# Patient Record
Sex: Female | Born: 1950 | Race: Black or African American | Hispanic: No | Smoking: Never smoker
Health system: Southern US, Community
[De-identification: ages and names within clinical notes are randomized; demographics above are authoritative.]

## PROBLEM LIST (undated history)

## (undated) DIAGNOSIS — F329 Major depressive disorder, single episode, unspecified: Secondary | ICD-10-CM

## (undated) DIAGNOSIS — Z803 Family history of malignant neoplasm of breast: Secondary | ICD-10-CM

## (undated) DIAGNOSIS — C50919 Malignant neoplasm of unspecified site of unspecified female breast: Secondary | ICD-10-CM

## (undated) DIAGNOSIS — Z800 Family history of malignant neoplasm of digestive organs: Secondary | ICD-10-CM

## (undated) DIAGNOSIS — K589 Irritable bowel syndrome without diarrhea: Secondary | ICD-10-CM

## (undated) DIAGNOSIS — T4145XA Adverse effect of unspecified anesthetic, initial encounter: Secondary | ICD-10-CM

## (undated) DIAGNOSIS — I10 Essential (primary) hypertension: Secondary | ICD-10-CM

## (undated) DIAGNOSIS — T8859XA Other complications of anesthesia, initial encounter: Secondary | ICD-10-CM

## (undated) DIAGNOSIS — E785 Hyperlipidemia, unspecified: Secondary | ICD-10-CM

## (undated) DIAGNOSIS — R002 Palpitations: Secondary | ICD-10-CM

## (undated) DIAGNOSIS — K219 Gastro-esophageal reflux disease without esophagitis: Secondary | ICD-10-CM

## (undated) DIAGNOSIS — R42 Dizziness and giddiness: Secondary | ICD-10-CM

## (undated) HISTORY — DX: Irritable bowel syndrome, unspecified: K58.9

## (undated) HISTORY — DX: Major depressive disorder, single episode, unspecified: F32.9

## (undated) HISTORY — DX: Palpitations: R00.2

## (undated) HISTORY — DX: Hyperlipidemia, unspecified: E78.5

## (undated) HISTORY — DX: Family history of malignant neoplasm of digestive organs: Z80.0

## (undated) HISTORY — PX: COLONOSCOPY: SHX174

## (undated) HISTORY — DX: Malignant neoplasm of unspecified site of unspecified female breast: C50.919

## (undated) HISTORY — PX: OTHER SURGICAL HISTORY: SHX169

## (undated) HISTORY — DX: Gastro-esophageal reflux disease without esophagitis: K21.9

## (undated) HISTORY — DX: Essential (primary) hypertension: I10

## (undated) HISTORY — PX: TONSILLECTOMY: SUR1361

## (undated) HISTORY — DX: Family history of malignant neoplasm of breast: Z80.3

## (undated) HISTORY — DX: Dizziness and giddiness: R42

---

## 1983-03-17 HISTORY — PX: ABDOMINAL HYSTERECTOMY: SHX81

## 1997-08-21 ENCOUNTER — Encounter: Admission: RE | Admit: 1997-08-21 | Discharge: 1997-11-19 | Payer: Self-pay | Admitting: *Deleted

## 1998-06-07 ENCOUNTER — Other Ambulatory Visit: Admission: RE | Admit: 1998-06-07 | Discharge: 1998-06-07 | Payer: Self-pay | Admitting: Obstetrics and Gynecology

## 1999-06-10 ENCOUNTER — Other Ambulatory Visit: Admission: RE | Admit: 1999-06-10 | Discharge: 1999-06-10 | Payer: Self-pay | Admitting: Obstetrics and Gynecology

## 2000-06-24 ENCOUNTER — Other Ambulatory Visit: Admission: RE | Admit: 2000-06-24 | Discharge: 2000-06-24 | Payer: Self-pay | Admitting: Obstetrics and Gynecology

## 2001-06-28 ENCOUNTER — Other Ambulatory Visit: Admission: RE | Admit: 2001-06-28 | Discharge: 2001-06-28 | Payer: Self-pay | Admitting: Obstetrics and Gynecology

## 2001-10-09 ENCOUNTER — Emergency Department (HOSPITAL_COMMUNITY): Admission: EM | Admit: 2001-10-09 | Discharge: 2001-10-10 | Payer: Self-pay

## 2005-03-16 DIAGNOSIS — Z923 Personal history of irradiation: Secondary | ICD-10-CM | POA: Insufficient documentation

## 2005-03-16 HISTORY — DX: Personal history of irradiation: Z92.3

## 2005-03-16 HISTORY — PX: BREAST LUMPECTOMY: SHX2

## 2005-09-29 ENCOUNTER — Ambulatory Visit: Payer: Self-pay | Admitting: Internal Medicine

## 2005-10-06 ENCOUNTER — Ambulatory Visit: Payer: Self-pay | Admitting: Gastroenterology

## 2005-10-19 ENCOUNTER — Ambulatory Visit: Payer: Self-pay | Admitting: Internal Medicine

## 2006-03-16 DIAGNOSIS — C50919 Malignant neoplasm of unspecified site of unspecified female breast: Secondary | ICD-10-CM

## 2006-03-16 HISTORY — DX: Malignant neoplasm of unspecified site of unspecified female breast: C50.919

## 2006-04-02 ENCOUNTER — Encounter (INDEPENDENT_AMBULATORY_CARE_PROVIDER_SITE_OTHER): Payer: Self-pay | Admitting: *Deleted

## 2006-04-02 ENCOUNTER — Encounter (INDEPENDENT_AMBULATORY_CARE_PROVIDER_SITE_OTHER): Payer: Self-pay | Admitting: Radiology

## 2006-04-02 ENCOUNTER — Encounter: Admission: RE | Admit: 2006-04-02 | Discharge: 2006-04-02 | Payer: Self-pay | Admitting: Family Medicine

## 2006-04-12 ENCOUNTER — Encounter: Admission: RE | Admit: 2006-04-12 | Discharge: 2006-04-12 | Payer: Self-pay | Admitting: Family Medicine

## 2006-04-26 ENCOUNTER — Encounter: Admission: RE | Admit: 2006-04-26 | Discharge: 2006-04-26 | Payer: Self-pay | Admitting: Surgery

## 2006-04-27 ENCOUNTER — Encounter (INDEPENDENT_AMBULATORY_CARE_PROVIDER_SITE_OTHER): Payer: Self-pay | Admitting: Specialist

## 2006-04-27 ENCOUNTER — Ambulatory Visit (HOSPITAL_BASED_OUTPATIENT_CLINIC_OR_DEPARTMENT_OTHER): Admission: RE | Admit: 2006-04-27 | Discharge: 2006-04-27 | Payer: Self-pay | Admitting: Surgery

## 2006-04-27 ENCOUNTER — Encounter: Admission: RE | Admit: 2006-04-27 | Discharge: 2006-04-27 | Payer: Self-pay | Admitting: Surgery

## 2006-05-24 ENCOUNTER — Ambulatory Visit: Admission: RE | Admit: 2006-05-24 | Discharge: 2006-06-14 | Payer: Self-pay | Admitting: *Deleted

## 2006-06-15 ENCOUNTER — Ambulatory Visit: Payer: Self-pay | Admitting: Oncology

## 2006-06-15 ENCOUNTER — Ambulatory Visit: Admission: RE | Admit: 2006-06-15 | Discharge: 2006-08-01 | Payer: Self-pay | Admitting: *Deleted

## 2006-06-21 LAB — CBC WITH DIFFERENTIAL/PLATELET
Eosinophils Absolute: 0.1 10*3/uL (ref 0.0–0.5)
MONO#: 0.4 10*3/uL (ref 0.1–0.9)
MONO%: 8.9 % (ref 0.0–13.0)
NEUT#: 2.6 10*3/uL (ref 1.5–6.5)
RBC: 4.44 10*6/uL (ref 3.70–5.32)
RDW: 11.9 % (ref 11.3–14.5)
WBC: 4.9 10*3/uL (ref 3.9–10.0)
lymph#: 1.7 10*3/uL (ref 0.9–3.3)

## 2006-06-21 LAB — COMPREHENSIVE METABOLIC PANEL
Albumin: 4.6 g/dL (ref 3.5–5.2)
Alkaline Phosphatase: 72 U/L (ref 39–117)
CO2: 28 mEq/L (ref 19–32)
Glucose, Bld: 95 mg/dL (ref 70–99)
Potassium: 3.8 mEq/L (ref 3.5–5.3)
Sodium: 142 mEq/L (ref 135–145)
Total Protein: 7 g/dL (ref 6.0–8.3)

## 2006-09-20 ENCOUNTER — Ambulatory Visit: Payer: Self-pay | Admitting: Oncology

## 2006-11-05 ENCOUNTER — Ambulatory Visit: Payer: Self-pay | Admitting: Oncology

## 2006-11-09 ENCOUNTER — Encounter: Admission: RE | Admit: 2006-11-09 | Discharge: 2006-11-09 | Payer: Self-pay | Admitting: Oncology

## 2007-04-04 ENCOUNTER — Encounter: Admission: RE | Admit: 2007-04-04 | Discharge: 2007-04-04 | Payer: Self-pay | Admitting: Family Medicine

## 2007-08-03 ENCOUNTER — Ambulatory Visit: Payer: Self-pay | Admitting: Oncology

## 2007-08-03 ENCOUNTER — Encounter: Payer: Self-pay | Admitting: Internal Medicine

## 2008-04-04 ENCOUNTER — Encounter: Admission: RE | Admit: 2008-04-04 | Discharge: 2008-04-04 | Payer: Self-pay | Admitting: Oncology

## 2008-07-24 ENCOUNTER — Ambulatory Visit: Payer: Self-pay | Admitting: Oncology

## 2008-07-26 LAB — CBC WITH DIFFERENTIAL/PLATELET
Basophils Absolute: 0 10*3/uL (ref 0.0–0.1)
Eosinophils Absolute: 0.1 10*3/uL (ref 0.0–0.5)
HCT: 39 % (ref 34.8–46.6)
HGB: 13.5 g/dL (ref 11.6–15.9)
MCV: 89.6 fL (ref 79.5–101.0)
MONO%: 8.9 % (ref 0.0–14.0)
NEUT#: 2.8 10*3/uL (ref 1.5–6.5)
NEUT%: 49.5 % (ref 38.4–76.8)
RDW: 11.9 % (ref 11.2–14.5)
lymph#: 2.2 10*3/uL (ref 0.9–3.3)

## 2008-07-26 LAB — COMPREHENSIVE METABOLIC PANEL
Albumin: 4.1 g/dL (ref 3.5–5.2)
BUN: 14 mg/dL (ref 6–23)
Calcium: 9.7 mg/dL (ref 8.4–10.5)
Chloride: 104 mEq/L (ref 96–112)
Glucose, Bld: 99 mg/dL (ref 70–99)
Potassium: 3.9 mEq/L (ref 3.5–5.3)

## 2008-07-27 LAB — VITAMIN D 25 HYDROXY (VIT D DEFICIENCY, FRACTURES): Vit D, 25-Hydroxy: 38 ng/mL (ref 30–89)

## 2008-08-02 ENCOUNTER — Encounter: Payer: Self-pay | Admitting: Internal Medicine

## 2009-04-05 ENCOUNTER — Encounter: Admission: RE | Admit: 2009-04-05 | Discharge: 2009-04-05 | Payer: Self-pay | Admitting: Oncology

## 2009-06-22 LAB — HM COLONOSCOPY: HM Colonoscopy: NORMAL

## 2009-07-31 ENCOUNTER — Ambulatory Visit: Payer: Self-pay | Admitting: Oncology

## 2009-08-01 LAB — COMPREHENSIVE METABOLIC PANEL
BUN: 11 mg/dL (ref 6–23)
CO2: 27 mEq/L (ref 19–32)
Calcium: 9.4 mg/dL (ref 8.4–10.5)
Creatinine, Ser: 0.86 mg/dL (ref 0.40–1.20)
Glucose, Bld: 103 mg/dL — ABNORMAL HIGH (ref 70–99)
Total Bilirubin: 0.7 mg/dL (ref 0.3–1.2)

## 2009-08-01 LAB — CBC WITH DIFFERENTIAL/PLATELET
BASO%: 0.6 % (ref 0.0–2.0)
Basophils Absolute: 0 10*3/uL (ref 0.0–0.1)
Eosinophils Absolute: 0.1 10*3/uL (ref 0.0–0.5)
HCT: 40.2 % (ref 34.8–46.6)
HGB: 13.8 g/dL (ref 11.6–15.9)
LYMPH%: 39.5 % (ref 14.0–49.7)
MCHC: 34.3 g/dL (ref 31.5–36.0)
MONO#: 0.4 10*3/uL (ref 0.1–0.9)
NEUT%: 50.2 % (ref 38.4–76.8)
Platelets: 269 10*3/uL (ref 145–400)
WBC: 5.8 10*3/uL (ref 3.9–10.3)

## 2009-08-02 LAB — VITAMIN D 25 HYDROXY (VIT D DEFICIENCY, FRACTURES): Vit D, 25-Hydroxy: 27 ng/mL — ABNORMAL LOW (ref 30–89)

## 2009-08-08 ENCOUNTER — Encounter: Payer: Self-pay | Admitting: Internal Medicine

## 2010-01-20 ENCOUNTER — Ambulatory Visit: Payer: Self-pay | Admitting: Diagnostic Radiology

## 2010-01-20 ENCOUNTER — Emergency Department (HOSPITAL_BASED_OUTPATIENT_CLINIC_OR_DEPARTMENT_OTHER): Admission: EM | Admit: 2010-01-20 | Discharge: 2010-01-20 | Payer: Self-pay | Admitting: Emergency Medicine

## 2010-04-08 ENCOUNTER — Encounter
Admission: RE | Admit: 2010-04-08 | Discharge: 2010-04-08 | Payer: Self-pay | Source: Home / Self Care | Attending: Oncology | Admitting: Oncology

## 2010-04-15 NOTE — Letter (Signed)
[facility]   Imported By: Lester [place] 09/05/2009 09:01:56  _____________________________________________________________________  External Attachment:    Type:   Image     Comment:   External Document

## 2010-05-27 LAB — BASIC METABOLIC PANEL
Calcium: 10.5 mg/dL (ref 8.4–10.5)
Creatinine, Ser: 0.9 mg/dL (ref 0.4–1.2)
GFR calc Af Amer: >60 mL/min (ref >60)
GFR calc non Af Amer: >60 mL/min (ref >60)
Sodium: 149 mEq/L — ABNORMAL HIGH (ref 135–145)

## 2010-05-27 LAB — DIFFERENTIAL
Basophils Relative: 2 % — ABNORMAL HIGH (ref 0–1)
Eosinophils Absolute: 0.1 10*3/uL (ref 0.0–0.7)
Lymphs Abs: 2.2 10*3/uL (ref 0.7–4.0)
Monocytes Relative: 8 % (ref 3–12)
Neutro Abs: 3.1 10*3/uL (ref 1.7–7.7)
Neutrophils Relative %: 52 % (ref 43–77)

## 2010-05-27 LAB — POCT CARDIAC MARKERS
CKMB, poc: <1 ng/mL — ABNORMAL LOW (ref 1.0–8.0)
Myoglobin, poc: 69 ng/mL (ref 12–200)

## 2010-05-27 LAB — CBC
Hemoglobin: 14.9 g/dL (ref 12.0–15.0)
Platelets: 259 10*3/uL (ref 150–400)
RBC: 4.75 MIL/uL (ref 3.87–5.11)

## 2010-05-27 LAB — GLUCOSE, CAPILLARY: Glucose-Capillary: 124 mg/dL — ABNORMAL HIGH (ref 70–99)

## 2010-07-22 ENCOUNTER — Encounter (INDEPENDENT_AMBULATORY_CARE_PROVIDER_SITE_OTHER): Payer: Self-pay | Admitting: Surgery

## 2010-07-28 ENCOUNTER — Other Ambulatory Visit: Payer: Self-pay | Admitting: Oncology

## 2010-07-28 ENCOUNTER — Encounter (HOSPITAL_BASED_OUTPATIENT_CLINIC_OR_DEPARTMENT_OTHER): Payer: Managed Care, Other (non HMO) | Admitting: Oncology

## 2010-07-28 DIAGNOSIS — C50519 Malignant neoplasm of lower-outer quadrant of unspecified female breast: Secondary | ICD-10-CM

## 2010-07-28 DIAGNOSIS — Z170 Estrogen receptor positive status [ER+]: Secondary | ICD-10-CM

## 2010-07-28 LAB — CBC WITH DIFFERENTIAL/PLATELET
BASO%: 1.9 % (ref 0.0–2.0)
Eosinophils Absolute: 0.2 10*3/uL (ref 0.0–0.5)
MCHC: 33.3 g/dL (ref 31.5–36.0)
MCV: 91.3 fL (ref 79.5–101.0)
MONO#: 0.5 10*3/uL (ref 0.1–0.9)
MONO%: 8.2 % (ref 0.0–14.0)
NEUT#: 2.8 10*3/uL (ref 1.5–6.5)
RBC: 4.72 10*6/uL (ref 3.70–5.45)
RDW: 12.5 % (ref 11.2–14.5)
WBC: 5.7 10*3/uL (ref 3.9–10.3)

## 2010-07-28 LAB — COMPREHENSIVE METABOLIC PANEL
ALT: 21 U/L (ref 0–35)
Albumin: 4.3 g/dL (ref 3.5–5.2)
Alkaline Phosphatase: 69 U/L (ref 39–117)
Glucose, Bld: 94 mg/dL (ref 70–99)
Potassium: 3.6 mEq/L (ref 3.5–5.3)
Sodium: 140 mEq/L (ref 135–145)
Total Protein: 7.2 g/dL (ref 6.0–8.3)

## 2010-07-29 LAB — VITAMIN D 25 HYDROXY (VIT D DEFICIENCY, FRACTURES): Vit D, 25-Hydroxy: 29 ng/mL — ABNORMAL LOW (ref 30–89)

## 2010-08-01 NOTE — Procedures (Signed)
[facility]                                 ULTRASOUND STUDY   NAME:Katelyn Porter, Katelyn Porter                    MRN:          [id number]  DATE:10/06/2005                            DOB:          05-11-1950    Accession No. [id number].   ORDERING PHYSICIAN:  Wilhemina Bonito. Marina Goodell, MD   READING PHYSICIAN:  Malcom T. Russella Dar, MD, [facility]   PROCEDURE:  Multiplanar abdominal ultrasound imaging was performed in the  upright, supine, right and left lateral decubitus positions.   RESULTS:  Abdominal aorta 1.4 cm x 1.5 cm.  The IVC is patent.   The pancreas appears normal throughout the head, body and tail without  evidence of ductal dilatation, pancreatic masses, or peripancreatic  inflammation.   Gallbladder is well distended, thin walled, with no pericholecystic fluid or  intraluminal echogenic foci to suggest gallstone disease.  Wall thickness  1.6 mm.   The common bile duct measures 3.7 mm in maximal diameter without evidence of  intraluminal foci.   The liver appears normal without evidence of parenchymal lesion, ductal  dilatation or vascular abnormality.   Kidneys are normal in appearance.  The right kidney measures 6.3 cm.  The  left kidney measures 8.9 cm.   Spleen is normal in size, measuring 9 cm without parenchymal lesion.   IMPRESSION:  No abnormalities noted.   RECOMMENDATIONS:  Per Dr. Yancey Flemings.                                   Venita Lick. Pleas Koch., MD, [facility]   MTS/MedQ  DD:  10/06/2005  DT:  10/06/2005  Job #:  [id number]

## 2010-08-01 NOTE — Op Note (Signed)
[name], Katelyn Porter                   ACCOUNT NO.:  [id number]   MEDICAL RECORD NO.:  [id number]          PATIENT TYPE:  AMB   LOCATION:  DSC                          FACILITY:  [facility]   PHYSICIAN:  Thomas A. Cornett, M.D.DATE OF BIRTH:  1950-12-15   DATE OF PROCEDURE:  04/27/2006  DATE OF DISCHARGE:                               OPERATIVE REPORT   PREOPERATIVE DIAGNOSIS:  Left breast ductal carcinoma in situ.   POSTOPERATIVE DIAGNOSIS:  Left breast ductal carcinoma in situ.   PROCEDURE:  Left breast needle-localized excisional lumpectomy.   SURGEON:  Harriette Bouillon, M.D.   ANESTHESIA:  MAC with 0.25%  Sensorcaine with epinephrine.   ESTIMATED BLOOD LOSS:  20 mL.   SPECIMEN:  Left breast tissue with wire and clip to pathology plus  additional tissue corresponds to superior, inferior, medial and lateral  and deep margins.   DRAINS:  None.   INDICATIONS FOR PROCEDURE:  The patient is a 60 year old female found to  have left breast DCIS by mammography and core biopsy.  She elected to  undergo breast conserving therapy and presents today for left breast  needle-localized excisional biopsy.   DESCRIPTION OF PROCEDURE:  The patient was brought to the operating room  after undergoing left breast needle localization.  Left breast was then  prepped and draped in sterile fashion.  The wire was trimmed.  Local  anesthesia was injected and a curvilinear incision was made in the left  outer upper quadrant where the wire was.  The wire was grasped and then  pulled out through the incision.  The tissue around the wire was excised  with scalpel blade.  The entire specimen was oriented and sent to  radiology and verified to be adequate by the radiologist with clip and  wire in the specimen.  I then excised additional tissue corresponding to  superior, inferior, medial, lateral and deep margins.  I did not excise  any superficial tissue since this was quite thin up on the skin.  Hemostasis was  achieved with cautery.  The wound was closed in layers  with 3-0 Vicryl for a deep layer and 4-0 Monocryl for  subcuticular stitch.  Steri-Strips, dry dressings were applied.  All  final counts of sponge, needle and instruments were found to be correct  at this portion of the case.  The patient was then awoke, taken to  recovery in satisfactory condition.      Thomas A. Cornett, M.D.  Electronically Signed     TAC/MEDQ  D:  04/27/2006  T:  04/27/2006  Job:  [id number]   cc:   Rolan Bucco L. Kearney Hard, M.D.  Dr. Riley Nearing

## 2010-08-01 NOTE — Assessment & Plan Note (Signed)
[facility]                           GASTROENTEROLOGY OFFICE NOTE   NAME:Katelyn Porter, Katelyn Porter                    MRN:          [id number]  DATE:09/29/2005                            DOB:          25-Dec-1950    Patient is self-referred.  .   REASON FOR CONSULTATION:  Abdominal pain and nausea.   HISTORY:  This is a 60 year old African-American female with recently  diagnosed hyperlipidemia who refers herself for evaluation of abdominal pain  and nausea.  The patient reports a 3-4 month history of awakening with sharp  epigastric pain.  She states that when she steps onto the floor in the  morning, she develops sharp pain that takes her breath away.  It may radiate  to the right upper quadrant.  The pain occurs nearly every morning and only  in the morning and lasts for no more than 30 seconds.  It does not occur  more than once per day.  There are no obvious relieving or exacerbating  factors.  She also reports increased bloating and gas over the same time  frame as well as intermittent nausea.  She has had a 15 pound weight gain  over the past year, occasional heartburn for which she takes Pepcid, though  no dysphagia.  No melena or hematochezia.  She has not had prior history of  GI problems or GI evaluations.  She did see Dr. Riley Nearing recently, who  prescribed Nexium, which the patient took every other day for two weeks  without change in symptoms.  She did have laboratories September 09, 2005.  CBC,  comprehensive metabolic panel, urinalysis, and thyroid testing were normal.  Her cholesterol was elevated.   PAST MEDICAL HISTORY:  Hyperlipidemia.   PAST SURGICAL HISTORY:  Hysterectomy.  Ovaries present.   ALLERGIES:  SULFA.   CURRENT MEDICATIONS:  1.  Simvastatin 20 mg daily.  2.  Estradiol 2 mg daily.  3.  Nexium 40 mg every other day.   FAMILY HISTORY:  Mother recently diagnosed with colon cancer at age 20, as  well.  First cousin diagnosed  with colon cancer at age 74.  Mother and  brother with diabetes.   SOCIAL HISTORY:  Patient is married without children.  She lives with her  husband.  She works in Civil Service fast streamer for [employer].  She  does not smoke and rarely uses alcohol.   REVIEW OF SYSTEMS:  Per diagnostic evaluation form.   PHYSICAL EXAMINATION:  VITAL SIGNS:  Blood pressure is 112/60.  Heart rate  64.  Weight is 172 pounds.  She is 5 feet 4 inches in height.  GENERAL: A well-appearing female in no acute distress.  HEENT:  Sclerae are anicteric.  Conjunctivae are pink.  Oral mucosa intact.  NECK:  No adenopathy.  LUNGS:  Clear.  HEART:  Regular.  ABDOMEN:  Obese and soft without tenderness, mass, or hernia.  Good bowel  sounds heard.  No organomegaly.  EXTREMITIES:  Without edema.   IMPRESSION:  1.  This is a 59 year old female who presents with fleeting epigastric      discomfort as  well as recent problems with bloating, gas, and nausea.      Rule out pain related to intestinal gas, rule out reflux disease, rule      out gallbladder disease, rule out musculoskeletal or rib catch syndrome.  2.  Gastroesophageal reflux disease.  3.  Weight gain.  4.  Family history of colon cancer in a first and second degree relative.   RECOMMENDATIONS:  1.  Abdominal ultrasound.  2.  Anti-gas and flatulence dietary sheet as well as gas information      brochure.  3.  Weight loss.  4.  Colonoscopy with polypectomy if necessary.  The nature of the procedure      as well as the risks, benefits and alternatives have been reviewed.  She      understood and agreed to proceed.  5.  Ongoing general medical care with Dr. Riley Nearing.                                   Wilhemina Bonito. Eda Keys., MD   JNP/MedQ  DD:  09/29/2005  DT:  09/29/2005  Job #:  [id number]   cc:   Bernadette Hoit, M.D.

## 2010-08-05 ENCOUNTER — Encounter (HOSPITAL_BASED_OUTPATIENT_CLINIC_OR_DEPARTMENT_OTHER): Payer: Managed Care, Other (non HMO) | Admitting: Oncology

## 2010-08-05 DIAGNOSIS — Z170 Estrogen receptor positive status [ER+]: Secondary | ICD-10-CM

## 2010-08-05 DIAGNOSIS — D0590 Unspecified type of carcinoma in situ of unspecified breast: Secondary | ICD-10-CM

## 2011-03-05 ENCOUNTER — Other Ambulatory Visit: Payer: Self-pay | Admitting: Obstetrics & Gynecology

## 2011-03-05 DIAGNOSIS — Z1231 Encounter for screening mammogram for malignant neoplasm of breast: Secondary | ICD-10-CM

## 2011-04-10 ENCOUNTER — Ambulatory Visit: Payer: Managed Care, Other (non HMO)

## 2011-04-10 ENCOUNTER — Ambulatory Visit
Admission: RE | Admit: 2011-04-10 | Discharge: 2011-04-10 | Disposition: A | Discharge status: Home / Self Care | Payer: Managed Care, Other (non HMO) | Source: Ambulatory Visit | Attending: Obstetrics & Gynecology | Admitting: Obstetrics & Gynecology

## 2011-04-10 ENCOUNTER — Other Ambulatory Visit: Payer: Self-pay | Admitting: Obstetrics & Gynecology

## 2011-04-10 DIAGNOSIS — Z853 Personal history of malignant neoplasm of breast: Secondary | ICD-10-CM

## 2011-04-10 DIAGNOSIS — Z1231 Encounter for screening mammogram for malignant neoplasm of breast: Secondary | ICD-10-CM

## 2011-04-28 ENCOUNTER — Other Ambulatory Visit: Payer: Managed Care, Other (non HMO) | Admitting: Lab

## 2011-05-05 ENCOUNTER — Ambulatory Visit: Payer: Managed Care, Other (non HMO) | Admitting: Oncology

## 2011-06-01 ENCOUNTER — Other Ambulatory Visit (HOSPITAL_BASED_OUTPATIENT_CLINIC_OR_DEPARTMENT_OTHER): Payer: Managed Care, Other (non HMO)

## 2011-06-01 DIAGNOSIS — D0590 Unspecified type of carcinoma in situ of unspecified breast: Secondary | ICD-10-CM

## 2011-06-01 DIAGNOSIS — Z170 Estrogen receptor positive status [ER+]: Secondary | ICD-10-CM

## 2011-06-01 LAB — CBC WITH DIFFERENTIAL/PLATELET
Basophils Absolute: 0.1 10*3/uL (ref 0.0–0.1)
Eosinophils Absolute: 0.1 10*3/uL (ref 0.0–0.5)
HGB: 13.1 g/dL (ref 11.6–15.9)
MONO#: 0.5 10*3/uL (ref 0.1–0.9)
NEUT#: 2.4 10*3/uL (ref 1.5–6.5)
RBC: 4.31 10*6/uL (ref 3.70–5.45)
RDW: 12.3 % (ref 11.2–14.5)
WBC: 5.3 10*3/uL (ref 3.9–10.3)
lymph#: 2.1 10*3/uL (ref 0.9–3.3)

## 2011-06-01 LAB — COMPREHENSIVE METABOLIC PANEL
AST: 19 U/L (ref 0–37)
Albumin: 4.4 g/dL (ref 3.5–5.2)
BUN: 24 mg/dL — ABNORMAL HIGH (ref 6–23)
Calcium: 9.3 mg/dL (ref 8.4–10.5)
Chloride: 105 mEq/L (ref 96–112)
Glucose, Bld: 90 mg/dL (ref 70–99)
Potassium: 3.7 mEq/L (ref 3.5–5.3)
Sodium: 143 mEq/L (ref 135–145)
Total Protein: 6.2 g/dL (ref 6.0–8.3)

## 2011-06-04 ENCOUNTER — Ambulatory Visit (INDEPENDENT_AMBULATORY_CARE_PROVIDER_SITE_OTHER): Payer: Managed Care, Other (non HMO) | Admitting: Surgery

## 2011-06-04 ENCOUNTER — Encounter (INDEPENDENT_AMBULATORY_CARE_PROVIDER_SITE_OTHER): Payer: Self-pay | Admitting: Surgery

## 2011-06-04 VITALS — BP 116/72 | HR 64 | Ht 64.0 in | Wt 188.6 lb

## 2011-06-04 DIAGNOSIS — Z853 Personal history of malignant neoplasm of breast: Secondary | ICD-10-CM

## 2011-06-04 NOTE — Patient Instructions (Signed)
Congratulations! No further follow up needed.  Continue yearly mammograms.

## 2011-06-04 NOTE — Progress Notes (Signed)
NAME: [name]       DOB: 1950-10-16           DATE: 06/04/2011       MRN: [id number]   Katelyn Porter is a 61 y.o.Marland Kitchenfemale who presents for routine followup of her Left breast DCIS diagnosed in 2008 and treated with LUMPECTOMY ,  RADIATION AND TAMOXIFEN . She has no problems or concerns on either side.  PFSH: She has had no significant changes since the last visit here.  ROS: There have been no significant changes since the last visit here  EXAM: General: The patient is alert, oriented, generally healty appearing, NAD. Mood and affect are normal.  Breasts:  Left breast with post surgical changes.  No mass.Right breast normal  Lymphatics: She has no axillary or supraclavicular adenopathy on either side.  Extremities: Full ROM of the surgical side with no lymphedema noted.  Data Reviewed: Mammogram1/2013  BIRADS 2   Impression: Doing well, with no evidence of recurrent cancer or new cancer  Plan: Will continue to follow up prn.

## 2011-06-08 ENCOUNTER — Ambulatory Visit (HOSPITAL_BASED_OUTPATIENT_CLINIC_OR_DEPARTMENT_OTHER): Payer: Managed Care, Other (non HMO) | Admitting: Oncology

## 2011-06-08 VITALS — BP 133/77 | HR 86 | Temp 98.8°F | Ht 64.0 in | Wt 188.5 lb

## 2011-06-08 DIAGNOSIS — Z853 Personal history of malignant neoplasm of breast: Secondary | ICD-10-CM | POA: Insufficient documentation

## 2011-06-08 DIAGNOSIS — C50919 Malignant neoplasm of unspecified site of unspecified female breast: Secondary | ICD-10-CM

## 2011-06-08 NOTE — Progress Notes (Signed)
ID: Katelyn Porter   DOB: August 29, 1950  MR#: [id number]  YNW#:[id number]  HISTORY OF PRESENT ILLNESS: Katelyn Porter had a screening mammogram at [facility] March 24, 2006 which showed some microcalcifications in the left breast.  Diagnostic left mammogram on January 18 showed linear and branching characteristics suspicious for DCIS and biopsy was performed stereotactically the same day.  The pathology ([id number] and [id number]) showed a high-grade ductal carcinoma in situ which was strongly estrogen receptor positive at 96% and progesterone receptor positive at 85%.  It is difficult to tell from this biopsy how long the actual cancer was; the longest core measured 3.6 cm, but there were only patchy foci of high-grade DCIS in the sample.  With this information, the patient was referred to Dr. Luisa Hart and on January 28 underwent bilateral breast MRIs.  This showed only post-biopsy change in the lateral portion of the left breast with no other suspicious areas in either breast and, accordingly, on April 27, 2006, Dr. Luisa Hart proceeded to left breast needle localization lumpectomy.  The final pathology from that procedure ([id number]) showed no residual cancer in the sample.   INTERVAL HISTORY: Katelyn Porter lost her 61 year old mother February of this year and then her brother died earlier this month from a heart attack. She is actively and appropriately grieving these losses.  REVIEW OF SYSTEMS: From a breast cancer point of view, she is doing "pretty good". She still has hot flashes which she says "last all day". She is otherwise tolerating the tamoxifen with no unusual side effects She exercises mostly by walking, but in cold weather days she has a treadmill at home that she can use. She has had no unusual headaches, visual changes, cough, phlegm production, pleurisy, shortness of breath, chest pain or pressure, or change in bowel or bladder habits. A detailed review of systems is noncontributory  PAST MEDICAL  HISTORY: Past Medical History  Diagnosis Date  . Cancer   . Hypertension   . Hyperlipidemia   The past medical history is otherwise significant for hysterectomy performed in the late 1980s for fibroids.  No salpingo-oophorectomy was performed.  She is also status post tonsillectomy and adenoidectomy.  Aside the past medical history is entirely negative.  PAST SURGICAL HISTORY: Past Surgical History  Procedure Date  . Breast lumpectomy 2007  . Abdominal hysterectomy     FAMILY HISTORY Family History  Problem Relation Age of Onset  . Breast cancer    . Colon cancer    . Lung cancer Father   The patient's father had a history of tongue cancer which apparently spread to his lungs from the patient's account.  He died from myocardial infarction, however.  The patient's mother died at age 36.  The patient had 1 brother who died from an MI; and 1 sister, A&W.  There is some diabetes, emphysema and hypertension in the family, but no other breast cancer or ovarian cancer.  GYNECOLOGIC HISTORY: She is G0.  Of course, she has not had any periods since her hysterectomy.  She is still having significant hot flashes.  SOCIAL HISTORY: She works in Civil Service fast streamer for [employer].  Her husband of 15 years, [name], works in Editor, commissioning.  He has 2 children from a prior marriage, a daughter [name], 55, who works for [name] in [place].,, and a son, 61, [name], who lives in [place]. The patient has 3 stepgrandchildren but "I don't see them often". She attends [employer].      HEALTH MAINTENANCE:  History  Substance Use Topics  . Smoking status: Never Smoker   . Smokeless tobacco: Not on file  . Alcohol Use: No     Colonoscopy: 2008/[facility]  PAP: UTD  Bone density: 2007/[facility]/"normal"  Allergies  Allergen Reactions  . Sulfa Drugs Cross Reactors     Current Outpatient Prescriptions  Medication Sig Dispense Refill  . Cholecalciferol (VITAMIN D3) 1000 UNITS CAPS Take by  mouth.        . citalopram (CELEXA) 40 MG tablet       . Multiple Vitamin (MULTIVITAMIN) tablet Take 1 tablet by mouth daily.        . pravastatin (PRAVACHOL) 20 MG tablet Take 20 mg by mouth daily.        Marland Kitchen triamterene-hydrochlorothiazide (DYAZIDE) 37.5-25 MG per capsule Take 1 capsule by mouth every morning.        Marland Kitchen TRAMADOL HCL PO Take 20 mg by mouth daily.          OBJECTIVE: Middle-aged [place] American woman who appears well Filed Vitals:   06/08/11 1626  BP: 133/77  Pulse: 86  Temp: 98.8 F (37.1 C)     Body mass index is 32.36 kg/(m^2).    ECOG FS: 0  Sclerae unicteric Oropharynx clear No peripheral adenopathy Lungs no rales or rhonchi Heart regular rate and rhythm Abd benign MSK no focal spinal tenderness, no peripheral edema Neuro: nonfocal Breasts: The right breast is unremarkable; the left breast status post lumpectomy and radiation; there is some residual hyperpigmentation and firmness, but no evidence of local recurrence  LAB RESULTS: Lab Results  Component Value Date   WBC 5.3 06/01/2011   NEUTROABS 2.4 06/01/2011   HGB 13.1 06/01/2011   HCT 39.0 06/01/2011   MCV 90.4 06/01/2011   PLT 269 06/01/2011      Chemistry      Component Value Date/Time   NA 143 06/01/2011 1353   K 3.7 06/01/2011 1353   CL 105 06/01/2011 1353   CO2 24 06/01/2011 1353   BUN 24* 06/01/2011 1353   CREATININE 0.66 06/01/2011 1353      Component Value Date/Time   CALCIUM 9.3 06/01/2011 1353   ALKPHOS 75 06/01/2011 1353   AST 19 06/01/2011 1353   ALT 16 06/01/2011 1353   BILITOT 0.4 06/01/2011 1353       Lab Results  Component Value Date   LABCA2 30 07/28/2010   LABCA2 30 07/28/2010    No components found with this basename: [id number]    No results found for this basename: INR:1;PROTIME:1 in the last 168 hours  Urinalysis No results found for this basename: colorurine, appearanceur, labspec, phurine, glucoseu, hgbur, bilirubinur, ketonesur, proteinur, urobilinogen, nitrite,  leukocytesur    STUDIES: Clinical Data: Annual examination. History of left breast cancer,  diagnosed in 2008. The patient is asymptomatic.  DIGITAL DIAGNOSTIC BILATERAL MAMMOGRAM WITH CAD  Comparison: With priors  Findings: The breast parenchyma is heterogeneously dense  bilaterally. There are lumpectomy changes in the outer left breast  posteriorly. No mass, nonsurgical distortion, or suspicious  microcalcification is identified to suggest malignancy.  Mammographic images were processed with CAD.  IMPRESSION:  No evidence of malignancy in either breast. Lumpectomy changes on  the left. Bilateral diagnostic mammogram in 1 year is recommended.    ASSESSMENT: 61 year old [place] woman status post left lumpectomy February 2008 for a grade 3 ductal carcinoma in situ which was strongly estrogen and progesterone receptor positive.  She had radiation completed May 2008 and has been on  tamoxifen since July 2008 with good tolerance.   PLAN: We went over his situation in detail and she understands she had a noninvasive breast cancer. She has decrease the risk of its recurrence as well as the risk of a new breast cancer developing by taking tamoxifen for 5 years. At this point I am very comfortable releasing her from our followup and she is comfortable with that decision as well. As far as breast cancer screening is concerned all she will need is yearly mammography and a yearly physician breast exam. She does understand that she has dense breasts, and that these can make mammograms less sensitive. I have encouraged her to develop  breast self awareness and let her physicians know if she notices any change in either breast in the future.   Croy Drumwright C    06/08/2011

## 2011-12-16 ENCOUNTER — Encounter: Payer: Self-pay | Admitting: Internal Medicine

## 2012-01-18 ENCOUNTER — Encounter: Payer: Self-pay | Admitting: Cardiovascular Disease

## 2012-02-02 ENCOUNTER — Encounter: Payer: Self-pay | Admitting: *Deleted

## 2012-02-02 DIAGNOSIS — R002 Palpitations: Secondary | ICD-10-CM | POA: Insufficient documentation

## 2012-02-03 ENCOUNTER — Ambulatory Visit (INDEPENDENT_AMBULATORY_CARE_PROVIDER_SITE_OTHER): Payer: Managed Care, Other (non HMO) | Admitting: Cardiovascular Disease

## 2012-02-03 VITALS — BP 132/72 | HR 68 | Ht 64.0 in | Wt 196.0 lb

## 2012-02-03 DIAGNOSIS — I10 Essential (primary) hypertension: Secondary | ICD-10-CM

## 2012-02-03 DIAGNOSIS — C50919 Malignant neoplasm of unspecified site of unspecified female breast: Secondary | ICD-10-CM

## 2012-02-03 DIAGNOSIS — E785 Hyperlipidemia, unspecified: Secondary | ICD-10-CM

## 2012-02-03 DIAGNOSIS — R002 Palpitations: Secondary | ICD-10-CM

## 2012-02-03 NOTE — Assessment & Plan Note (Signed)
F/U Katelyn Porter S/P lumpectomy with good prognosis

## 2012-02-03 NOTE — Assessment & Plan Note (Signed)
Well controlled.  Continue current medications and low sodium Dash type diet.    

## 2012-02-03 NOTE — Patient Instructions (Signed)
Your physician recommends that you schedule a follow-up appointment in:  AS NEEDED  Your physician recommends that you continue on your current medications as directed. Please refer to the Current Medication list given to you today.  Your physician has requested that you have a stress echocardiogram. For further information please visit [email or web address]. Please follow instruction sheet as given. DX 785.1  

## 2012-02-03 NOTE — Progress Notes (Signed)
Patient ID: Katelyn Porter, female   DOB: Apr 12, 1950, 61 y.o.   MRN: [id number] 61 yo referred by Dr Darnelle Catalan for palpitations.  Brother died of MI last year CRF HTN and elevated lipids on Rx 3 weeks ago had a few days of raid palpitations.  Felt heart fluttering fast. No chest pain dyspnea or presyncope.  Improved now and actually better with activity.  Discussed weight loss and low carb diet.  She seems somewhat motivated to take better care of herself.  Compliant with meds.  No history of CAD, valve disease or CHF  She thinks Dr Hyacinth Meeker checked her thyroid this year  ROS: Denies fever, malais, weight loss, blurry vision, decreased visual acuity, cough, sputum, SOB, hemoptysis, pleuritic pain, palpitaitons, heartburn, abdominal pain, melena, lower extremity edema, claudication, or rash.  All other systems reviewed and negative   General: Affect appropriate Overweight black female HEENT: normal Neck supple with no adenopathy JVP normal no bruits no thyromegaly Lungs clear with no wheezing and good diaphragmatic motion Heart:  S1/S2 no murmur,rub, gallop or click PMI normal Abdomen: benighn, BS positve, no tenderness, no AAA no bruit.  No HSM or HJR Distal pulses intact with no bruits No edema Neuro non-focal Skin warm and dry No muscular weakness  Medications Current Outpatient Prescriptions  Medication Sig Dispense Refill  . Cholecalciferol (VITAMIN D3) 1000 UNITS CAPS Take 1 capsule by mouth daily.       . citalopram (CELEXA) 40 MG tablet Take 40 mg by mouth daily.       . Multiple Vitamin (MULTIVITAMIN) tablet Take 1 tablet by mouth daily.        . pravastatin (PRAVACHOL) 20 MG tablet Take 20 mg by mouth daily.        Marland Kitchen triamterene-hydrochlorothiazide (DYAZIDE) 37.5-25 MG per capsule Take 1 capsule by mouth every morning.          Allergies Sulfa drugs cross reactors  Family History: Family History  Problem Relation Age of Onset  . Breast cancer    . Colon cancer    . Lung  cancer Father     Social History: History   Social History  . Marital Status: Married    Spouse Name: N/A    Number of Children: N/A  . Years of Education: N/A   Occupational History  . Not on file.   Social History Main Topics  . Smoking status: Never Smoker   . Smokeless tobacco: Not on file  . Alcohol Use: No  . Drug Use: No  . Sexually Active:    Other Topics Concern  . Not on file   Social History Narrative  . No narrative on file    Electrocardiogram:  NSR rate 68 normal  Assessment and Plan

## 2012-02-03 NOTE — Assessment & Plan Note (Signed)
Cholesterol is at goal.  Continue current dose of statin and diet Rx.  No myalgias or side effects.  F/U  LFT's in 6 months. No results found for this basename: LDLCALC             

## 2012-02-03 NOTE — Assessment & Plan Note (Signed)
F/U stress echo to R/O exercise induced arrythmias and structural heart disease.  No need for event monitor at this time since symptoms improved.  Thyroid labs per primary

## 2012-02-12 ENCOUNTER — Encounter: Payer: Self-pay | Admitting: Cardiovascular Disease

## 2012-02-12 NOTE — Addendum Note (Signed)
Addended by: Burnett Kanaris A on: 02/12/2012 01:38 PM   Modules accepted: Orders

## 2012-02-26 ENCOUNTER — Ambulatory Visit (HOSPITAL_COMMUNITY): Payer: Managed Care, Other (non HMO) | Attending: Cardiology | Admitting: Radiology

## 2012-02-26 ENCOUNTER — Encounter: Payer: Self-pay | Admitting: Cardiovascular Disease

## 2012-02-26 ENCOUNTER — Ambulatory Visit (HOSPITAL_COMMUNITY): Payer: Managed Care, Other (non HMO) | Attending: Cardiology

## 2012-02-26 DIAGNOSIS — I10 Essential (primary) hypertension: Secondary | ICD-10-CM | POA: Insufficient documentation

## 2012-02-26 DIAGNOSIS — E785 Hyperlipidemia, unspecified: Secondary | ICD-10-CM | POA: Insufficient documentation

## 2012-02-26 DIAGNOSIS — R072 Precordial pain: Secondary | ICD-10-CM

## 2012-02-26 DIAGNOSIS — R0989 Other specified symptoms and signs involving the circulatory and respiratory systems: Secondary | ICD-10-CM

## 2012-02-26 DIAGNOSIS — Z8249 Family history of ischemic heart disease and other diseases of the circulatory system: Secondary | ICD-10-CM | POA: Insufficient documentation

## 2012-02-26 DIAGNOSIS — R002 Palpitations: Secondary | ICD-10-CM | POA: Insufficient documentation

## 2012-02-26 NOTE — Progress Notes (Signed)
Stress Echocardiogram performed.  

## 2012-04-19 ENCOUNTER — Other Ambulatory Visit: Payer: Self-pay | Admitting: Obstetrics & Gynecology

## 2012-04-19 DIAGNOSIS — Z9889 Other specified postprocedural states: Secondary | ICD-10-CM

## 2012-04-19 DIAGNOSIS — Z853 Personal history of malignant neoplasm of breast: Secondary | ICD-10-CM

## 2012-04-29 ENCOUNTER — Ambulatory Visit: Payer: Managed Care, Other (non HMO) | Admitting: Family

## 2012-05-02 ENCOUNTER — Ambulatory Visit
Admission: RE | Admit: 2012-05-02 | Discharge: 2012-05-02 | Disposition: A | Discharge status: Home / Self Care | Payer: Self-pay | Source: Ambulatory Visit | Attending: Obstetrics & Gynecology | Admitting: Obstetrics & Gynecology

## 2012-05-02 DIAGNOSIS — Z9889 Other specified postprocedural states: Secondary | ICD-10-CM

## 2012-05-02 DIAGNOSIS — Z853 Personal history of malignant neoplasm of breast: Secondary | ICD-10-CM

## 2012-05-02 LAB — HM MAMMOGRAPHY: HM Mammogram: NORMAL

## 2012-05-10 ENCOUNTER — Ambulatory Visit (INDEPENDENT_AMBULATORY_CARE_PROVIDER_SITE_OTHER): Payer: BC Managed Care – PPO | Admitting: Family

## 2012-05-10 ENCOUNTER — Encounter: Payer: Self-pay | Admitting: Family

## 2012-05-10 VITALS — BP 110/70 | HR 82 | Temp 97.7°F | Resp 16 | Ht 63.5 in | Wt 197.1 lb

## 2012-05-10 DIAGNOSIS — K5900 Constipation, unspecified: Secondary | ICD-10-CM

## 2012-05-10 DIAGNOSIS — R5381 Other malaise: Secondary | ICD-10-CM

## 2012-05-10 DIAGNOSIS — R232 Flushing: Secondary | ICD-10-CM | POA: Insufficient documentation

## 2012-05-10 DIAGNOSIS — N951 Menopausal and female climacteric states: Secondary | ICD-10-CM

## 2012-05-10 DIAGNOSIS — I10 Essential (primary) hypertension: Secondary | ICD-10-CM

## 2012-05-10 DIAGNOSIS — R109 Unspecified abdominal pain: Secondary | ICD-10-CM

## 2012-05-10 DIAGNOSIS — C50912 Malignant neoplasm of unspecified site of left female breast: Secondary | ICD-10-CM

## 2012-05-10 DIAGNOSIS — E785 Hyperlipidemia, unspecified: Secondary | ICD-10-CM

## 2012-05-10 DIAGNOSIS — C50919 Malignant neoplasm of unspecified site of unspecified female breast: Secondary | ICD-10-CM

## 2012-05-10 HISTORY — DX: Constipation, unspecified: K59.00

## 2012-05-10 LAB — CBC WITH DIFFERENTIAL/PLATELET
Basophils Relative: 0 % (ref 0–1)
HCT: 41.9 % (ref 36.0–46.0)
Hemoglobin: 14.3 g/dL (ref 12.0–15.0)
Lymphocytes Relative: 42 % (ref 12–46)
Lymphs Abs: 3 10*3/uL (ref 0.7–4.0)
Monocytes Absolute: 0.6 10*3/uL (ref 0.1–1.0)
Monocytes Relative: 8 % (ref 3–12)
Neutro Abs: 3.2 10*3/uL (ref 1.7–7.7)
Neutrophils Relative %: 47 % (ref 43–77)
RBC: 4.82 MIL/uL (ref 3.87–5.11)
WBC: 7 10*3/uL (ref 4.0–10.5)

## 2012-05-10 LAB — HEPATIC FUNCTION PANEL
Albumin: 4.5 g/dL (ref 3.5–5.2)
Alkaline Phosphatase: 84 U/L (ref 39–117)
Total Bilirubin: 0.7 mg/dL (ref 0.3–1.2)
Total Protein: 7 g/dL (ref 6.0–8.3)

## 2012-05-10 LAB — BASIC METABOLIC PANEL
CO2: 28 mEq/L (ref 19–32)
Calcium: 9.9 mg/dL (ref 8.4–10.5)
Creat: 0.89 mg/dL (ref 0.50–1.10)
Glucose, Bld: 88 mg/dL (ref 70–99)
Sodium: 138 mEq/L (ref 135–145)

## 2012-05-10 LAB — POCT URINALYSIS DIPSTICK
Blood, UA: NEGATIVE
Glucose, UA: NEGATIVE
Nitrite, UA: NEGATIVE
Protein, UA: NEGATIVE
Spec Grav, UA: 1.02
Urobilinogen, UA: 0.2

## 2012-05-10 NOTE — Assessment & Plan Note (Signed)
Obtain CBC/TSH

## 2012-05-10 NOTE — Assessment & Plan Note (Signed)
Continue citalopram. Not an estrogen candidate due to breast cancer hx.

## 2012-05-10 NOTE — Assessment & Plan Note (Signed)
BP stable on dyazide. Will obtain bmet.

## 2012-05-10 NOTE — Progress Notes (Signed)
Subjective:    Patient ID: Katelyn Porter, female    DOB: [birth year], 62 y.o.   MRN: [id number]  HPI  Katelyn Porter is a 62 yr old female who presents today to establish care.    Hot flashes/fatigue- Symptoms started in 2008 when she stopped estrogen due to breast cancer dx.  She is using citalopram which helps some.   Reports som lower abdominal tenderness for 2 weeks. Similar symptoms in the past with UTI. Denies dysuria. Hx of frequent UTI's and has seen urologist ([facility])  Breast cancer- treated in 2007. Had lumpectomy- left breast. Followed by radiation.  She was treated by Dr. Darnelle Catalan who released her after 5 yrs.  Last mammogram was 2/17- normal per pt.     HTN- pt is currently maintained on dyazide.  Hyperlipidemia- on pravastatin 20.  She denies myalgia.     Review of Systems  Constitutional:       Reports that she has gained 25 pounds in the last year  HENT: Negative for hearing loss.   Eyes: Negative for visual disturbance.  Respiratory: Negative for cough.   Cardiovascular: Negative for chest pain and leg swelling.  Gastrointestinal: Positive for constipation.  Genitourinary: Negative for dysuria.  Musculoskeletal:       + neck pain  Skin: Negative for rash.  Neurological: Negative for headaches.  Hematological: Negative for adenopathy.  Psychiatric/Behavioral:       Reports mild depression since death of her mother.    Past Medical History  Diagnosis Date  . Hypertension   . Hyperlipidemia   . Palpitations   . Cancer 2007    History   Social History  . Marital Status: Married    Spouse Name: N/A    Number of Children: 0  . Years of Education: N/A   Occupational History  .     Social History Main Topics  . Smoking status: Never Smoker   . Smokeless tobacco: Not on file  . Alcohol Use: No  . Drug Use: No  . Sexually Active: Not on file   Other Topics Concern  . Not on file   Social History Narrative   No children   Married   Enjoys  quilting and horseback riding.    She works in [employer].   She completed 2 years of college.     Past Surgical History  Procedure Laterality Date  . Breast lumpectomy  2007  . Abdominal hysterectomy  1985    Family History  Problem Relation Age of Onset  . Breast cancer    . Colon cancer    . Lung cancer Father     Died at 10 from lung cancer  . Heart disease Father   . Cancer Mother     colon cancer diagnosed at 25  . Hypertension Mother   . Kidney disease Mother   . Diabetes Mother   . Cancer Mother   . Coronary artery disease Brother 32    died of "massive heart attack"    Allergies  Allergen Reactions  . Sulfa Drugs Cross Reactors     Current Outpatient Prescriptions on File Prior to Visit  Medication Sig Dispense Refill  . citalopram (CELEXA) 40 MG tablet Take 40 mg by mouth daily.       . Multiple Vitamin (MULTIVITAMIN) tablet Take 1 tablet by mouth daily.        . pravastatin (PRAVACHOL) 20 MG tablet Take 20 mg by mouth daily.        Marland Kitchen  triamterene-hydrochlorothiazide (DYAZIDE) 37.5-25 MG per capsule Take 1 capsule by mouth every morning.        . Cholecalciferol (VITAMIN D3) 1000 UNITS CAPS Take 1 capsule by mouth daily.        No current facility-administered medications on file prior to visit.    BP 110/70  Pulse 82  Temp(Src) 97.7 F (36.5 C) (Oral)  Resp 16  Ht 5' 3.5" (1.613 m)  Wt 197 lb 1.3 oz (89.395 kg)  BMI 34.36 kg/m2  SpO2 96%  LMP 03/17/1983       Objective:   Physical Exam  Constitutional: She is oriented to person, place, and time. She appears well-developed and well-nourished. No distress.  Cardiovascular: Normal rate and regular rhythm.   No murmur heard. Pulmonary/Chest: Effort normal and breath sounds normal. No respiratory distress. She has no wheezes. She has no rales. She exhibits no tenderness.  Abdominal: Soft. Bowel sounds are normal. She exhibits no distension and no mass. There is no tenderness. There is no  rebound and no guarding.  Musculoskeletal: She exhibits no edema.  Neurological: She is alert and oriented to person, place, and time.  Psychiatric: She has a normal mood and affect. Her behavior is normal. Judgment and thought content normal.          Assessment & Plan:

## 2012-05-10 NOTE — Addendum Note (Signed)
Addended by: Mervin Kung A on: 05/10/2012 04:46 PM   Modules accepted: Orders

## 2012-05-10 NOTE — Assessment & Plan Note (Signed)
Likely cause for lower abdominal discomfort. Recommended prn miralax. Urine dip is unremakable. She took her PRN macrodantin that she had on hand.  Will sen urine for culture.

## 2012-05-10 NOTE — Patient Instructions (Addendum)
Complete lab work prior to leaving.  Please schedule fasting physical. Welcome to [facility]!

## 2012-05-10 NOTE — Assessment & Plan Note (Signed)
She is now continuing with annual screening mammogram and is up to date.

## 2012-05-10 NOTE — Assessment & Plan Note (Signed)
Tolerating statin, obtain lft.  

## 2012-05-11 ENCOUNTER — Encounter: Payer: Self-pay | Admitting: Family

## 2012-05-11 LAB — URINE CULTURE: Colony Count: 4000

## 2012-05-12 ENCOUNTER — Encounter: Payer: Self-pay | Admitting: Family

## 2012-05-31 ENCOUNTER — Other Ambulatory Visit: Payer: Self-pay | Admitting: Obstetrics & Gynecology

## 2012-05-31 MED ORDER — CITALOPRAM HYDROBROMIDE 40 MG PO TABS: 40.0000 mg | ORAL_TABLET | Freq: Every day | ORAL | Status: DC

## 2012-06-01 ENCOUNTER — Other Ambulatory Visit: Payer: Self-pay | Admitting: Obstetrics & Gynecology

## 2012-06-01 MED ORDER — CITALOPRAM HYDROBROMIDE 40 MG PO TABS: 40.0000 mg | ORAL_TABLET | Freq: Every day | ORAL | Status: DC

## 2012-06-22 ENCOUNTER — Encounter: Payer: Self-pay | Admitting: Family

## 2012-06-22 ENCOUNTER — Ambulatory Visit (HOSPITAL_BASED_OUTPATIENT_CLINIC_OR_DEPARTMENT_OTHER)
Admission: RE | Admit: 2012-06-22 | Discharge: 2012-06-22 | Disposition: A | Discharge status: Home / Self Care | Payer: BC Managed Care – PPO | Source: Ambulatory Visit | Attending: Family | Admitting: Family

## 2012-06-22 ENCOUNTER — Telehealth: Payer: Self-pay | Admitting: Family

## 2012-06-22 ENCOUNTER — Ambulatory Visit (INDEPENDENT_AMBULATORY_CARE_PROVIDER_SITE_OTHER): Payer: BC Managed Care – PPO | Admitting: Family

## 2012-06-22 ENCOUNTER — Other Ambulatory Visit: Payer: Self-pay | Admitting: Family

## 2012-06-22 VITALS — BP 126/78 | HR 79 | Temp 98.1°F | Resp 16 | Ht 63.5 in | Wt 196.1 lb

## 2012-06-22 DIAGNOSIS — Z0000 Encounter for general adult medical examination without abnormal findings: Secondary | ICD-10-CM

## 2012-06-22 DIAGNOSIS — Z853 Personal history of malignant neoplasm of breast: Secondary | ICD-10-CM | POA: Insufficient documentation

## 2012-06-22 DIAGNOSIS — R1011 Right upper quadrant pain: Secondary | ICD-10-CM | POA: Insufficient documentation

## 2012-06-22 DIAGNOSIS — R109 Unspecified abdominal pain: Secondary | ICD-10-CM

## 2012-06-22 DIAGNOSIS — R110 Nausea: Secondary | ICD-10-CM

## 2012-06-22 HISTORY — DX: Encounter for general adult medical examination without abnormal findings: Z00.00

## 2012-06-22 LAB — LIPID PANEL
Cholesterol: 209 mg/dL — ABNORMAL HIGH (ref 0–200)
HDL: 36 mg/dL — ABNORMAL LOW (ref >39)
LDL Cholesterol: 136 mg/dL — ABNORMAL HIGH (ref 0–99)
Triglycerides: 185 mg/dL — ABNORMAL HIGH (ref <150)
VLDL: 37 mg/dL (ref 0–40)

## 2012-06-22 MED ORDER — OMEPRAZOLE MAGNESIUM 20 MG PO TBEC: 20.0000 mg | DELAYED_RELEASE_TABLET | Freq: Every day | ORAL | Status: DC

## 2012-06-22 NOTE — Patient Instructions (Addendum)
Schedule bone density at the front desk. Schedule abdominal ultrasound in imaging department. Complete lab work prior to leaving. Check with insurance re: coverage for zostavax. Follow up in 6 months.

## 2012-06-22 NOTE — Progress Notes (Signed)
Subjective:    Patient ID: Katelyn Porter, female    DOB: 25-Sep-1950, 62 y.o.   MRN: [id number]  HPI  Katelyn Porter is a 62 yr old female who presents today for CPX.  Immunizations: tetanus up to date, interested in zostavax Diet: trying to improve Katelyn Porter diet.  Exercise: Not exercising Colonoscopy: 3 yrs ago- done at [facility]. Dexa: Reports last bone density years ago Pap Smear: s/p partial hysterectomy- follows with Dr. Leda Quail GYN Mammogram:  Up to date  Nausea/RUQ pain- reports multiple family members have had cholecystectomy.  Notes daily nausea, intermittent RUQ pain.  Review of Systems  Constitutional: Negative for unexpected weight change.  HENT: Negative for congestion.   Eyes: Negative for visual disturbance.  Respiratory: Negative for cough and shortness of breath.   Cardiovascular: Negative for chest pain and leg swelling.       Reports "flutters" in Katelyn Porter heart.  Had same symptoms during stress test and note was made of PVC's  Gastrointestinal: Positive for nausea. Negative for vomiting and diarrhea.       Some nausea in the middle of Katelyn Porter meal at least once a day.   Some abdominal pain Right upper quadrant intermittently.  Endocrine: Positive for heat intolerance.  Genitourinary: Negative for dysuria and frequency.  Neurological: Negative for headaches.  Hematological: Negative for adenopathy.  Psychiatric/Behavioral:       Denies depression or anxiety       Past Medical History  Diagnosis Date  . Hypertension   . Hyperlipidemia   . Palpitations   . Cancer 2007    History   Social History  . Marital Status: Married    Spouse Name: N/A    Number of Children: 0  . Years of Education: N/A   Occupational History  .     Social History Main Topics  . Smoking status: Never Smoker   . Smokeless tobacco: Not on file  . Alcohol Use: No  . Drug Use: No  . Sexually Active: Not on file   Other Topics Concern  . Not on file   Social History Narrative   No  children   Married   Enjoys quilting and horseback riding.    Katelyn Porter works in [employer].   Katelyn Porter completed 2 years of college.     Past Surgical History  Procedure Laterality Date  . Breast lumpectomy  2007  . Abdominal hysterectomy  1985    Family History  Problem Relation Age of Onset  . Breast cancer    . Colon cancer    . Lung cancer Father     Died at 13 from lung cancer  . Heart disease Father   . Cancer Mother     colon cancer diagnosed at 58  . Hypertension Mother   . Kidney disease Mother   . Diabetes Mother   . Cancer Mother   . Coronary artery disease Brother 58    died of "massive heart attack"    Allergies  Allergen Reactions  . Crab (Shellfish Allergy) Swelling    Blisters on tongue.  Gaetana Michaelis Drugs Cross Reactors     Current Outpatient Prescriptions on File Prior to Visit  Medication Sig Dispense Refill  . Cholecalciferol (VITAMIN D3) 1000 UNITS CAPS Take 1 capsule by mouth daily.       . citalopram (CELEXA) 40 MG tablet Take 1 tablet (40 mg total) by mouth daily.  90 tablet  3  . Multiple Vitamin (MULTIVITAMIN) tablet Take 1  tablet by mouth daily.        . pravastatin (PRAVACHOL) 20 MG tablet Take 20 mg by mouth daily.        Marland Kitchen triamterene-hydrochlorothiazide (DYAZIDE) 37.5-25 MG per capsule Take 1 capsule by mouth every morning.         No current facility-administered medications on file prior to visit.    BP 126/78  Pulse 79  Temp(Src) 98.1 F (36.7 C) (Oral)  Resp 16  Ht 5' 3.5" (1.613 m)  Wt 196 lb 1.9 oz (88.959 kg)  BMI 34.19 kg/m2  SpO2 97%  LMP 03/17/1983    Objective:   Physical Exam Physical Exam  Constitutional: Katelyn Porter is oriented to person, place, and time. Katelyn Porter appears well-developed and well-nourished. No distress.  HENT:  Head: Normocephalic and atraumatic.  Right Ear: Tympanic membrane and ear canal normal.  Left Ear: Tympanic membrane and ear canal normal.  Mouth/Throat: Oropharynx is clear and moist.  Eyes: Pupils  are equal, round, and reactive to light. No scleral icterus.  Neck: Normal range of motion. No thyromegaly present.  Cardiovascular: Normal rate and regular rhythm.   No murmur heard. Pulmonary/Chest: Effort normal and breath sounds normal. No respiratory distress. He has no wheezes. Katelyn Porter has no rales. Katelyn Porter exhibits no tenderness.  Abdominal: Soft. Bowel sounds are normal. He exhibits no distension and no mass. There is no tenderness. There is no rebound and no guarding.  Musculoskeletal: Katelyn Porter exhibits no edema.  Lymphadenopathy:    Katelyn Porter has no cervical adenopathy.  Neurological: Katelyn Porter is alert and oriented to person, place, and time. Katelyn Porter has normal reflexes. Katelyn Porter exhibits normal muscle tone. Coordination normal.  Skin: Skin is warm and dry.  Psychiatric: Katelyn Porter has a normal mood and affect. Katelyn Porter behavior is normal. Judgment and thought content normal.  Breasts: Examined lying Right: Without masses, retractions, discharge or axillary adenopathy.  Left: Without masses, retractions, discharge or axillary adenopathy. Scar noted left lateral breast from former lumpectomy Inguinal/mons: Normal without inguinal adenopathy  Pelvic: Deferred to GYN         Assessment & Plan:          Assessment & Plan:

## 2012-06-22 NOTE — Telephone Encounter (Signed)
Gallbladder normal per ultrasound.  I recommend that she add prilosec otc 20mg  once daily and call if symptoms worsen or if not improved in 1 week.

## 2012-06-22 NOTE — Assessment & Plan Note (Signed)
Pt counseled on healthy diet, exercise, weight loss. She is unsure if she had chicken pox as child. She is interested in zostavax. Will check varicella titer and if + plan nurse visit for zostavax.  Refer for bone density.

## 2012-06-22 NOTE — Assessment & Plan Note (Addendum)
Some associated RUQ pain.  Non-tender today, will obtain abdominal US to evaluate gallbladder. Add metamucil for constipation.

## 2012-06-22 NOTE — Telephone Encounter (Signed)
Patient informed, understood & agreed; new OTC medication added to med list/SLS

## 2012-06-23 ENCOUNTER — Telehealth: Payer: Self-pay | Admitting: Family

## 2012-06-23 LAB — VARICELLA ZOSTER ANTIBODY, IGG: Varicella IgG: 1085 Index — ABNORMAL HIGH (ref <135.00)

## 2012-06-23 NOTE — Telephone Encounter (Signed)
Received medical records from [facility]  P: [phone] F: [phone]

## 2012-06-25 ENCOUNTER — Telehealth: Payer: Self-pay | Admitting: Family

## 2012-06-25 DIAGNOSIS — E785 Hyperlipidemia, unspecified: Secondary | ICD-10-CM

## 2012-06-25 MED ORDER — PRAVASTATIN SODIUM 40 MG PO TABS: 40.0000 mg | ORAL_TABLET | Freq: Every day | ORAL | Status: DC

## 2012-06-25 NOTE — Telephone Encounter (Signed)
Pls call pt and let her know that her blood work shows that she did have chicken pox.  She can schedule nurse visit for zostavax at her convenience.  Also, cholesterol is a little bit above goal.  Increase pravastatin from 20mg  to 40mg .  Follow up flp/lft in 6 weeks.

## 2012-06-27 NOTE — Telephone Encounter (Signed)
Notified pt and she voices understanding. Future lab order entered. 

## 2012-06-28 ENCOUNTER — Inpatient Hospital Stay: Admission: RE | Admit: 2012-06-28 | Payer: BC Managed Care – PPO | Source: Ambulatory Visit

## 2012-10-27 ENCOUNTER — Telehealth: Payer: Self-pay | Admitting: *Deleted

## 2012-10-27 MED ORDER — PRAVASTATIN SODIUM 40 MG PO TABS: 40.0000 mg | ORAL_TABLET | Freq: Every day | ORAL | Status: DC

## 2012-10-27 NOTE — Telephone Encounter (Signed)
Pt left message on voicemail requesting refill on pravastatin. Refill sent and pt notified.

## 2012-12-20 ENCOUNTER — Ambulatory Visit: Payer: BC Managed Care – PPO | Admitting: Family

## 2013-01-20 ENCOUNTER — Other Ambulatory Visit: Payer: Self-pay | Admitting: Obstetrics & Gynecology

## 2013-01-20 NOTE — Telephone Encounter (Signed)
[facility] scheduled for 04/13/13   According to paper chart patient does have a history of recurrent uti's   Last given at [facility] 01/14/12 #30/5 refills  (Chart In Your Door)  Please advise

## 2013-01-24 ENCOUNTER — Ambulatory Visit (INDEPENDENT_AMBULATORY_CARE_PROVIDER_SITE_OTHER): Payer: BC Managed Care – PPO | Admitting: Family

## 2013-01-24 ENCOUNTER — Encounter: Payer: Self-pay | Admitting: Family

## 2013-01-24 VITALS — BP 136/80 | HR 68 | Temp 98.0°F | Resp 16 | Ht 63.5 in | Wt 201.0 lb

## 2013-01-24 DIAGNOSIS — N951 Menopausal and female climacteric states: Secondary | ICD-10-CM

## 2013-01-24 DIAGNOSIS — F3341 Major depressive disorder, recurrent, in partial remission: Secondary | ICD-10-CM | POA: Insufficient documentation

## 2013-01-24 DIAGNOSIS — F329 Major depressive disorder, single episode, unspecified: Secondary | ICD-10-CM

## 2013-01-24 DIAGNOSIS — F32A Depression, unspecified: Secondary | ICD-10-CM

## 2013-01-24 DIAGNOSIS — I10 Essential (primary) hypertension: Secondary | ICD-10-CM

## 2013-01-24 DIAGNOSIS — R232 Flushing: Secondary | ICD-10-CM

## 2013-01-24 DIAGNOSIS — K219 Gastro-esophageal reflux disease without esophagitis: Secondary | ICD-10-CM

## 2013-01-24 DIAGNOSIS — E785 Hyperlipidemia, unspecified: Secondary | ICD-10-CM

## 2013-01-24 HISTORY — DX: Major depressive disorder, single episode, unspecified: F32.9

## 2013-01-24 HISTORY — DX: Gastro-esophageal reflux disease without esophagitis: K21.9

## 2013-01-24 HISTORY — DX: Depression, unspecified: F32.A

## 2013-01-24 HISTORY — DX: Major depressive disorder, recurrent, in partial remission: F33.41

## 2013-01-24 LAB — BASIC METABOLIC PANEL WITH GFR
BUN: 18 mg/dL (ref 6–23)
CO2: 26 mEq/L (ref 19–32)
Calcium: 9.3 mg/dL (ref 8.4–10.5)
Creat: 0.91 mg/dL (ref 0.50–1.10)
GFR, Est Non African American: 68 mL/min

## 2013-01-24 LAB — HEPATIC FUNCTION PANEL
AST: 17 U/L (ref 0–37)
Albumin: 4.3 g/dL (ref 3.5–5.2)
Total Bilirubin: 0.8 mg/dL (ref 0.3–1.2)
Total Protein: 6.8 g/dL (ref 6.0–8.3)

## 2013-01-24 LAB — LIPID PANEL
Cholesterol: 253 mg/dL — ABNORMAL HIGH (ref 0–200)
Total CHOL/HDL Ratio: 6.3 Ratio

## 2013-01-24 MED ORDER — MELOXICAM 7.5 MG PO TABS: 7.5000 mg | ORAL_TABLET | Freq: Every day | ORAL | Status: DC
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
## 2013-01-24 NOTE — Patient Instructions (Signed)
Please complete your lab work prior to leaving. You will be contacted about your referral to the counselor.  Start meloxicam for your neck. Call if symptoms are not improved in 2 weeks.  Follow up in 3 month- sooner if problems/concerns.

## 2013-01-24 NOTE — Assessment & Plan Note (Signed)
Seems to be worsened by marital/relationship problems.  Not certain that med changes will help at this point. I recommended referral to a therapist for counseling. She is agreeable. Continue citalopram.

## 2013-01-24 NOTE — Assessment & Plan Note (Signed)
BP Readings from Last 3 Encounters:  01/24/13 136/80  06/22/12 126/78  05/10/12 110/70   BP is stable.  Continue current meds. Obtain bmet.

## 2013-01-24 NOTE — Assessment & Plan Note (Signed)
Pt finds that citalopram helps.  Monitor.

## 2013-01-24 NOTE — Assessment & Plan Note (Signed)
Tolerating statin. Obtain FLP/LFT, continue statin.

## 2013-01-24 NOTE — Assessment & Plan Note (Signed)
Stable with dietary changes and prn prilosec.

## 2013-01-26 ENCOUNTER — Telehealth: Payer: Self-pay | Admitting: Family

## 2013-01-26 DIAGNOSIS — E785 Hyperlipidemia, unspecified: Secondary | ICD-10-CM

## 2013-01-26 MED ORDER — ATORVASTATIN CALCIUM 40 MG PO TABS: 40.0000 mg | ORAL_TABLET | Freq: Every day | ORAL | Status: DC

## 2013-01-26 NOTE — Telephone Encounter (Signed)
Cholesterol is higher than last time.  I don't think pravastatin is strong enough for her. Will send lipitor to start in place of pravastatin.  Plan repeat FLP/LFT in 3 months. Dx hyperlipidemia.

## 2013-01-27 NOTE — Telephone Encounter (Signed)
Left detailed message on home/cell# re: below instructions and to call if any questions.  Future lab order placed.

## 2013-02-01 ENCOUNTER — Telehealth: Payer: Self-pay | Admitting: *Deleted

## 2013-02-01 NOTE — Telephone Encounter (Signed)
Received message from pt that she checked with her insurance re: referral to counseling and her insurance has very limited coverage. States it will be too expensive for pt to follow through with and wants to know if there is anything else she can do?

## 2013-02-02 NOTE — Telephone Encounter (Signed)
If she wants to adjust her antidepressant medication we can try that. I would add effexor and have her follow up in 1 month.  Let me know.

## 2013-02-03 MED ORDER — SERTRALINE HCL 50 MG PO TABS: 50.0000 mg | ORAL_TABLET | Freq: Every day | ORAL | Status: DC

## 2013-02-03 NOTE — Telephone Encounter (Signed)
Notified pt. She states she has been on Effexor in the past and it caused her to have vomiting on a daily basis. Has also tried zoloft and lexapro. Remembers that zoloft worked well for her in the past. Also states that she has had leg cramping since taking lipitor that she did not have before she started the medication.  She states she continues to try and take the medication but the cramping has been bad at times.  Please advise.

## 2013-02-03 NOTE — Telephone Encounter (Signed)
Notified pt and she voices understanding. 1 month follow up scheduled for 03/06/13 at 3:15 pm.

## 2013-02-03 NOTE — Telephone Encounter (Signed)
Since she seems to have done better in the past on zoloft, lets stop citalopram and start zoloft.   Stop lipitor.  Follow up in 1 month. We will re-evaluate re: cholesterol meds at that time.

## 2013-03-06 ENCOUNTER — Ambulatory Visit (INDEPENDENT_AMBULATORY_CARE_PROVIDER_SITE_OTHER): Payer: BC Managed Care – PPO | Admitting: Family

## 2013-03-06 ENCOUNTER — Encounter: Payer: Self-pay | Admitting: Family

## 2013-03-06 ENCOUNTER — Other Ambulatory Visit: Payer: Self-pay | Admitting: Family

## 2013-03-06 VITALS — BP 130/70 | HR 66 | Temp 98.6°F | Resp 16 | Ht 63.5 in | Wt 203.1 lb

## 2013-03-06 DIAGNOSIS — R232 Flushing: Secondary | ICD-10-CM

## 2013-03-06 DIAGNOSIS — N951 Menopausal and female climacteric states: Secondary | ICD-10-CM

## 2013-03-06 DIAGNOSIS — E785 Hyperlipidemia, unspecified: Secondary | ICD-10-CM

## 2013-03-06 DIAGNOSIS — E010 Iodine-deficiency related diffuse (endemic) goiter: Secondary | ICD-10-CM

## 2013-03-06 DIAGNOSIS — F329 Major depressive disorder, single episode, unspecified: Secondary | ICD-10-CM

## 2013-03-06 DIAGNOSIS — E049 Nontoxic goiter, unspecified: Secondary | ICD-10-CM

## 2013-03-06 LAB — HEPATIC FUNCTION PANEL
ALT: 22 U/L (ref 0–35)
Albumin: 4.1 g/dL (ref 3.5–5.2)
Indirect Bilirubin: 0.3 mg/dL (ref 0.0–0.9)
Total Protein: 6.5 g/dL (ref 6.0–8.3)

## 2013-03-06 NOTE — Progress Notes (Signed)
Pre visit review using our clinic review tool, if applicable. No additional management support is needed unless otherwise documented below in the visit note. 

## 2013-03-06 NOTE — Assessment & Plan Note (Signed)
Repeat TSH, ? Thyromegally, obtain thyroid US.

## 2013-03-06 NOTE — Assessment & Plan Note (Signed)
Now on pravastatin.  Continue same, obtain follow up lft, obtain FLP next visit when fasting.

## 2013-03-06 NOTE — Patient Instructions (Signed)
Please complete lab work prior to leaving. Schedule thyroid ultrasound on the first floor. Follow up in 3 months- come fasting to this appointment.

## 2013-03-06 NOTE — Assessment & Plan Note (Signed)
Improved on zoloft.

## 2013-03-06 NOTE — Progress Notes (Signed)
Subjective:    Patient ID: Katelyn Porter, female    DOB: [birth year], 62 y.o.   MRN: [id number]  HPI  Katelyn Porter is a 62 yr old female who presents today for follow up.  1) Depression- reports feeling much better.  Stopped citalopram, started zoloft.  Feels like she is better handling her stress.   2) Hyperlipidemia- Stopped lipitor.  She is now tolerating pravastatin without difficulty.    3) Hot flashes- has tried gabapentin without improvement.     Review of Systems See HPI  Past Medical History  Diagnosis Date  . Hypertension   . Hyperlipidemia   . Palpitations   . Cancer 2007  . Depression 01/24/2013    History   Social History  . Marital Status: Married    Spouse Name: N/A    Number of Children: 0  . Years of Education: N/A   Occupational History  .     Social History Main Topics  . Smoking status: Never Smoker   . Smokeless tobacco: Not on file  . Alcohol Use: No  . Drug Use: No  . Sexual Activity: Not on file   Other Topics Concern  . Not on file   Social History Narrative   No children   Married   Enjoys quilting and horseback riding.    She works in [employer].   She completed 2 years of college.     Past Surgical History  Procedure Laterality Date  . Breast lumpectomy  2007  . Abdominal hysterectomy  1985    Family History  Problem Relation Age of Onset  . Breast cancer    . Colon cancer    . Lung cancer Father     Died at 44 from lung cancer  . Heart disease Father   . Cancer Mother     colon cancer diagnosed at 60  . Hypertension Mother   . Kidney disease Mother   . Diabetes Mother   . Cancer Mother   . Coronary artery disease Brother 31    died of "massive heart attack"    Allergies  Allergen Reactions  . Crab [Shellfish Allergy] Swelling    Blisters on tongue.  . Lipitor [Atorvastatin]     Muscle cramping  . Sulfa Drugs Cross Reactors     Current Outpatient Prescriptions on File Prior to Visit  Medication Sig  Dispense Refill  . Calcium Carbonate-Vitamin D (CALTRATE 600+D) 600-400 MG-UNIT per tablet Take 1 tablet by mouth daily.      . Cholecalciferol (VITAMIN D3) 1000 UNITS CAPS Take 1 capsule by mouth daily.       . meloxicam (MOBIC) 7.5 MG tablet Take 1 tablet (7.5 mg total) by mouth daily.  14 tablet  0  . Multiple Vitamin (MULTIVITAMIN) tablet Take 1 tablet by mouth daily.        . nitrofurantoin, macrocrystal-monohydrate, (MACROBID) 100 MG capsule TAKE ONE CAPSULE BY MOUTH AS NEEDED      . omeprazole (PRILOSEC OTC) 20 MG tablet Take 20 mg by mouth daily as needed.      Marland Kitchen OVER THE COUNTER MEDICATION OTC Menopause supplement.  Take 1 tablet twice a day.      . sertraline (ZOLOFT) 50 MG tablet Take 1 tablet (50 mg total) by mouth daily.  30 tablet  0  . triamterene-hydrochlorothiazide (DYAZIDE) 37.5-25 MG per capsule Take 1 capsule by mouth every morning.         No current facility-administered medications on file  prior to visit.    BP 130/70  Pulse 66  Temp(Src) 98.6 F (37 C) (Oral)  Resp 16  Ht 5' 3.5" (1.613 m)  Wt 203 lb 1.3 oz (92.116 kg)  BMI 35.41 kg/m2  SpO2 98%  LMP 03/17/1983       Objective:   Physical Exam  Constitutional: She is oriented to person, place, and time. She appears well-developed and well-nourished. No distress.  HENT:  Head: Normocephalic and atraumatic.  Cardiovascular: Normal rate and regular rhythm.   No murmur heard. Pulmonary/Chest: Effort normal and breath sounds normal. No respiratory distress. She has no wheezes. She has no rales. She exhibits no tenderness.  Neurological: She is alert and oriented to person, place, and time.  Psychiatric: She has a normal mood and affect. Her behavior is normal. Judgment and thought content normal.          Assessment & Plan:

## 2013-03-07 ENCOUNTER — Ambulatory Visit (HOSPITAL_BASED_OUTPATIENT_CLINIC_OR_DEPARTMENT_OTHER)
Admission: RE | Admit: 2013-03-07 | Discharge: 2013-03-07 | Disposition: A | Discharge status: Home / Self Care | Payer: BC Managed Care – PPO | Source: Ambulatory Visit | Attending: Family | Admitting: Family

## 2013-03-07 DIAGNOSIS — E049 Nontoxic goiter, unspecified: Secondary | ICD-10-CM | POA: Insufficient documentation

## 2013-03-07 DIAGNOSIS — E010 Iodine-deficiency related diffuse (endemic) goiter: Secondary | ICD-10-CM

## 2013-03-08 ENCOUNTER — Encounter: Payer: Self-pay | Admitting: Family

## 2013-03-08 ENCOUNTER — Telehealth: Payer: Self-pay | Admitting: Family

## 2013-03-08 NOTE — Telephone Encounter (Signed)
Please call pt and let her know that thyroid ultrasound, liver function and thyroid function are all normal. No need to mail lab letter- thanks.

## 2013-03-08 NOTE — Telephone Encounter (Signed)
Notified pt and she voices understanding. 

## 2013-03-31 ENCOUNTER — Other Ambulatory Visit: Payer: Self-pay | Admitting: Family

## 2013-03-31 DIAGNOSIS — Z853 Personal history of malignant neoplasm of breast: Secondary | ICD-10-CM

## 2013-04-13 ENCOUNTER — Encounter: Payer: Self-pay | Admitting: Obstetrics & Gynecology

## 2013-04-13 ENCOUNTER — Ambulatory Visit (INDEPENDENT_AMBULATORY_CARE_PROVIDER_SITE_OTHER): Payer: BC Managed Care – PPO | Admitting: Obstetrics & Gynecology

## 2013-04-13 VITALS — BP 136/74 | HR 68 | Ht 62.5 in | Wt 202.0 lb

## 2013-04-13 DIAGNOSIS — Z01419 Encounter for gynecological examination (general) (routine) without abnormal findings: Secondary | ICD-10-CM

## 2013-04-13 DIAGNOSIS — Z0000 Encounter for general adult medical examination without abnormal findings: Secondary | ICD-10-CM

## 2013-04-13 DIAGNOSIS — E2839 Other primary ovarian failure: Secondary | ICD-10-CM

## 2013-04-13 LAB — POCT URINALYSIS DIPSTICK
BILIRUBIN UA: NEGATIVE
GLUCOSE UA: NEGATIVE
Ketones, UA: NEGATIVE
Nitrite, UA: NEGATIVE
PH UA: 5
Protein, UA: NEGATIVE
RBC UA: NEGATIVE
Urobilinogen, UA: NEGATIVE

## 2013-04-13 LAB — COMPREHENSIVE METABOLIC PANEL
ALK PHOS: 74 U/L (ref 39–117)
ALT: 16 U/L (ref 0–35)
AST: 15 U/L (ref 0–37)
Albumin: 4.4 g/dL (ref 3.5–5.2)
BILIRUBIN TOTAL: 0.6 mg/dL (ref 0.2–1.2)
BUN: 16 mg/dL (ref 6–23)
CO2: 30 meq/L (ref 19–32)
CREATININE: 0.94 mg/dL (ref 0.50–1.10)
Calcium: 9.6 mg/dL (ref 8.4–10.5)
Chloride: 101 mEq/L (ref 96–112)
Glucose, Bld: 81 mg/dL (ref 70–99)
Potassium: 3.9 mEq/L (ref 3.5–5.3)
SODIUM: 140 meq/L (ref 135–145)
TOTAL PROTEIN: 6.7 g/dL (ref 6.0–8.3)

## 2013-04-13 MED ORDER — CITALOPRAM HYDROBROMIDE 40 MG PO TABS: 40.0000 mg | ORAL_TABLET | Freq: Every day | ORAL | Status: DC

## 2013-04-13 MED ORDER — SERTRALINE HCL 50 MG PO TABS: 50.0000 mg | ORAL_TABLET | Freq: Every day | ORAL | Status: DC

## 2013-04-13 MED ORDER — NITROFURANTOIN MONOHYD MACRO 100 MG PO CAPS: ORAL_CAPSULE | ORAL | Status: DC

## 2013-04-13 NOTE — Progress Notes (Signed)
63 y.o. G1P0 MarriedAfrican AmericanF here for annual exam.  Says she just doesn't feel well.  Continues to have lots of hot flashes.  Husband with prostate cancer and very grouchy.  She was on Celexa and Dr. Inda Castle added Zoloft.  Pt felt better on both.  Now doesn't have rx for zoloft.    Pt using the macrobid for recurrent UTIs.  She has seen urology and had a negative evaluation with Dr. Terance Hart.  Cystoscopy was negative.  Hasn't had a recent UTI.  Not SA due to husband's issues.  Patient's last menstrual period was 03/17/1983.          Sexually active: no  The current method of family planning is status post hysterectomy.    Exercising: no  not in the last 3 months, was doing treadmill twice daily Smoker:  no  Health Maintenance: Pap:  2007 History of abnormal Pap:  no MMG:  05/02/12 diag in one year-scheduled in 3/15 Colonoscopy:  2012 repeat in 5 years BMD:   2007 TDaP:  2013 Screening Labs: PCP, Hb today: PCP, Urine today: WBC-1+   reports that she has never smoked. She has never used smokeless tobacco. She reports that she does not drink alcohol or use illicit drugs.  Past Medical History  Diagnosis Date  . Hypertension   . Hyperlipidemia   . Palpitations   . Cancer 2007  . Depression 01/24/2013  . Breast cancer 2008    with radiation finished 08, tamoxifen  . Vertigo     Past Surgical History  Procedure Laterality Date  . Breast lumpectomy  2007  . Abdominal hysterectomy  1985    Current Outpatient Prescriptions  Medication Sig Dispense Refill  . Calcium Carbonate-Vitamin D (CALTRATE 600+D) 600-400 MG-UNIT per tablet Take 1 tablet by mouth daily.      . Cholecalciferol (VITAMIN D3) 1000 UNITS CAPS Take 1 capsule by mouth daily.       . citalopram (CELEXA) 40 MG tablet 40 mg daily.      . meloxicam (MOBIC) 7.5 MG tablet Take 1 tablet (7.5 mg total) by mouth daily.  14 tablet  0  . Multiple Vitamin (MULTIVITAMIN) tablet Take 1 tablet by mouth daily.        .  nitrofurantoin, macrocrystal-monohydrate, (MACROBID) 100 MG capsule TAKE ONE CAPSULE BY MOUTH AS NEEDED      . pravastatin (PRAVACHOL) 40 MG tablet 40 mg daily.      . sertraline (ZOLOFT) 50 MG tablet Take 1 tablet (50 mg total) by mouth daily.  30 tablet  0  . triamterene-hydrochlorothiazide (DYAZIDE) 37.5-25 MG per capsule Take 1 capsule by mouth every morning.        Marland Kitchen omeprazole (PRILOSEC OTC) 20 MG tablet Take 20 mg by mouth daily as needed.       No current facility-administered medications for this visit.    Family History  Problem Relation Age of Onset  . Breast cancer    . Colon cancer    . Lung cancer Father     Died at 50 from lung cancer  . Heart disease Father   . Hypertension Mother   . Kidney disease Mother   . Diabetes Mother   . Cancer Mother     colon cancer diagnosed at 49  . Coronary artery disease Brother 62    died of "massive heart attack"  . Diabetes Brother   . Hypertension Sister     ROS:  Pertinent items are noted in HPI.  Otherwise, a comprehensive ROS was negative.  Exam:   BP 136/74  Pulse 68  Ht 5' 2.5" (1.588 m)  Wt 202 lb (91.627 kg)  BMI 36.33 kg/m2  LMP 03/17/1983  Weight change: +8 Height: 5' 2.5" (158.8 cm)  Ht Readings from Last 3 Encounters:  04/13/13 5' 2.5" (1.588 m)  03/06/13 5' 3.5" (1.613 m)  01/24/13 5' 3.5" (1.613 m)    General appearance: alert, cooperative and appears stated age Head: Normocephalic, without obvious abnormality, atraumatic Neck: no adenopathy, supple, symmetrical, trachea midline and thyroid normal to inspection and palpation Lungs: clear to auscultation bilaterally Breasts: normal appearance, no masses or tenderness, well healed scar on left breast Heart: regular rate and rhythm Abdomen: soft, non-tender; bowel sounds normal; no masses,  no organomegaly Extremities: extremities normal, atraumatic, no cyanosis or edema Skin: Skin color, texture, turgor normal. No rashes or lesions Lymph nodes: Cervical,  supraclavicular, and axillary nodes normal. No abnormal inguinal nodes palpated Neurologic: Grossly normal   Pelvic: External genitalia:  no lesions              Urethra:  normal appearing urethra with no masses, tenderness or lesions              Bartholins and Skenes: normal                 Vagina: normal appearing vagina with normal color and discharge, no lesions              Cervix: absent              Pap taken: no Bimanual Exam:  Uterus:  uterus absent              Adnexa: no mass, fullness, tenderness               Rectovaginal: Confirms               Anus:  normal sphincter tone, no lesions  A:  Well Woman with normal exam Breast cancer 2008, released from Dr. [name] Family hx of colon cancer (mother age 12) Hypertension Elevated lipids Recurrent UTIs--negative urology evaluation with Dr. Terance Hart 2011. H/O TAH 1985, ovaries remain  P:   Mammogram yearly.  Does diagnostic. pap smear not indicated CMP and Vit D and TSH Needs BMD with next MMG.  Order placed.  Continue Zoloft 50mg  with Celexa 40mg .  Risks of Serotonin syndrome discussed.  Pt knows to call with any new issues/ problems and to stop Zoloft immediately with abnormal side effects.  Voices understanding. return annually or prn  An After Visit Summary was printed and given to the patient.

## 2013-04-14 LAB — TSH: TSH: 1.02 u[IU]/mL (ref 0.350–4.500)

## 2013-04-14 LAB — VITAMIN D 25 HYDROXY (VIT D DEFICIENCY, FRACTURES): Vit D, 25-Hydroxy: 36 ng/mL (ref 30–89)

## 2013-04-14 NOTE — Patient Instructions (Signed)

## 2013-04-18 ENCOUNTER — Telehealth: Payer: Self-pay

## 2013-04-18 NOTE — Telephone Encounter (Signed)
Message copied by Robley Fries on Tue Apr 18, 2013  1:34 PM ------      Message from: Megan Salon      Created: Tue Apr 18, 2013  6:49 AM       Inform all labs are normal. ------

## 2013-04-18 NOTE — Telephone Encounter (Signed)
Patient returning Kelly's call. °

## 2013-04-18 NOTE — Telephone Encounter (Signed)
[email or web address]

## 2013-04-19 NOTE — Telephone Encounter (Signed)
Patient is returning a call to [facility]

## 2013-04-21 NOTE — Telephone Encounter (Signed)
Patient is calling kelly back °

## 2013-04-21 NOTE — Telephone Encounter (Signed)
Patient notified of all results. 

## 2013-04-24 ENCOUNTER — Ambulatory Visit: Payer: BC Managed Care – PPO | Admitting: Family

## 2013-05-08 ENCOUNTER — Encounter: Payer: Self-pay | Admitting: Family

## 2013-05-08 ENCOUNTER — Ambulatory Visit (INDEPENDENT_AMBULATORY_CARE_PROVIDER_SITE_OTHER): Payer: BC Managed Care – PPO | Admitting: Family

## 2013-05-08 VITALS — BP 126/82 | HR 70 | Temp 98.3°F | Resp 16 | Ht 63.5 in | Wt 204.1 lb

## 2013-05-08 DIAGNOSIS — I10 Essential (primary) hypertension: Secondary | ICD-10-CM

## 2013-05-08 DIAGNOSIS — K219 Gastro-esophageal reflux disease without esophagitis: Secondary | ICD-10-CM

## 2013-05-08 DIAGNOSIS — F329 Major depressive disorder, single episode, unspecified: Secondary | ICD-10-CM

## 2013-05-08 DIAGNOSIS — F32A Depression, unspecified: Secondary | ICD-10-CM

## 2013-05-08 DIAGNOSIS — E785 Hyperlipidemia, unspecified: Secondary | ICD-10-CM

## 2013-05-08 DIAGNOSIS — F3289 Other specified depressive episodes: Secondary | ICD-10-CM

## 2013-05-08 LAB — LIPID PANEL
CHOLESTEROL: 191 mg/dL (ref 0–200)
HDL: 40 mg/dL (ref >39)
LDL Cholesterol: 112 mg/dL — ABNORMAL HIGH (ref 0–99)
Total CHOL/HDL Ratio: 4.8 Ratio
Triglycerides: 193 mg/dL — ABNORMAL HIGH (ref <150)
VLDL: 39 mg/dL (ref 0–40)

## 2013-05-08 MED ORDER — PRAVASTATIN SODIUM 40 MG PO TABS: 40.0000 mg | ORAL_TABLET | Freq: Every day | ORAL | Status: DC

## 2013-05-08 MED ORDER — TRIAMTERENE-HCTZ 37.5-25 MG PO CAPS: 1.0000 | ORAL_CAPSULE | ORAL | Status: DC

## 2013-05-08 MED ORDER — SERTRALINE HCL 50 MG PO TABS: 50.0000 mg | ORAL_TABLET | Freq: Every day | ORAL | Status: DC

## 2013-05-08 NOTE — Assessment & Plan Note (Signed)
Stable.  Continue Prilosec.

## 2013-05-08 NOTE — Assessment & Plan Note (Signed)
Stable. Reports improvement. Continue Zoloft.

## 2013-05-08 NOTE — Patient Instructions (Signed)
Please complete lab work prior to leaving. Follow up in 3 months.  

## 2013-05-08 NOTE — Assessment & Plan Note (Addendum)
Stable. Continue same medications. CMET was normal in January 2015. Counseled patient on diet and exercise, with increasing exercise to 30 min 5 days per week.

## 2013-05-08 NOTE — Assessment & Plan Note (Signed)
Stable on statin, denies myalgias. LFT's normal in late January 2015. Will obtain fasting lipid panel today.

## 2013-05-08 NOTE — Progress Notes (Signed)
Subjective:    Patient ID: Katelyn Porter, female    DOB: Jul 15, 1950, 63 y.o.   MRN: [id number]  Hypertension Associated symptoms include palpitations and shortness of breath. Pertinent negatives include no chest pain or headaches.  Hyperlipidemia Associated symptoms include shortness of breath. Pertinent negatives include no chest pain or myalgias.   Katelyn Porter is a 63 year old female who presents today for follow up.  Hypertension) Denies chest pain and shortness of breath, Reports occasional, intermittent palpitations once daily when at rest. Patient reports she last experienced these palpitations yesterday and states that she feels short of breath during the episode which lasts a few seconds. Patient was followed by Cardiology one year ago and had stress echo which was normal.  BP Readings from Last 3 Encounters:  05/08/13 126/82  04/13/13 136/74  03/06/13 130/70    Hyperlipidemia) Patient reports poor diet consisting of carbohydrates and occasional fast food. Patient reports exercising on treadmill for 15 minutes three times weekly. Patient was started on Zocor last visit for high cholesterol.  Thyromegaly) Denies difficulty swallowing, cold intolerance, and fatigue. Thyroid ultrasound showed normal sized thyroid.   Depression) Patient continues zoloft.  Patient reports mood has improved and is feeling better.   Review of Systems  Constitutional: Negative for activity change, appetite change and fatigue.  Respiratory: Positive for shortness of breath.        Intermittent SOB when experiencing palpitations.  Cardiovascular: Positive for palpitations. Negative for chest pain and leg swelling.  Endocrine: Negative for cold intolerance.  Musculoskeletal: Negative for myalgias.  Neurological: Negative for dizziness, numbness and headaches.  Psychiatric/Behavioral: Positive for sleep disturbance. Negative for suicidal ideas.       Patient reports difficulty staying asleep, denies  waking gasping for air.  Denies depression, reports Zoloft is working.   Past Medical History  Diagnosis Date  . Hypertension   . Hyperlipidemia   . Palpitations   . Cancer 2007  . Depression 01/24/2013  . Breast cancer 2008    with radiation finished 08, tamoxifen  . Vertigo     History   Social History  . Marital Status: Married    Spouse Name: N/A    Number of Children: 0  . Years of Education: N/A   Occupational History  .     Social History Main Topics  . Smoking status: Never Smoker   . Smokeless tobacco: Never Used  . Alcohol Use: No  . Drug Use: No  . Sexual Activity: No     Comment: TAH   Other Topics Concern  . Not on file   Social History Narrative   No children   Married   Enjoys quilting and horseback riding.    She works in Pension scheme manager.   She completed 2 years of college.     Past Surgical History  Procedure Laterality Date  . Breast lumpectomy  2007  . Abdominal hysterectomy  1985    Family History  Problem Relation Age of Onset  . Breast cancer    . Colon cancer    . Lung cancer Father     Died at 34 from lung cancer  . Heart disease Father   . Hypertension Mother   . Kidney disease Mother   . Diabetes Mother   . Cancer Mother     colon cancer diagnosed at 74  . Coronary artery disease Brother 36    died of "massive heart attack"  . Diabetes Brother   . Hypertension  Sister     Allergies  Allergen Reactions  . Crab [Shellfish Allergy] Swelling    Blisters on tongue.  . Lipitor [Atorvastatin]     Muscle cramping  . Sulfa Drugs Cross Reactors     Current Outpatient Prescriptions on File Prior to Visit  Medication Sig Dispense Refill  . Calcium Carbonate-Vitamin D (CALTRATE 600+D) 600-400 MG-UNIT per tablet Take 1 tablet by mouth daily.      . Cholecalciferol (VITAMIN D3) 1000 UNITS CAPS Take 1 capsule by mouth daily.       . Multiple Vitamin (MULTIVITAMIN) tablet Take 1 tablet by mouth daily.        .  nitrofurantoin, macrocrystal-monohydrate, (MACROBID) 100 MG capsule Take 1 capsule PO as directed  30 capsule  3  . omeprazole (PRILOSEC OTC) 20 MG tablet Take 20 mg by mouth daily as needed.       No current facility-administered medications on file prior to visit.    BP 126/82  Pulse 70  Temp(Src) 98.3 F (36.8 C) (Oral)  Resp 16  Ht 5' 3.5" (1.613 m)  Wt 204 lb 1.3 oz (92.57 kg)  BMI 35.58 kg/m2  SpO2 97%  LMP 03/17/1983       Objective:   Physical Exam  Constitutional: She is oriented to person, place, and time. She appears well-developed and well-nourished.  HENT:  Head: Normocephalic.  Neck: Neck supple. No thyromegaly present.  Cardiovascular: Normal rate, regular rhythm, S1 normal, S2 normal and normal heart sounds.   No murmur heard. Pulses:      Radial pulses are 2+ on the right side, and 2+ on the left side.  Pulmonary/Chest: Effort normal and breath sounds normal. No respiratory distress.  Musculoskeletal: She exhibits no edema.  Neurological: She is alert and oriented to person, place, and time.  Skin: Skin is warm and dry.  Psychiatric: She has a normal mood and affect.          Assessment & Plan:  I have personally seen and examined patient and agree with Alma Friendly NP student's assessment and plan.

## 2013-05-09 ENCOUNTER — Encounter: Payer: Self-pay | Admitting: Family

## 2013-05-15 ENCOUNTER — Ambulatory Visit
Admission: RE | Admit: 2013-05-15 | Discharge: 2013-05-15 | Disposition: A | Discharge status: Home / Self Care | Payer: BC Managed Care – PPO | Source: Ambulatory Visit | Attending: Family | Admitting: Family

## 2013-05-15 DIAGNOSIS — Z853 Personal history of malignant neoplasm of breast: Secondary | ICD-10-CM

## 2013-07-17 ENCOUNTER — Ambulatory Visit (HOSPITAL_BASED_OUTPATIENT_CLINIC_OR_DEPARTMENT_OTHER)
Admission: RE | Admit: 2013-07-17 | Discharge: 2013-07-17 | Disposition: A | Discharge status: Home / Self Care | Payer: BC Managed Care – PPO | Source: Ambulatory Visit | Attending: Family | Admitting: Family

## 2013-07-17 ENCOUNTER — Ambulatory Visit (INDEPENDENT_AMBULATORY_CARE_PROVIDER_SITE_OTHER): Payer: BC Managed Care – PPO | Admitting: Family

## 2013-07-17 ENCOUNTER — Encounter: Payer: Self-pay | Admitting: Family

## 2013-07-17 VITALS — BP 110/80 | HR 63 | Temp 97.8°F | Resp 16 | Ht 63.5 in | Wt 204.1 lb

## 2013-07-17 DIAGNOSIS — R1011 Right upper quadrant pain: Secondary | ICD-10-CM | POA: Insufficient documentation

## 2013-07-17 DIAGNOSIS — R109 Unspecified abdominal pain: Secondary | ICD-10-CM | POA: Insufficient documentation

## 2013-07-17 MED ORDER — MELOXICAM 7.5 MG PO TABS: 7.5000 mg | ORAL_TABLET | Freq: Every day | ORAL | Status: DC

## 2013-07-17 NOTE — Progress Notes (Signed)
Subjective:    Patient ID: Katelyn Porter, female    DOB: March 08, 1951, 63 y.o.   MRN: [id number]  HPI  Katelyn Porter is a 63 yr old female who presents today with chief complaint of right sided rib pain.  Reports intermittent nausea.  Pain radiates into the right back and right shoulder pain.  Pain is associated with nausea.  Pain is worsened by some foods.  Denies associated vomiting.  Reports that she had "greasy loose stools for the last week."   Multiple family members have had cholecystectomy.   She also reports some right sided neck/back pain worse with moving her head. Review of Systems See HPI  Past Medical History  Diagnosis Date  . Hypertension   . Hyperlipidemia   . Palpitations   . Cancer 2007  . Depression 01/24/2013  . Breast cancer 2008    with radiation finished 08, tamoxifen  . Vertigo     History   Social History  . Marital Status: Married    Spouse Name: N/A    Number of Children: 0  . Years of Education: N/A   Occupational History  .     Social History Main Topics  . Smoking status: Never Smoker   . Smokeless tobacco: Never Used  . Alcohol Use: No  . Drug Use: No  . Sexual Activity: No     Comment: TAH   Other Topics Concern  . Not on file   Social History Narrative   No children   Married   Enjoys quilting and horseback riding.    She works in Pension scheme manager.   She completed 2 years of college.     Past Surgical History  Procedure Laterality Date  . Breast lumpectomy  2007  . Abdominal hysterectomy  1985    Family History  Problem Relation Age of Onset  . Breast cancer    . Colon cancer    . Lung cancer Father     Died at 31 from lung cancer  . Heart disease Father   . Hypertension Mother   . Kidney disease Mother   . Diabetes Mother   . Cancer Mother     colon cancer diagnosed at 70  . Coronary artery disease Brother 40    died of "massive heart attack"  . Diabetes Brother   . Hypertension Sister     Allergies  Allergen  Reactions  . Crab [Shellfish Allergy] Swelling    Blisters on tongue.  . Lipitor [Atorvastatin]     Muscle cramping  . Sulfa Drugs Cross Reactors     Current Outpatient Prescriptions on File Prior to Visit  Medication Sig Dispense Refill  . Calcium Carbonate-Vitamin D (CALTRATE 600+D) 600-400 MG-UNIT per tablet Take 1 tablet by mouth daily.      . Cholecalciferol (VITAMIN D3) 1000 UNITS CAPS Take 1 capsule by mouth daily.       . Multiple Vitamin (MULTIVITAMIN) tablet Take 1 tablet by mouth daily.        . nitrofurantoin, macrocrystal-monohydrate, (MACROBID) 100 MG capsule Take 1 capsule PO as directed  30 capsule  3  . omeprazole (PRILOSEC OTC) 20 MG tablet Take 20 mg by mouth daily as needed.      . pravastatin (PRAVACHOL) 40 MG tablet Take 1 tablet (40 mg total) by mouth daily.  30 tablet  5  . sertraline (ZOLOFT) 50 MG tablet Take 1 tablet (50 mg total) by mouth daily.  30 tablet  5  .  triamterene-hydrochlorothiazide (DYAZIDE) 37.5-25 MG per capsule Take 1 each (1 capsule total) by mouth every morning.  30 capsule  5   No current facility-administered medications on file prior to visit.    BP 110/80  Pulse 63  Temp(Src) 97.8 F (36.6 C) (Oral)  Resp 16  Ht 5' 3.5" (1.613 m)  Wt 204 lb 1.3 oz (92.57 kg)  BMI 35.58 kg/m2  SpO2 97%  LMP 03/17/1983       Objective:   Physical Exam  Constitutional: She is oriented to person, place, and time. She appears well-developed and well-nourished. No distress.  HENT:  Head: Normocephalic and atraumatic.  Cardiovascular: Normal rate and regular rhythm.   No murmur heard. Pulmonary/Chest: Effort normal and breath sounds normal. No respiratory distress. She has no wheezes. She has no rales. She exhibits no tenderness.  Abdominal: Soft. Bowel sounds are normal. She exhibits no distension and no mass. There is no tenderness. There is no rebound and no guarding.  Musculoskeletal: She exhibits no edema.       Cervical back: She exhibits  no tenderness.       Thoracic back: She exhibits no tenderness.       Lumbar back: She exhibits no tenderness.  Neurological: She is alert and oriented to person, place, and time.  Psychiatric: She has a normal mood and affect. Her behavior is normal. Judgment and thought content normal.          Assessment & Plan:

## 2013-07-17 NOTE — Patient Instructions (Signed)
Please complete lab work prior to leaving. Schedule your ultrasound on the first floor.  Call if symptoms worsen or if symptoms do not improve.

## 2013-07-17 NOTE — Progress Notes (Signed)
Pre visit review using our clinic review tool, if applicable. No additional management support is needed unless otherwise documented below in the visit note. 

## 2013-07-17 NOTE — Assessment & Plan Note (Signed)
Will obtain LFT and abdominal US.  Need to rule out cholecystitis.  May also be having some associated musculoskeletal pain and I have sent rx for meloxicam to her pharmacy.

## 2013-07-18 ENCOUNTER — Telehealth: Payer: Self-pay | Admitting: Family

## 2013-07-18 LAB — HEPATIC FUNCTION PANEL
ALK PHOS: 72 U/L (ref 39–117)
ALT: 15 U/L (ref 0–35)
AST: 18 U/L (ref 0–37)
Albumin: 4.1 g/dL (ref 3.5–5.2)
BILIRUBIN TOTAL: 0.8 mg/dL (ref 0.2–1.2)
Bilirubin, Direct: 0.1 mg/dL (ref 0.0–0.3)
Indirect Bilirubin: 0.7 mg/dL (ref 0.2–1.2)
Total Protein: 6.4 g/dL (ref 6.0–8.3)

## 2013-07-18 NOTE — Telephone Encounter (Signed)
Notified pt and she voices understanding. 

## 2013-07-18 NOTE — Telephone Encounter (Signed)
Please notify pt that abdominal US shows normal gallbladder.  Liver function testing is normal.  Pain could be musculoskeletal.  I would like her to start meloxicam ordered yesterday. Also- increase prilosec to 2 tabs (40mg  once daily). Call if symptoms worsen or if not improved in 1-2 weeks.

## 2013-07-31 ENCOUNTER — Encounter: Payer: Self-pay | Admitting: Family

## 2013-07-31 ENCOUNTER — Ambulatory Visit (INDEPENDENT_AMBULATORY_CARE_PROVIDER_SITE_OTHER): Payer: BC Managed Care – PPO | Admitting: Family

## 2013-07-31 ENCOUNTER — Ambulatory Visit (HOSPITAL_BASED_OUTPATIENT_CLINIC_OR_DEPARTMENT_OTHER)
Admission: RE | Admit: 2013-07-31 | Discharge: 2013-07-31 | Disposition: A | Discharge status: Home / Self Care | Payer: BC Managed Care – PPO | Source: Ambulatory Visit | Attending: Family | Admitting: Family

## 2013-07-31 ENCOUNTER — Telehealth: Payer: Self-pay | Admitting: Family

## 2013-07-31 VITALS — BP 118/74 | HR 73 | Resp 16 | Ht 63.0 in | Wt 204.0 lb

## 2013-07-31 DIAGNOSIS — F329 Major depressive disorder, single episode, unspecified: Secondary | ICD-10-CM

## 2013-07-31 DIAGNOSIS — F3289 Other specified depressive episodes: Secondary | ICD-10-CM

## 2013-07-31 DIAGNOSIS — K219 Gastro-esophageal reflux disease without esophagitis: Secondary | ICD-10-CM

## 2013-07-31 DIAGNOSIS — F32A Depression, unspecified: Secondary | ICD-10-CM

## 2013-07-31 DIAGNOSIS — M542 Cervicalgia: Secondary | ICD-10-CM

## 2013-07-31 DIAGNOSIS — I10 Essential (primary) hypertension: Secondary | ICD-10-CM

## 2013-07-31 DIAGNOSIS — M47812 Spondylosis without myelopathy or radiculopathy, cervical region: Secondary | ICD-10-CM | POA: Insufficient documentation

## 2013-07-31 DIAGNOSIS — E785 Hyperlipidemia, unspecified: Secondary | ICD-10-CM

## 2013-07-31 MED ORDER — METHYLPREDNISOLONE 4 MG PO KIT: PACK | ORAL | Status: DC

## 2013-07-31 NOTE — Telephone Encounter (Signed)
Please notify pt that X ray shows degenerative changes. I would like her to start medrol dose pack.  Call if symptoms worsen or if symptoms do not improve.

## 2013-07-31 NOTE — Progress Notes (Signed)
Subjective:    Patient ID: Katelyn Porter, female    DOB: Sep 08, 1950, 63 y.o.   MRN: [id number]  HPI  Katelyn Porter is a 63 yr old female who presents today for follow up of multiple medical problems.  1) Depression- Pt is maintained on zoloft. Weight has been stable. Reports that this is helping.    2) HTN- she is maintained on triamterene hctz.   BP Readings from Last 3 Encounters:  07/31/13 118/74  07/17/13 110/80  05/08/13 126/82   3) Hyperlipidemia- on pravastatin.  Last LDL 112, trigs were mildly elevated.   4) GERD- Reports prilosec helped her GI discomfort.    4) Shoulder pain- She reports that she has pain with turning her neck to the right. Some sholder pain.  Did some dumbell exercises which helped.  Last night she put a towel over the sholder and it seemed to help.  She denies arm numbness/weakness.   Review of Systems    see HPI  Past Medical History  Diagnosis Date  . Hypertension   . Hyperlipidemia   . Palpitations   . Cancer 2007  . Depression 01/24/2013  . Breast cancer 2008    with radiation finished 08, tamoxifen  . Vertigo     History   Social History  . Marital Status: Married    Spouse Name: N/A    Number of Children: 0  . Years of Education: N/A   Occupational History  .     Social History Main Topics  . Smoking status: Never Smoker   . Smokeless tobacco: Never Used  . Alcohol Use: No  . Drug Use: No  . Sexual Activity: No     Comment: TAH   Other Topics Concern  . Not on file   Social History Narrative   No children   Married   Enjoys quilting and horseback riding.    She works in Pension scheme manager.   She completed 2 years of college.     Past Surgical History  Procedure Laterality Date  . Breast lumpectomy  2007  . Abdominal hysterectomy  1985    Family History  Problem Relation Age of Onset  . Breast cancer    . Colon cancer    . Lung cancer Father     Died at 79 from lung cancer  . Heart disease Father   .  Hypertension Mother   . Kidney disease Mother   . Diabetes Mother   . Cancer Mother     colon cancer diagnosed at 16  . Coronary artery disease Brother 64    died of "massive heart attack"  . Diabetes Brother   . Hypertension Sister     Allergies  Allergen Reactions  . Crab [Shellfish Allergy] Swelling    Blisters on tongue.  . Lipitor [Atorvastatin]     Muscle cramping  . Sulfa Drugs Cross Reactors     Current Outpatient Prescriptions on File Prior to Visit  Medication Sig Dispense Refill  . Calcium Carbonate-Vitamin D (CALTRATE 600+D) 600-400 MG-UNIT per tablet Take 1 tablet by mouth daily.      . Cholecalciferol (VITAMIN D3) 1000 UNITS CAPS Take 1 capsule by mouth daily.       . Multiple Vitamin (MULTIVITAMIN) tablet Take 1 tablet by mouth daily.        Marland Kitchen omeprazole (PRILOSEC OTC) 20 MG tablet Take 40 mg by mouth daily.       . pravastatin (PRAVACHOL) 40 MG tablet Take 1  tablet (40 mg total) by mouth daily.  30 tablet  5  . triamterene-hydrochlorothiazide (DYAZIDE) 37.5-25 MG per capsule Take 1 each (1 capsule total) by mouth every morning.  30 capsule  5  . nitrofurantoin, macrocrystal-monohydrate, (MACROBID) 100 MG capsule Take 1 capsule PO as directed  30 capsule  3   No current facility-administered medications on file prior to visit.    BP 118/74  Pulse 73  Resp 16  Ht 5\' 3"  (1.6 m)  Wt 204 lb (92.534 kg)  BMI 36.15 kg/m2  SpO2 99%  LMP 03/17/1983    Objective:   Physical Exam  Constitutional: She is oriented to person, place, and time. She appears well-developed and well-nourished. No distress.  HENT:  Head: Normocephalic and atraumatic.  Cardiovascular: Normal rate and regular rhythm.   No murmur heard. Pulmonary/Chest: Effort normal and breath sounds normal. No respiratory distress. She has no wheezes. She has no rales. She exhibits no tenderness.  Musculoskeletal: She exhibits no edema.       Right shoulder: She exhibits no tenderness.       Left  shoulder: She exhibits no tenderness.       Cervical back: She exhibits no tenderness.  Neurological: She is alert and oriented to person, place, and time.  Psychiatric: She has a normal mood and affect. Her behavior is normal. Judgment and thought content normal.          Assessment & Plan:

## 2013-07-31 NOTE — Progress Notes (Signed)
Pre visit review using our clinic review tool, if applicable. No additional management support is needed unless otherwise documented below in the visit note. 

## 2013-07-31 NOTE — Patient Instructions (Signed)
Stop zoloft.    Complete x ray on the first floor.  Follow up in in 3 months.

## 2013-08-01 NOTE — Telephone Encounter (Signed)
Notified pt and she voices understanding. 

## 2013-08-03 DIAGNOSIS — M542 Cervicalgia: Secondary | ICD-10-CM | POA: Insufficient documentation

## 2013-08-03 NOTE — Assessment & Plan Note (Signed)
Stable on current meds, continue same.  

## 2013-08-03 NOTE — Assessment & Plan Note (Signed)
Improved on prilosec.

## 2013-08-03 NOTE — Assessment & Plan Note (Signed)
Stable on zoloft, continue same.  

## 2013-08-03 NOTE — Assessment & Plan Note (Signed)
Almost at goal on pravastatin, plan FLP next visit.

## 2013-08-03 NOTE — Assessment & Plan Note (Signed)
Pain seems to originate in the neck rather than the shoulder. Will obtain plain film of the Cspine.

## 2013-08-17 ENCOUNTER — Ambulatory Visit (INDEPENDENT_AMBULATORY_CARE_PROVIDER_SITE_OTHER): Payer: BC Managed Care – PPO | Admitting: Internal Medicine

## 2013-08-17 ENCOUNTER — Encounter: Payer: Self-pay | Admitting: Internal Medicine

## 2013-08-17 VITALS — BP 109/69 | HR 95 | Temp 98.4°F | Resp 16 | Ht 64.0 in | Wt 201.0 lb

## 2013-08-17 DIAGNOSIS — K219 Gastro-esophageal reflux disease without esophagitis: Secondary | ICD-10-CM

## 2013-08-17 DIAGNOSIS — E785 Hyperlipidemia, unspecified: Secondary | ICD-10-CM

## 2013-08-17 DIAGNOSIS — N951 Menopausal and female climacteric states: Secondary | ICD-10-CM

## 2013-08-17 DIAGNOSIS — C50919 Malignant neoplasm of unspecified site of unspecified female breast: Secondary | ICD-10-CM

## 2013-08-17 DIAGNOSIS — I10 Essential (primary) hypertension: Secondary | ICD-10-CM

## 2013-08-17 LAB — CBC WITH DIFFERENTIAL/PLATELET
BASOS PCT: 0 % (ref 0–1)
Basophils Absolute: 0 10*3/uL (ref 0.0–0.1)
EOS PCT: 3 % (ref 0–5)
Eosinophils Absolute: 0.2 10*3/uL (ref 0.0–0.7)
HCT: 43 % (ref 36.0–46.0)
Hemoglobin: 14.7 g/dL (ref 12.0–15.0)
Lymphocytes Relative: 49 % — ABNORMAL HIGH (ref 12–46)
Lymphs Abs: 3.3 10*3/uL (ref 0.7–4.0)
MCH: 30.4 pg (ref 26.0–34.0)
MCHC: 34.2 g/dL (ref 30.0–36.0)
MCV: 89 fL (ref 78.0–100.0)
Monocytes Absolute: 0.7 10*3/uL (ref 0.1–1.0)
Monocytes Relative: 10 % (ref 3–12)
Neutro Abs: 2.5 10*3/uL (ref 1.7–7.7)
Neutrophils Relative %: 38 % — ABNORMAL LOW (ref 43–77)
Platelets: 296 10*3/uL (ref 150–400)
RBC: 4.83 MIL/uL (ref 3.87–5.11)
RDW: 13.2 % (ref 11.5–15.5)
WBC: 6.7 10*3/uL (ref 4.0–10.5)

## 2013-08-17 MED ORDER — ESCITALOPRAM OXALATE 10 MG PO TABS: 10.0000 mg | ORAL_TABLET | Freq: Every day | ORAL | Status: DC

## 2013-08-17 NOTE — Patient Instructions (Signed)
See me in 8 weeks  

## 2013-08-17 NOTE — Progress Notes (Signed)
Subjective:    Patient ID: Katelyn Porter, female    DOB: March 01, 1951, 63 y.o.   MRN: [id number]  HPI  Katelyn Porter is here for first visit  Referred for consult regarding bothersome hot flushes.  She works in Biomedical scientist.    PMH  High Grade DCIS  Of left breast  Now completed 5 years Tamoxifen treatment , HTN (on Dyazide), GERD (Prilosec),  Hyperlipidemia,  Cardiac palpitations (neg work up Dr. Johnsie Cancel), and post menopause  She is S/P hysterectomy for "fibroids" 1985.  She does have both ovaries  She is having difficulty with hot flushes.  She was given Celexa by her GYN and this helped somewhat but she still flushes day and night.    She also has a nosebleed for abaout one week but it has stopped now  Allergies  Allergen Reactions  . Crab [Shellfish Allergy] Swelling    Blisters on tongue.  . Lipitor [Atorvastatin]     Muscle cramping  . Sulfa Drugs Cross Reactors    Past Medical History  Diagnosis Date  . Hypertension   . Hyperlipidemia   . Palpitations   . Cancer 2007  . Depression 01/24/2013  . Breast cancer 2008    with radiation finished 08, tamoxifen  . Vertigo    Past Surgical History  Procedure Laterality Date  . Breast lumpectomy  2007  . Abdominal hysterectomy  1985   History   Social History  . Marital Status: Married    Spouse Name: N/A    Number of Children: 0  . Years of Education: N/A   Occupational History  .     Social History Main Topics  . Smoking status: Never Smoker   . Smokeless tobacco: Never Used  . Alcohol Use: No  . Drug Use: No  . Sexual Activity: No     Comment: TAH   Other Topics Concern  . Not on file   Social History Narrative   No children   Married   Enjoys quilting and horseback riding.    She works in Pension scheme manager.   She completed 2 years of college.    Family History  Problem Relation Age of Onset  . Breast cancer    . Colon cancer    . Lung cancer Father     Died at 36 from lung cancer  . Heart disease  Father   . Hypertension Mother   . Kidney disease Mother   . Diabetes Mother   . Cancer Mother     colon cancer diagnosed at 34  . Coronary artery disease Brother 80    died of "massive heart attack"  . Diabetes Brother   . Hypertension Sister    Patient Active Problem List   Diagnosis Date Noted  . Menopausal hot flushes 08/17/2013  . Neck pain 08/03/2013  . Depression 01/24/2013  . GERD (gastroesophageal reflux disease) 01/24/2013  . Routine general medical examination at a health care facility 06/22/2012  . Other malaise and fatigue 05/10/2012  . Hot flashes 05/10/2012  . Unspecified constipation 05/10/2012  . Hypertension   . Hyperlipidemia   . Breast cancer 06/08/2011   Current Outpatient Prescriptions on File Prior to Visit  Medication Sig Dispense Refill  . Calcium Carbonate-Vitamin D (CALTRATE 600+D) 600-400 MG-UNIT per tablet Take 1 tablet by mouth daily.      . Cholecalciferol (VITAMIN D3) 1000 UNITS CAPS Take 1 capsule by mouth daily.       . citalopram (CELEXA) 40 MG  tablet Take 40 mg by mouth daily. For hotflashes      . Multiple Vitamin (MULTIVITAMIN) tablet Take 1 tablet by mouth daily.        . nitrofurantoin, macrocrystal-monohydrate, (MACROBID) 100 MG capsule Take 1 capsule PO as directed  30 capsule  3  . omeprazole (PRILOSEC OTC) 20 MG tablet Take 40 mg by mouth daily.       . pravastatin (PRAVACHOL) 40 MG tablet Take 1 tablet (40 mg total) by mouth daily.  30 tablet  5  . triamterene-hydrochlorothiazide (DYAZIDE) 37.5-25 MG per capsule Take 1 each (1 capsule total) by mouth every morning.  30 capsule  5   No current facility-administered medications on file prior to visit.      Review of Systems    see HPI  Objective:   Physical Exam Physical Exam  Nursing note and vitals reviewed.  Constitutional: She is oriented to person, place, and time. She appears well-developed and well-nourished.  HENT:  Head: Normocephalic and atraumatic.    Cardiovascular: Normal rate and regular rhythm. Exam reveals no gallop and no friction rub.  No murmur heard.  Pulmonary/Chest: Breath sounds normal. She has no wheezes. She has no rales.  Neurological: She is alert and oriented to person, place, and time.  Skin: Skin is warm and dry.  Psychiatric: She has a normal mood and affect. Her behavior is normal.              Assessment & Plan:    Menopausal hot flushes in setting of DCIS left breast  :  OK to try lexapro.  Discussed many options including Clonidine,  Neurontin and Brisdelle.   She would like to try Lexapro    Taper off Citalopram  Qod for one week and can try Lexapro 10 mg dialy.  Get cooling pillow for night time  Nosebleed  Will check CBC.  This seems to be resolved

## 2013-08-22 ENCOUNTER — Encounter: Payer: Self-pay | Admitting: *Deleted

## 2013-10-11 ENCOUNTER — Ambulatory Visit: Payer: BC Managed Care – PPO | Admitting: Internal Medicine

## 2013-10-16 ENCOUNTER — Encounter: Payer: Self-pay | Admitting: Internal Medicine

## 2013-10-16 ENCOUNTER — Ambulatory Visit (INDEPENDENT_AMBULATORY_CARE_PROVIDER_SITE_OTHER): Payer: BC Managed Care – PPO | Admitting: Internal Medicine

## 2013-10-16 VITALS — BP 117/61 | HR 93 | Temp 98.0°F | Resp 17 | Ht 64.0 in | Wt 202.0 lb

## 2013-10-16 DIAGNOSIS — R232 Flushing: Secondary | ICD-10-CM

## 2013-10-16 DIAGNOSIS — N951 Menopausal and female climacteric states: Secondary | ICD-10-CM | POA: Diagnosis not present

## 2013-10-16 MED ORDER — CITALOPRAM HYDROBROMIDE 40 MG PO TABS: 40.0000 mg | ORAL_TABLET | Freq: Every day | ORAL | Status: DC

## 2013-10-16 NOTE — Progress Notes (Signed)
Subjective:    Patient ID: Katelyn Porter, female    DOB: [birth year], 63 y.o.   MRN: [id number]  HPI Terrence Dupont is here for follow up of treatment for perimenopausal vasomotor flushing in the setting of DCIS.  She tells me that lexapro made her drowsy so she went back to citalopram 40 mg.    She says her flushes are much better at night and she is using the cooling pillow.    Still some symptoms during the day but it is tolerable  Allergies  Allergen Reactions  . Crab [Shellfish Allergy] Swelling    Blisters on tongue.  . Lipitor [Atorvastatin]     Muscle cramping  . Sulfa Drugs Cross Reactors    Past Medical History  Diagnosis Date  . Hypertension   . Hyperlipidemia   . Palpitations   . Cancer 2007  . Depression 01/24/2013  . Breast cancer 2008    with radiation finished 08, tamoxifen  . Vertigo    Past Surgical History  Procedure Laterality Date  . Breast lumpectomy  2007  . Abdominal hysterectomy  1985   History   Social History  . Marital Status: Married    Spouse Name: N/A    Number of Children: 0  . Years of Education: N/A   Occupational History  .     Social History Main Topics  . Smoking status: Never Smoker   . Smokeless tobacco: Never Used  . Alcohol Use: No  . Drug Use: No  . Sexual Activity: No     Comment: TAH   Other Topics Concern  . Not on file   Social History Narrative   No children   Married   Enjoys quilting and horseback riding.    She works in Pension scheme manager.   She completed 2 years of college.    Family History  Problem Relation Age of Onset  . Breast cancer    . Colon cancer    . Lung cancer Father     Died at 40 from lung cancer  . Heart disease Father   . Hypertension Mother   . Kidney disease Mother   . Diabetes Mother   . Cancer Mother     colon cancer diagnosed at 68  . Coronary artery disease Brother 49    died of "massive heart attack"  . Diabetes Brother   . Hypertension Sister    Patient Active Problem List   Diagnosis Date Noted  . Menopausal hot flushes 08/17/2013  . Neck pain 08/03/2013  . Depression 01/24/2013  . GERD (gastroesophageal reflux disease) 01/24/2013  . Routine general medical examination at a health care facility 06/22/2012  . Other malaise and fatigue 05/10/2012  . Hot flashes 05/10/2012  . Unspecified constipation 05/10/2012  . Hypertension   . Hyperlipidemia   . Breast cancer 06/08/2011   Current Outpatient Prescriptions on File Prior to Visit  Medication Sig Dispense Refill  . Calcium Carbonate-Vitamin D (CALTRATE 600+D) 600-400 MG-UNIT per tablet Take 1 tablet by mouth daily.      . Cholecalciferol (VITAMIN D3) 1000 UNITS CAPS Take 1 capsule by mouth daily.       . citalopram (CELEXA) 40 MG tablet Take 40 mg by mouth daily. For hotflashes      . escitalopram (LEXAPRO) 10 MG tablet Take 1 tablet (10 mg total) by mouth daily.  30 tablet  2  . Multiple Vitamin (MULTIVITAMIN) tablet Take 1 tablet by mouth daily.        Marland Kitchen  nitrofurantoin, macrocrystal-monohydrate, (MACROBID) 100 MG capsule Take 1 capsule PO as directed  30 capsule  3  . omeprazole (PRILOSEC OTC) 20 MG tablet Take 40 mg by mouth daily.       . pravastatin (PRAVACHOL) 40 MG tablet Take 1 tablet (40 mg total) by mouth daily.  30 tablet  5  . triamterene-hydrochlorothiazide (DYAZIDE) 37.5-25 MG per capsule Take 1 each (1 capsule total) by mouth every morning.  30 capsule  5   No current facility-administered medications on file prior to visit.       Review of Systems    see HPI Objective:   Physical Exam Physical Exam  Nursing note and vitals reviewed.  Constitutional: She is oriented to person, place, and time. She appears well-developed and well-nourished.  HENT:  Head: Normocephalic and atraumatic.  Cardiovascular: Normal rate and regular rhythm. Exam reveals no gallop and no friction rub.  No murmur heard.  Pulmonary/Chest: Breath sounds normal. She has no wheezes. She has no rales.    Neurological: She is alert and oriented to person, place, and time.  Skin: Skin is warm and dry.  Psychiatric: She has a normal mood and affect. Her behavior is normal.              Assessment & Plan:  Vasomotor flushing  Continue citalopram 40 mg  Rx given    Continue cooling pillow    See me as needed  She has primary care with [facility]

## 2013-10-23 ENCOUNTER — Ambulatory Visit: Payer: BC Managed Care – PPO | Admitting: Family

## 2013-12-29 ENCOUNTER — Encounter: Payer: Self-pay | Admitting: Internal Medicine

## 2013-12-29 ENCOUNTER — Encounter: Payer: Self-pay | Admitting: Family

## 2013-12-29 ENCOUNTER — Ambulatory Visit (INDEPENDENT_AMBULATORY_CARE_PROVIDER_SITE_OTHER): Payer: BC Managed Care – PPO | Admitting: Family

## 2013-12-29 VITALS — BP 160/78 | HR 58 | Temp 98.2°F | Resp 16 | Ht 63.0 in | Wt 206.0 lb

## 2013-12-29 DIAGNOSIS — R109 Unspecified abdominal pain: Secondary | ICD-10-CM | POA: Insufficient documentation

## 2013-12-29 DIAGNOSIS — R1013 Epigastric pain: Secondary | ICD-10-CM

## 2013-12-29 LAB — H. PYLORI ANTIBODY, IGG: H Pylori IgG: NEGATIVE

## 2013-12-29 MED ORDER — ONDANSETRON 4 MG PO TBDP: 4.0000 mg | ORAL_TABLET | Freq: Three times a day (TID) | ORAL | Status: DC | PRN

## 2013-12-29 NOTE — Progress Notes (Signed)
Pre visit review using our clinic review tool, if applicable. No additional management support is needed unless otherwise documented below in the visit note. 

## 2013-12-29 NOTE — Assessment & Plan Note (Addendum)
Will refer to GI, obtain HIDA scan to better evaluate her GB function, obtain H pylori IgG. Add zofran prn.  Continue PPI.    Addendum: HIDA scan normal.

## 2013-12-29 NOTE — Patient Instructions (Signed)
Please complete lab work prior to leaving. You may use zofran as needed for nausea. You will be contacted about scheduling your HIDA scan.  You will be contacted about your referral to Gastroenterology. Please follow up with [place] in 3 months, sooner if problems/concerns.

## 2013-12-29 NOTE — Addendum Note (Signed)
Addended by: Debbrah Alar on: 12/29/2013 04:06 PM   Modules accepted: Orders

## 2013-12-29 NOTE — Progress Notes (Addendum)
Subjective:    Patient ID: Katelyn Porter, female    DOB: August 21, 1950, 63 y.o.   MRN: [id number]  HPI  Katelyn Porter is a 63 yr old female who presents today with chief complaint of epigastric abdominal pain. She has been having abdominal pain intermittently since May. Pt reports that she has been taking 2 prilosec daily which initially helped. Notes that symptoms worsened on Wednesday night. Has been eating prunes which has helped her back pain. Pain started in the epigastric region and radiated around to her back. After she passed some gas it started to feel better.  Pain is worse after meals.  Better able to tolerated carbs, veggies.  + nausea, no vomiting. Denies black/bloody stools.  Did have [place] of abdomen back in May which was unremarkable.     Review of Systems See HPI  Past Medical History  Diagnosis Date  . Hypertension   . Hyperlipidemia   . Palpitations   . Cancer 2007  . Depression 01/24/2013  . Breast cancer 2008    with radiation finished 08, tamoxifen  . Vertigo     History   Social History  . Marital Status: Married    Spouse Name: N/A    Number of Children: 0  . Years of Education: N/A   Occupational History  .     Social History Main Topics  . Smoking status: Never Smoker   . Smokeless tobacco: Never Used  . Alcohol Use: No  . Drug Use: No  . Sexual Activity: No     Comment: TAH   Other Topics Concern  . Not on file   Social History Narrative   No children   Married   Enjoys quilting and horseback riding.    She works in Pension scheme manager.   She completed 2 years of college.     Past Surgical History  Procedure Laterality Date  . Breast lumpectomy  2007  . Abdominal hysterectomy  1985    Family History  Problem Relation Age of Onset  . Breast cancer    . Colon cancer    . Lung cancer Father     Died at 42 from lung cancer  . Heart disease Father   . Hypertension Mother   . Kidney disease Mother   . Diabetes Mother   . Cancer Mother    colon cancer diagnosed at 69  . Coronary artery disease Brother 41    died of "massive heart attack"  . Diabetes Brother   . Hypertension Sister     Allergies  Allergen Reactions  . Crab [Shellfish Allergy] Swelling    Blisters on tongue.  Marland Kitchen Lexapro [Escitalopram Oxalate] Other (See Comments)    drowsiness  . Lipitor [Atorvastatin]     Muscle cramping  . Sulfa Drugs Cross Reactors     Current Outpatient Prescriptions on File Prior to Visit  Medication Sig Dispense Refill  . Calcium Carbonate-Vitamin D (CALTRATE 600+D) 600-400 MG-UNIT per tablet Take 1 tablet by mouth daily.      . Cholecalciferol (VITAMIN D3) 1000 UNITS CAPS Take 1 capsule by mouth daily.       . citalopram (CELEXA) 40 MG tablet Take 1 tablet (40 mg total) by mouth daily. For hotflashes  90 tablet  1  . Multiple Vitamin (MULTIVITAMIN) tablet Take 1 tablet by mouth daily.        . nitrofurantoin, macrocrystal-monohydrate, (MACROBID) 100 MG capsule Take 1 capsule PO as directed  30 capsule  3  .  omeprazole (PRILOSEC OTC) 20 MG tablet Take 40 mg by mouth daily.       . pravastatin (PRAVACHOL) 40 MG tablet Take 1 tablet (40 mg total) by mouth daily.  30 tablet  5  . triamterene-hydrochlorothiazide (DYAZIDE) 37.5-25 MG per capsule Take 1 each (1 capsule total) by mouth every morning.  30 capsule  5   No current facility-administered medications on file prior to visit.    BP 160/78  Pulse 58  Temp(Src) 98.2 F (36.8 C) (Oral)  Resp 16  Ht 5\' 3"  (1.6 m)  Wt 206 lb (93.441 kg)  BMI 36.50 kg/m2  SpO2 100%  LMP 03/17/1983       Objective:   Physical Exam  Constitutional: She is oriented to person, place, and time. She appears well-developed and well-nourished. No distress.  Cardiovascular: Normal rate and regular rhythm.   No murmur heard. Pulmonary/Chest: Effort normal and breath sounds normal. No respiratory distress. She has no wheezes. She has no rales. She exhibits no tenderness.  Abdominal: Soft. She  exhibits no distension and no mass. There is no tenderness. There is no rebound and no guarding.  Neurological: She is alert and oriented to person, place, and time.  Psychiatric: She has a normal mood and affect. Her behavior is normal. Thought content normal.          Assessment & Plan:

## 2014-01-01 ENCOUNTER — Telehealth: Payer: Self-pay | Admitting: Family

## 2014-01-01 NOTE — Telephone Encounter (Signed)
Caller name: Terrence Dupont  Relation to pt: self  Call back number: [phone] [facility] [phone]    Reason for call:   Pt states she did not pick up ondansetron (ZOFRAN ODT) 4 MG disintegrating tablet due to the fact she is no longer feeling [place] and this medication will not help her.  Pt states she started taking nitrofurantoin, macrocrystal-monohydrate, (MACROBID) 100 MG capsule which is helping slighlety and would like a medication sent in similar to nitrofurantoin.  Pt states the first available appointment for GI is not until 12/15 and pt can not wait that long. Please advise

## 2014-01-01 NOTE — Telephone Encounter (Signed)
Notified pt and she voices understanding. Advised her referral coordinator will call her with new appt if they are able to work her in earlier.

## 2014-01-01 NOTE — Telephone Encounter (Signed)
Katelyn Porter- Please let pt know that I would not recommend any antibiotics at this time for her abdominal pain.  I will see if they are able to move up her GI appointment.   Anderson Malta, could you please see if one of the NP's/PA's can see the patient in the next few weeks.

## 2014-01-02 ENCOUNTER — Telehealth: Payer: Self-pay | Admitting: Family

## 2014-01-02 NOTE — Telephone Encounter (Signed)
Caller name: Terrence Dupont Relation to pt: self Call back number: [phone] Pharmacy:   Reason for call:   Patient is requesting last lab results.

## 2014-01-02 NOTE — Telephone Encounter (Signed)
Pt declined appt with PA/NP, please advise.

## 2014-01-02 NOTE — Telephone Encounter (Signed)
Notified pt and she voices understanding. Will let us know if she changes her mind about seeing a physician extender with GI.

## 2014-01-02 NOTE — Telephone Encounter (Signed)
I would recommend that she keep apt with MD in December if she is unwilling to work with the NP or PA.

## 2014-01-02 NOTE — Telephone Encounter (Signed)
Results given per 12/29/13 lab letter.

## 2014-01-04 ENCOUNTER — Encounter: Payer: Self-pay | Admitting: Family

## 2014-01-04 ENCOUNTER — Encounter (HOSPITAL_COMMUNITY)
Admission: RE | Admit: 2014-01-04 | Discharge: 2014-01-04 | Disposition: A | Discharge status: Home / Self Care | Payer: BC Managed Care – PPO | Source: Ambulatory Visit | Attending: Family | Admitting: Family

## 2014-01-04 DIAGNOSIS — R1013 Epigastric pain: Secondary | ICD-10-CM

## 2014-01-04 MED ORDER — SINCALIDE 5 MCG IJ SOLR
0.0200 ug/kg | Freq: Once | INTRAMUSCULAR | Status: AC
  Administered 2014-01-04: 1.87 ug via INTRAVENOUS

## 2014-01-04 MED ORDER — SINCALIDE 5 MCG IJ SOLR
INTRAMUSCULAR | Status: AC
  Filled 2014-01-04: qty 5

## 2014-01-04 MED ORDER — STERILE WATER FOR INJECTION IJ SOLN
INTRAMUSCULAR | Status: AC
  Filled 2014-01-04: qty 10

## 2014-01-04 MED ORDER — TECHNETIUM TC 99M MEBROFENIN IV KIT
5.0000 | PACK | Freq: Once | INTRAVENOUS | Status: AC | PRN
  Administered 2014-01-04: 5 via INTRAVENOUS

## 2014-01-15 ENCOUNTER — Encounter: Payer: Self-pay | Admitting: Family

## 2014-03-06 ENCOUNTER — Ambulatory Visit: Payer: BC Managed Care – PPO | Admitting: Internal Medicine

## 2014-03-29 ENCOUNTER — Encounter: Payer: Self-pay | Admitting: Family

## 2014-03-29 DIAGNOSIS — K219 Gastro-esophageal reflux disease without esophagitis: Secondary | ICD-10-CM

## 2014-04-02 ENCOUNTER — Other Ambulatory Visit: Payer: Self-pay

## 2014-04-02 DIAGNOSIS — Z1231 Encounter for screening mammogram for malignant neoplasm of breast: Secondary | ICD-10-CM

## 2014-04-06 ENCOUNTER — Other Ambulatory Visit: Payer: Self-pay | Admitting: Internal Medicine

## 2014-04-09 ENCOUNTER — Ambulatory Visit: Payer: Self-pay | Admitting: Family

## 2014-04-09 ENCOUNTER — Telehealth: Payer: Self-pay | Admitting: *Deleted

## 2014-04-09 NOTE — Telephone Encounter (Signed)
Refill request

## 2014-04-09 NOTE — Telephone Encounter (Signed)
Pt cancelled appointment 04/09/14 at 4:15pm for 3 month follow up.  Pt will not be charged no show fee for 04/09/14 due to weather.

## 2014-04-10 MED ORDER — CITALOPRAM HYDROBROMIDE 20 MG PO TABS: 20.0000 mg | ORAL_TABLET | Freq: Every day | ORAL | Status: DC

## 2014-04-10 NOTE — Telephone Encounter (Signed)
I spoke with Terrence Dupont in regards to her meds and made her a F/U appt. -eh

## 2014-04-10 NOTE — Telephone Encounter (Signed)
Katelyn Porter  Call this pt and let her know I lowered her dose of Celexa because one of her medications interferes with it.   She should take 20 mg and not 40 mg   Give her a 30 min office visit with me in the next 4 weeks let her know I want to do an EKG on ehr

## 2014-04-20 ENCOUNTER — Encounter: Payer: Self-pay | Admitting: Obstetrics & Gynecology

## 2014-04-23 ENCOUNTER — Ambulatory Visit: Payer: 59 | Admitting: Obstetrics & Gynecology

## 2014-04-23 ENCOUNTER — Telehealth: Payer: Self-pay | Admitting: Obstetrics & Gynecology

## 2014-04-23 NOTE — Telephone Encounter (Signed)
Patient came in for an AEX today but cancelled due to her insurance and financial reasons. FYI only. She is in recall for AEX.

## 2014-04-25 ENCOUNTER — Encounter: Payer: Self-pay | Admitting: Internal Medicine

## 2014-04-25 ENCOUNTER — Telehealth: Payer: Self-pay | Admitting: *Deleted

## 2014-04-25 ENCOUNTER — Ambulatory Visit (INDEPENDENT_AMBULATORY_CARE_PROVIDER_SITE_OTHER): Payer: 59 | Admitting: Internal Medicine

## 2014-04-25 VITALS — BP 136/60 | HR 76 | Ht 62.0 in | Wt 207.0 lb

## 2014-04-25 DIAGNOSIS — R1013 Epigastric pain: Secondary | ICD-10-CM

## 2014-04-25 DIAGNOSIS — K219 Gastro-esophageal reflux disease without esophagitis: Secondary | ICD-10-CM

## 2014-04-25 DIAGNOSIS — K5901 Slow transit constipation: Secondary | ICD-10-CM

## 2014-04-25 MED ORDER — ESOMEPRAZOLE MAGNESIUM 40 MG PO CPDR: 40.0000 mg | DELAYED_RELEASE_CAPSULE | Freq: Every day | ORAL | Status: DC

## 2014-04-25 NOTE — Telephone Encounter (Signed)
error 

## 2014-04-25 NOTE — Patient Instructions (Signed)
  We have sent the following medications to your pharmacy for you to pick up at your convenience: Nexium   You have been scheduled for an endoscopy. Please follow written instructions given to you at your visit today. If you use inhalers (even only as needed), please bring them with you on the day of your procedure. Your physician has requested that you go to www.startemmi.com and enter the access code given to you at your visit today. This web site gives a general overview about your procedure. However, you should still follow specific instructions given to you by our office regarding your preparation for the procedure.  

## 2014-04-25 NOTE — Progress Notes (Signed)
HISTORY OF PRESENT ILLNESS:  Katelyn Porter is a 64 y.o. female with the below listed medical history who is sent today upon referral by her primary care physician regarding epigastric pain, heartburn, and constipation. I saw the patient in August 2007 when she underwent screening colonoscopy. She has a family history of colon cancer in her parent at age 26 and first cousin. The examination was normal. She underwent a repeat colonoscopy in [place] 07/18/2010. This was also normal. Follow-up in 5 years recommended. The patient presents today with a one-year history of epigastric discomfort and pyrosis. Also intermittent right upper quadrant discomfort. She mentions nausea and bloating. She has also had intermittent constipation over the same timeframe. There has been no GI bleeding, weight loss, or lower abdominal pain. She did undergo abdominal ultrasound in April 2014 for right upper quadrant pain. The examination was normal. Repeat abdominal ultrasound for the same complaint in May 2015 was also normal. CBC in June 2015 and H. pylori antibody in October 2015 were normal. She was also scheduled for HIDA scan October 2015. This was normal. She is now referred. The patient tells me that she try Prilosec OTC for her reflux symptoms. This did not seem to help. She subsequently has been using ranitidine on demand with transient improvement. She did report taking a Nexium from her husband was helpful. The patient rarely uses aspirin. No other NSAIDs. She denies dysphagia. With regards to her epigastric discomfort she does notice transient relief with meals. No other exacerbating or relieving factors identified.  REVIEW OF SYSTEMS:  All non-GI ROS negative except for night sweats, nosebleeds, urinary leakage  Past Medical History  Diagnosis Date  . Hypertension   . Hyperlipidemia   . Palpitations   . Depression 01/24/2013  . Breast cancer 2008    left, with radiation finished 08, tamoxifen   . Vertigo   . IBS (irritable bowel syndrome)     Past Surgical History  Procedure Laterality Date  . Breast lumpectomy Left 2007  . Abdominal hysterectomy  1985    TAH (ovaries remain)  . Tonsillectomy      age 3    Social History Katelyn Porter  reports that she has never smoked. She has never used smokeless tobacco. She reports that she does not drink alcohol or use illicit drugs.  family history includes Breast cancer in her maternal aunt; Colon cancer in her cousin; Colon cancer (age of onset: 40) in her mother; Coronary artery disease (age of onset: 42) in her brother; Diabetes in her brother, mother, and sister; Heart disease in her father; Hypertension in her mother and sister; Kidney disease in her mother; Lung cancer in her father.  Allergies  Allergen Reactions  . Crab [Shellfish Allergy] Swelling    Blisters on tongue.  Marland Kitchen Lexapro [Escitalopram Oxalate] Other (See Comments)    drowsiness  . Lipitor [Atorvastatin]     Muscle cramping  . Sulfa Drugs Cross Reactors        PHYSICAL EXAMINATION: Vital signs: BP 136/60 mmHg  Pulse 76  Ht 5\' 2"  (1.575 m)  Wt 207 lb (93.895 kg)  BMI 37.85 kg/m2  LMP 03/17/1983  Constitutional: Obese, otherwise generally well-appearing, no acute distress Psychiatric: alert and oriented x3, cooperative Eyes: extraocular movements intact, anicteric, conjunctiva pink Mouth: oral pharynx moist, no lesions Neck: supple no lymphadenopathy Cardiovascular: heart regular rate and rhythm, no murmur Lungs: clear to auscultation bilaterally Abdomen: soft, obese, nontender, nondistended, no obvious ascites, no peritoneal  signs, normal bowel sounds, no organomegaly Extremities: no lower extremity edema bilaterally Skin: no lesions on visible extremities Neuro: No focal deficits.   ASSESSMENT:  #1. GERD. Ongoing symptoms with ranitidine on demand #2. Epigastric and right upper quadrant discomfort. Possibly related to GERD #3. Family history of  colon cancer and parent (advanced age) and cousin. Negative colonoscopy 2007 and 2012. Based on current guidelines, reasonable for next screening exam 2022 #4. Functional constipation. Recommended increased fiber and water   PLAN:  #1. Reflux precautions and GERD reviewed with patient #2. Literature on GERD and reflux precautions also provided #3. Upper endoscopy to evaluate chronic GERD and chronic abdominal pain.The nature of the procedure, as well as the risks, benefits, and alternatives were carefully and thoroughly reviewed with the patient. Ample time for discussion and questions allowed. The patient understood, was satisfied, and agreed to proceed. #4. Prescribe Nexium 40 mg daily #5. Repeat screening colonoscopy 2022 #6. Ongoing general medical care with Debbrah Alar

## 2014-04-27 ENCOUNTER — Telehealth: Payer: Self-pay | Admitting: Internal Medicine

## 2014-04-27 NOTE — Telephone Encounter (Signed)
Caller name: Terrence Dupont Call back number: [phone] Pharmacy: [place], [place]  Reason for call: Pt is calling again regarding this.Marland Kitchenand she needs esomeprazole ([place]) 40 MG  [facility] purposes.

## 2014-04-30 MED ORDER — ESOMEPRAZOLE MAGNESIUM 40 MG PO CPDR: 40.0000 mg | DELAYED_RELEASE_CAPSULE | Freq: Every day | ORAL | Status: DC

## 2014-04-30 NOTE — Telephone Encounter (Signed)
Script sent to [facility].  

## 2014-05-02 ENCOUNTER — Telehealth: Payer: Self-pay | Admitting: Internal Medicine

## 2014-05-02 ENCOUNTER — Ambulatory Visit (AMBULATORY_SURGERY_CENTER): Payer: 59 | Admitting: Internal Medicine

## 2014-05-02 ENCOUNTER — Encounter: Payer: Self-pay | Admitting: Internal Medicine

## 2014-05-02 VITALS — BP 141/76 | HR 79 | Temp 98.4°F | Resp 12 | Ht 62.0 in | Wt 207.0 lb

## 2014-05-02 DIAGNOSIS — K219 Gastro-esophageal reflux disease without esophagitis: Secondary | ICD-10-CM

## 2014-05-02 DIAGNOSIS — K2100 Gastro-esophageal reflux disease with esophagitis, without bleeding: Secondary | ICD-10-CM

## 2014-05-02 DIAGNOSIS — R1013 Epigastric pain: Secondary | ICD-10-CM

## 2014-05-02 MED ORDER — SODIUM CHLORIDE 0.9 % IV SOLN: 500.0000 mL | INTRAVENOUS | Status: DC

## 2014-05-02 NOTE — Progress Notes (Signed)
A/ox3 pleased with MAC, report to Tracy RN 

## 2014-05-02 NOTE — Patient Instructions (Signed)
Impressions/recommendations:  Esophagitis (handout given)  Nexium 40 mg daily. Please call and schedule and office visit with Dr. Henrene Pastor in two months.  YOU HAD AN ENDOSCOPIC PROCEDURE TODAY AT [facility]: Refer to the procedure report that was given to you for any specific questions about what was found during the examination.  If the procedure report does not answer your questions, please call your gastroenterologist to clarify.  If you requested that your care partner not be given the details of your procedure findings, then the procedure report has been included in a sealed envelope for you to review at your convenience later.  YOU SHOULD EXPECT: Some feelings of bloating in the abdomen. Passage of more gas than usual.  Walking can help get rid of the air that was put into your GI tract during the procedure and reduce the bloating. If you had a lower endoscopy (such as a colonoscopy or flexible sigmoidoscopy) you may notice spotting of blood in your stool or on the toilet paper. If you underwent a bowel prep for your procedure, then you may not have a normal bowel movement for a few days.  DIET: Your first meal following the procedure should be a light meal and then it is ok to progress to your normal diet.  A half-sandwich or bowl of soup is an example of a good first meal.  Heavy or fried foods are harder to digest and may make you feel nauseous or bloated.  Likewise meals heavy in dairy and vegetables can cause extra gas to form and this can also increase the bloating.  Drink plenty of fluids but you should avoid alcoholic beverages for 24 hours.  ACTIVITY: Your care partner should take you home directly after the procedure.  You should plan to take it easy, moving slowly for the rest of the day.  You can resume normal activity the day after the procedure however you should NOT DRIVE or use heavy machinery for 24 hours (because of the sedation medicines used during the test).     SYMPTOMS TO REPORT IMMEDIATELY: A gastroenterologist can be reached at any hour.  During normal business hours, 8:30 AM to 5:00 PM Monday through Friday, call [phone].  After hours and on weekends, please call the GI answering service at [phone] who will take a message and have the physician on call contact you.   Following upper endoscopy (EGD)  Vomiting of blood or coffee ground material  New chest pain or pain under the shoulder blades  Painful or persistently difficult swallowing  New shortness of breath  Fever of 100F or higher  Black, tarry-looking stools  FOLLOW UP: If any biopsies were taken you will be contacted by phone or by letter within the next 1-3 weeks.  Call your gastroenterologist if you have not heard about the biopsies in 3 weeks.  Our staff will call the home number listed on your records the next business day following your procedure to check on you and address any questions or concerns that you may have at that time regarding the information given to you following your procedure. This is a courtesy call and so if there is no answer at the home number and we have not heard from you through the emergency physician on call, we will assume that you have returned to your regular daily activities without incident.  SIGNATURES/CONFIDENTIALITY: You and/or your care partner have signed paperwork which will be entered into your electronic medical record.  These  signatures attest to the fact that that the information above on your After Visit Summary has been reviewed and is understood.  Full responsibility of the confidentiality of this discharge information lies with you and/or your care-partner. 

## 2014-05-02 NOTE — Progress Notes (Signed)
Patient coughing and clearing throat frequently, states she feels she a bit of a tickle.

## 2014-05-02 NOTE — Op Note (Signed)
[place]  [address]. [place], [address]   ENDOSCOPY PROCEDURE REPORT  PATIENT: Katelyn Porter, Katelyn Porter  MR#: [id number] BIRTHDATE: 1950-07-26 , 63  yrs. old GENDER: female ENDOSCOPIST: Eustace Quail, MD REFERRED BY:  Office Self, M.D. PROCEDURE DATE:  05/02/2014 PROCEDURE:  EGD, diagnostic ASA CLASS:     Class II INDICATIONS:  epigastric pain and history of esophageal reflux. MEDICATIONS: Monitored anesthesia care and Propofol 120 mg IV TOPICAL ANESTHETIC: none  DESCRIPTION OF PROCEDURE: After the risks benefits and alternatives of the procedure were thoroughly explained, informed consent was obtained.  The LB [id number] endoscope was introduced through the mouth and advanced to the second portion of the duodenum , Without limitations.  The instrument was slowly withdrawn as the mucosa was fully examined.    EXAM:Esophagus revealed distal esophagitis as manifested by friability and edema of the mucosal Z line.  The stomach was normal.  The duodenum was normal.  Retroflexed views revealed a hiatal hernia.     The scope was then withdrawn from the patient and the procedure completed.  COMPLICATIONS: There were no immediate complications.  ENDOSCOPIC IMPRESSION: 1. GERD with endoscopic evidence of esophagitis 2. Otherwise normal EGD  RECOMMENDATIONS: 1.  Anti-reflux regimen to be followed (please see information provided by nurse) 2.  Pickup Nexium (already called in) and begin 40 mg daily 3. Office follow-up with Dr. Henrene Pastor in 2 months  REPEAT EXAM:  eSigned:  Eustace Quail, MD 05/02/2014 9:20 AM    [id number] Coralyn Mark, MD and The Patient

## 2014-05-03 ENCOUNTER — Telehealth: Payer: Self-pay

## 2014-05-03 ENCOUNTER — Telehealth: Payer: Self-pay | Admitting: *Deleted

## 2014-05-03 MED ORDER — PANTOPRAZOLE SODIUM 40 MG PO TBEC: 40.0000 mg | DELAYED_RELEASE_TABLET | Freq: Every day | ORAL | Status: DC

## 2014-05-03 NOTE — Telephone Encounter (Signed)
Spoke with patient and she decided to try Pantoprazole, which is one of the PPI's covered by her insurance.  Sent it to [facility] and she will try it and let me know how it does.  Patient agreed

## 2014-05-03 NOTE — Telephone Encounter (Signed)
Left message for patient that her Nexium is not covered by her insurance and we needed to discuss some other PPI options that are covered

## 2014-05-03 NOTE — Telephone Encounter (Signed)
  Follow up Call-  Call back number 05/02/2014  Post procedure Call Back phone  # [phone]  Permission to leave phone message Yes     Patient questions:  Do you have a fever, pain , or abdominal swelling? No. Pain Score  0 *  Have you tolerated food without any problems? Yes.    Have you been able to return to your normal activities? Yes.    Do you have any questions about your discharge instructions: Diet   No. Medications  No. Follow up visit  No.  Do you have questions or concerns about your Care? No.  Actions: * If pain score is 4 or above: No action needed, pain <4.

## 2014-05-08 NOTE — Telephone Encounter (Signed)
Sent Pantoprazole for Patient to try after Esomeprazole denial.

## 2014-05-11 DIAGNOSIS — I10 Essential (primary) hypertension: Secondary | ICD-10-CM

## 2014-05-11 HISTORY — DX: Essential (primary) hypertension: I10

## 2014-05-14 ENCOUNTER — Ambulatory Visit: Payer: Self-pay | Admitting: Internal Medicine

## 2014-05-17 ENCOUNTER — Ambulatory Visit
Admission: RE | Admit: 2014-05-17 | Discharge: 2014-05-17 | Disposition: A | Discharge status: Home / Self Care | Payer: 59 | Source: Ambulatory Visit

## 2014-05-17 DIAGNOSIS — Z1231 Encounter for screening mammogram for malignant neoplasm of breast: Secondary | ICD-10-CM

## 2014-05-31 NOTE — Telephone Encounter (Signed)
Yes.  Thank you.

## 2014-05-31 NOTE — Telephone Encounter (Signed)
Okay to close encounter?  °

## 2014-07-03 ENCOUNTER — Ambulatory Visit (INDEPENDENT_AMBULATORY_CARE_PROVIDER_SITE_OTHER): Payer: 59 | Admitting: Internal Medicine

## 2014-07-03 ENCOUNTER — Encounter: Payer: Self-pay | Admitting: Internal Medicine

## 2014-07-03 VITALS — BP 148/70 | HR 76 | Ht 62.0 in | Wt 206.6 lb

## 2014-07-03 DIAGNOSIS — K2100 Gastro-esophageal reflux disease with esophagitis, without bleeding: Secondary | ICD-10-CM

## 2014-07-03 DIAGNOSIS — R1013 Epigastric pain: Secondary | ICD-10-CM

## 2014-07-03 MED ORDER — PANTOPRAZOLE SODIUM 40 MG PO TBEC: 40.0000 mg | DELAYED_RELEASE_TABLET | Freq: Every day | ORAL | Status: DC

## 2014-07-03 NOTE — Patient Instructions (Signed)
We have sent the following medications to your pharmacy for you to pick up at your convenience:  Pantoprazole  Please follow up with Dr. Henrene Pastor in one year

## 2014-07-03 NOTE — Progress Notes (Signed)
HISTORY OF PRESENT ILLNESS:  Katelyn Porter is a 64 y.o. female with past medical history as listed below. She was evaluated in the office 04/25/2014 regarding upper abdominal discomfort and GERD despite H2 receptor antagonist therapy. See that dictation for details. She subsequently underwent upper endoscopy 05/02/2014. She was found to have mild reflux esophagitis. Otherwise normal EGD. She was placed on PPI therapy, currently pantoprazole 40 mg daily. She presents today for follow-up. She is pleased to report complete resolution of symptoms. Improved appetite. No medication related side effects or other complaints today. She is up-to-date with colon cancer screening.  REVIEW OF SYSTEMS:  All non-GI ROS negative except for urinary leakage  Past Medical History  Diagnosis Date  . Hypertension   . Hyperlipidemia   . Palpitations   . Depression 01/24/2013  . Breast cancer 2008    left, with radiation finished 08, tamoxifen  . Vertigo   . IBS (irritable bowel syndrome)   . GERD (gastroesophageal reflux disease)     Past Surgical History  Procedure Laterality Date  . Breast lumpectomy Left 2007  . Abdominal hysterectomy  1985    TAH (ovaries remain)  . Tonsillectomy      age 36    Social History Katelyn Porter  reports that she has never smoked. She has never used smokeless tobacco. She reports that she does not drink alcohol or use illicit drugs.  family history includes Breast cancer in her maternal aunt; Colon cancer in her cousin; Colon cancer (age of onset: 26) in her mother; Coronary artery disease (age of onset: 15) in her brother; Diabetes in her brother, mother, and sister; Heart disease in her father; Hypertension in her mother and sister; Kidney disease in her mother; Lung cancer in her father.  Allergies  Allergen Reactions  . Crab [Shellfish Allergy] Swelling    Blisters on tongue.  . Sulfa Antibiotics Swelling  . Lexapro [Escitalopram Oxalate] Other (See Comments)   drowsiness  . Lipitor [Atorvastatin]     Muscle cramping  . Sulfa Drugs Cross Reactors        PHYSICAL EXAMINATION: Vital signs: BP 148/70 mmHg  Pulse 76  Ht 5\' 2"  (1.575 m)  Wt 206 lb 9.6 oz (93.713 kg)  BMI 37.78 kg/m2  LMP 03/17/1983 General: Well-developed, well-nourished, no acute distress HEENT: Sclerae are anicteric, conjunctiva pink. Oral mucosa intact Lungs: Clear Heart: Regular Abdomen: soft, nontender, nondistended, no obvious ascites, no peritoneal signs, normal bowel sounds. No organomegaly. Extremities: No edema Psychiatric: alert and oriented x3. Cooperative   ASSESSMENT:  #1. GERD. Symptoms resolved on PPI #2. Colon cancer screening. Up-to-date   PLAN:  #1. I reviewed with her the pathophysiology of reflux disease as well as its treatment and expected outcomes. We also discussed Reflux precautions with attention to weight loss #2. Continue pantoprazole 40 mg daily. Prescription with multiple refills submitted #3. Surveillance colonoscopy 2022 #4. GI office follow-up in 1 year. Sooner if needed  20 minutes spent face-to-face with this patient. Greater than 50% of the time used to counsel regarding GERD as described above

## 2015-03-27 ENCOUNTER — Other Ambulatory Visit: Payer: Self-pay

## 2015-03-27 DIAGNOSIS — Z1231 Encounter for screening mammogram for malignant neoplasm of breast: Secondary | ICD-10-CM

## 2015-05-20 ENCOUNTER — Ambulatory Visit
Admission: RE | Admit: 2015-05-20 | Discharge: 2015-05-20 | Disposition: A | Discharge status: Home / Self Care | Payer: BLUE CROSS/BLUE SHIELD | Source: Ambulatory Visit

## 2015-05-20 DIAGNOSIS — Z1231 Encounter for screening mammogram for malignant neoplasm of breast: Secondary | ICD-10-CM

## 2015-08-01 ENCOUNTER — Other Ambulatory Visit: Payer: Self-pay | Admitting: Internal Medicine

## 2015-08-02 ENCOUNTER — Telehealth: Payer: Self-pay | Admitting: Internal Medicine

## 2015-08-02 MED ORDER — PANTOPRAZOLE SODIUM 40 MG PO TBEC: 40.0000 mg | DELAYED_RELEASE_TABLET | Freq: Every day | ORAL | Status: DC

## 2015-08-02 NOTE — Telephone Encounter (Signed)
Refilled Pantoprazole 

## 2015-09-11 ENCOUNTER — Encounter: Payer: Self-pay | Admitting: Internal Medicine

## 2015-09-11 ENCOUNTER — Ambulatory Visit (INDEPENDENT_AMBULATORY_CARE_PROVIDER_SITE_OTHER): Payer: BLUE CROSS/BLUE SHIELD | Admitting: Internal Medicine

## 2015-09-11 VITALS — BP 142/70 | HR 76 | Ht 62.0 in | Wt 203.1 lb

## 2015-09-11 DIAGNOSIS — IMO0001 Reserved for inherently not codable concepts without codable children: Secondary | ICD-10-CM

## 2015-09-11 DIAGNOSIS — K219 Gastro-esophageal reflux disease without esophagitis: Secondary | ICD-10-CM

## 2015-09-11 DIAGNOSIS — R143 Flatulence: Secondary | ICD-10-CM | POA: Diagnosis not present

## 2015-09-11 MED ORDER — PANTOPRAZOLE SODIUM 40 MG PO TBEC: 40.0000 mg | DELAYED_RELEASE_TABLET | Freq: Every day | ORAL | Status: DC

## 2015-09-11 NOTE — Patient Instructions (Signed)
We have sent the following medications to your pharmacy for you to pick up at your convenience:  Protonix  Please follow up in one year 

## 2015-09-11 NOTE — Progress Notes (Signed)
HISTORY OF PRESENT ILLNESS:  Katelyn Porter is a 65 y.o. female with past medical history as listed below. She was last evaluated in the office 07/03/2014 for ongoing management of chronic GERD. See that dictation. Upper endoscopy prior to that visit revealed mild reflux esophagitis but was otherwise normal. Since that time she has been on PPI therapy. At the time of her last visit reflux symptoms resolved on PPI. She has continued on PPI but reports significant reflux symptoms off medication. No dysphagia. Her on the other new complaint is that of increased intestinal gas and bloating. No associated abdominal pain, weight loss, or change in bowel habits. She denies new medications or change in diet. No obvious exacerbating or relieving factors though somewhat worse postprandially in general. Abdominal ultrasound 2 years ago was negative. Her last colonoscopy performed in [facility] May 2012 was normal. The exam was complete with excellent preparation documented.  REVIEW OF SYSTEMS:  All non-GI ROS negative except for fatigue, occasional epistaxis  Past Medical History  Diagnosis Date  . Hypertension   . Hyperlipidemia   . Palpitations   . Depression 01/24/2013  . Breast cancer ([place]) 2008    left, with radiation finished 08, tamoxifen  . Vertigo   . IBS (irritable bowel syndrome)   . GERD (gastroesophageal reflux disease)     Past Surgical History  Procedure Laterality Date  . Breast lumpectomy Left 2007  . Abdominal hysterectomy  1985    TAH (ovaries remain)  . Tonsillectomy      age 52    Social History Katelyn Porter  reports that she has never smoked. She has never used smokeless tobacco. She reports that she does not drink alcohol or use illicit drugs.  family history includes Breast cancer in her maternal aunt; Colon cancer in her cousin; Colon cancer (age of onset: 54) in her mother; Coronary artery disease (age of onset: 18) in her brother; Diabetes in her brother,  mother, and sister; Heart disease in her father; Hypertension in her mother and sister; Kidney disease in her mother; Lung cancer in her father.  Allergies  Allergen Reactions  . Crab [Shellfish Allergy] Swelling    Blisters on tongue.  . Sulfa Antibiotics Swelling  . Lexapro [Escitalopram Oxalate] Other (See Comments)    drowsiness  . Lipitor [Atorvastatin]     Muscle cramping  . Sulfa Drugs Cross Reactors        PHYSICAL EXAMINATION: Vital signs: BP 142/70 mmHg  Pulse 76  Ht 5\' 2"  (1.575 m)  Wt 203 lb 2 oz (92.137 kg)  BMI 37.14 kg/m2  LMP 03/17/1983  Constitutional: generally well-appearing, no acute distress Psychiatric: alert and oriented x3, cooperative Eyes: extraocular movements intact, anicteric, conjunctiva pink Mouth: oral pharynx moist, no lesions Neck: supple no lymphadenopathy Cardiovascular: heart regular rate and rhythm, no murmur Lungs: clear to auscultation bilaterally Abdomen: soft,Obese, nontender, nondistended, no obvious ascites, no peritoneal signs, normal bowel sounds, no organomegaly Rectal: Omitted, Extremities: no clubbing cyanosis or lower extremity edema bilaterally Skin: no lesions on visible extremities Neuro: No focal deficits. Cranial nerves intact  ASSESSMENT:  1. GERD. Previous upper endoscopy with mild esophagitis. Required PPI to control symptoms. Failed H2 receptor antagonist therapy previously 2. Normal colonoscopy 2012 3. Increased intestinal gas and bloating 4. Obesity. This could contribute to abdominal bloating discomfort  PLAN:  1. Reflux precautions with attention to weight loss 2. Refill pantoprazole 40 mg daily 3. Discussion on intestinal gas and bloating 4. Provided  her with literature on intestinal gas as well as anti-gas and flatulence dietary sheet 5. Routine repeat screening colonoscopy 2022 6. Routine GI office follow-up one year  25 minutes was spent face-to-face with the patient. Greater than 50% a time use for  counseling regarding GERD and intestinal gas

## 2016-02-05 ENCOUNTER — Other Ambulatory Visit: Payer: Self-pay | Admitting: Obstetrics & Gynecology

## 2016-02-05 DIAGNOSIS — Z1231 Encounter for screening mammogram for malignant neoplasm of breast: Secondary | ICD-10-CM

## 2016-02-24 ENCOUNTER — Emergency Department (HOSPITAL_BASED_OUTPATIENT_CLINIC_OR_DEPARTMENT_OTHER)
Admission: EM | Admit: 2016-02-24 | Discharge: 2016-02-24 | Disposition: A | Discharge status: Home / Self Care | Payer: 59 | Attending: Emergency Medicine | Admitting: Emergency Medicine

## 2016-02-24 ENCOUNTER — Emergency Department (HOSPITAL_BASED_OUTPATIENT_CLINIC_OR_DEPARTMENT_OTHER): Payer: 59

## 2016-02-24 ENCOUNTER — Encounter (HOSPITAL_BASED_OUTPATIENT_CLINIC_OR_DEPARTMENT_OTHER): Payer: Self-pay | Admitting: *Deleted

## 2016-02-24 DIAGNOSIS — Z79899 Other long term (current) drug therapy: Secondary | ICD-10-CM | POA: Insufficient documentation

## 2016-02-24 DIAGNOSIS — R110 Nausea: Secondary | ICD-10-CM | POA: Insufficient documentation

## 2016-02-24 DIAGNOSIS — R109 Unspecified abdominal pain: Secondary | ICD-10-CM | POA: Diagnosis present

## 2016-02-24 DIAGNOSIS — Z853 Personal history of malignant neoplasm of breast: Secondary | ICD-10-CM | POA: Insufficient documentation

## 2016-02-24 DIAGNOSIS — R1011 Right upper quadrant pain: Secondary | ICD-10-CM | POA: Diagnosis not present

## 2016-02-24 DIAGNOSIS — I10 Essential (primary) hypertension: Secondary | ICD-10-CM | POA: Insufficient documentation

## 2016-02-24 LAB — URINALYSIS, ROUTINE W REFLEX MICROSCOPIC
Bilirubin Urine: NEGATIVE
GLUCOSE, UA: NEGATIVE mg/dL
Hgb urine dipstick: NEGATIVE
Ketones, ur: NEGATIVE mg/dL
LEUKOCYTES UA: NEGATIVE
Nitrite: NEGATIVE
PH: 6 (ref 5.0–8.0)
Protein, ur: NEGATIVE mg/dL
Specific Gravity, Urine: 1.021 (ref 1.005–1.030)

## 2016-02-24 LAB — COMPREHENSIVE METABOLIC PANEL
ALK PHOS: 74 U/L (ref 38–126)
ALT: 17 U/L (ref 14–54)
AST: 17 U/L (ref 15–41)
Albumin: 4 g/dL (ref 3.5–5.0)
Anion gap: 7 (ref 5–15)
BILIRUBIN TOTAL: 0.4 mg/dL (ref 0.3–1.2)
BUN: 16 mg/dL (ref 6–20)
CALCIUM: 9 mg/dL (ref 8.9–10.3)
CHLORIDE: 108 mmol/L (ref 101–111)
CO2: 26 mmol/L (ref 22–32)
CREATININE: 0.9 mg/dL (ref 0.44–1.00)
GFR calc Af Amer: >60 mL/min (ref >60)
Glucose, Bld: 99 mg/dL (ref 65–99)
Potassium: 3.7 mmol/L (ref 3.5–5.1)
Sodium: 141 mmol/L (ref 135–145)
Total Protein: 6.8 g/dL (ref 6.5–8.1)

## 2016-02-24 LAB — CBC WITH DIFFERENTIAL/PLATELET
BASOS PCT: 1 %
Basophils Absolute: 0 10*3/uL (ref 0.0–0.1)
EOS ABS: 0.2 10*3/uL (ref 0.0–0.7)
EOS PCT: 3 %
HCT: 39.4 % (ref 36.0–46.0)
Hemoglobin: 12.7 g/dL (ref 12.0–15.0)
LYMPHS ABS: 3 10*3/uL (ref 0.7–4.0)
Lymphocytes Relative: 43 %
MCH: 29.4 pg (ref 26.0–34.0)
MCHC: 32.2 g/dL (ref 30.0–36.0)
MCV: 91.2 fL (ref 78.0–100.0)
Monocytes Absolute: 0.6 10*3/uL (ref 0.1–1.0)
Monocytes Relative: 9 %
Neutro Abs: 3 10*3/uL (ref 1.7–7.7)
Neutrophils Relative %: 44 %
PLATELETS: 268 10*3/uL (ref 150–400)
RBC: 4.32 MIL/uL (ref 3.87–5.11)
RDW: 12.9 % (ref 11.5–15.5)
WBC: 6.9 10*3/uL (ref 4.0–10.5)

## 2016-02-24 LAB — LIPASE, BLOOD: LIPASE: 22 U/L (ref 11–51)

## 2016-02-24 LAB — TROPONIN I: Troponin I: <0.03 ng/mL (ref <0.03)

## 2016-02-24 MED ORDER — METOCLOPRAMIDE HCL 10 MG PO TABS: 10.0000 mg | ORAL_TABLET | Freq: Four times a day (QID) | ORAL | 0 refills | Status: DC | PRN

## 2016-02-24 NOTE — ED Triage Notes (Signed)
Pt c/o right flank pain x 1 week , nausea only.

## 2016-02-24 NOTE — ED Provider Notes (Signed)
[place] [facility]PT [facility] Provider Note   CSN: [id number] Arrival date & time: 02/24/16  1717   By signing my name below, I, Katelyn Porter, attest that this documentation has been prepared under the direction and in the presence of Orlie Dakin, MD . Electronically Signed: Neta Porter, ED Scribe. 02/24/2016. 6:37 PM.   History   Chief Complaint Chief Complaint  Patient presents with  . Flank Pain   The history is provided by the patient. No language interpreter was used.   HPI Comments:  Katelyn Porter is a 65 y.o. female with PMHx of breast cancer and HTN who presents to the Emergency Department complaining of Constant right flank pain x 1 month. Pt states that the pain is primarily under her right breast and that yesterday, the pain became significantly worse. No fever. Admits to nausea without vomiting. Pain is exacerbated by eating, nonexertional. No shortness of breath. Pt describes her current pain as a mild ache. Pt complains of associated nausea. Pt last had a BM this morning and states that it was normal in consistency. Pt states that the pain is exacerbated when eating. Pt's last meal was noon today. Pt does not smoke or drink. Pt states that normally tylenol provides relief, but it has not provided relief for the past 2 days. Pt denies fever, vomiting, fever. Denies urinary symptoms  PCP: Helane Rima, MD   Past Medical History:  Diagnosis Date  . Breast cancer ([place]) 2008   left, with radiation finished 08, tamoxifen  . Depression 01/24/2013  . GERD (gastroesophageal reflux disease)   . Hyperlipidemia   . Hypertension   . IBS (irritable bowel syndrome)   . Palpitations   . Vertigo     Patient Active Problem List   Diagnosis Date Noted  . Abdominal pain 12/29/2013  . Menopausal hot flushes 08/17/2013  . Neck pain 08/03/2013  . Depression 01/24/2013  . GERD (gastroesophageal reflux disease) 01/24/2013  . Routine general medical examination at a  health care facility 06/22/2012  . Other malaise and fatigue 05/10/2012  . Hot flashes 05/10/2012  . Unspecified constipation 05/10/2012  . Hypertension   . Hyperlipidemia   . Breast cancer ([place]) 06/08/2011    Past Surgical History:  Procedure Laterality Date  . ABDOMINAL HYSTERECTOMY  1985   TAH (ovaries remain)  . BREAST LUMPECTOMY Left 2007  . TONSILLECTOMY     age 19    OB History    Gravida Para Term Preterm AB Living   1 0       0   SAB TAB Ectopic Multiple Live Births                   Home Medications    Prior to Admission medications   Medication Sig Start Date End Date Taking? Authorizing Provider  Calcium Carbonate (CALCIUM 600 PO) Take 1 tablet by mouth daily.    Historical Provider, MD  Cholecalciferol (VITAMIN D3) 1000 UNITS CAPS Take 1 capsule by mouth daily.     Historical Provider, MD  Multiple Vitamins-Minerals (ONE-A-DAY WOMENS 50 PLUS PO) Take 1 tablet by mouth daily.    Historical Provider, MD  nitrofurantoin (MACRODANTIN) 100 MG capsule Take 1 capsule by mouth as needed. 08/29/14   Historical Provider, MD  pantoprazole (PROTONIX) 40 MG tablet Take 1 tablet (40 mg total) by mouth daily. 09/11/15   Irene Shipper, MD  pravastatin (PRAVACHOL) 40 MG tablet Take 1 tablet (40 mg total) by mouth daily. 05/08/13  Debbrah Alar, NP  sertraline (ZOLOFT) 100 MG tablet Take 1 tablet by mouth daily. 06/13/15   Historical Provider, MD    Family History Family History  Problem Relation Age of Onset  . Lung cancer Father     Died at 25 from lung cancer  . Heart disease Father     heart attack  . Colon cancer Mother 17  . Hypertension Mother   . Kidney disease Mother   . Diabetes Mother   . Diabetes Brother   . Breast cancer Maternal Aunt     maternal aunt and maternal 1st cousin  . Coronary artery disease Brother 53    died of "massive heart attack"  . Hypertension Sister   . Colon cancer Cousin     maternal 1st cousin  . Diabetes Sister     Social  History Social History  Substance Use Topics  . Smoking status: Never Smoker  . Smokeless tobacco: Never Used  . Alcohol use No     Allergies   Crab [shellfish allergy]; Sulfa antibiotics; Lexapro [escitalopram oxalate]; Lipitor [atorvastatin]; and Sulfa drugs cross reactors   Review of Systems Review of Systems  Constitutional: Negative.   HENT: Negative.   Respiratory: Negative.   Cardiovascular: Negative.   Gastrointestinal: Positive for abdominal pain and nausea.  Genitourinary: Positive for flank pain.       Right flank pain  Skin: Negative.   Neurological: Negative.   Psychiatric/Behavioral: Negative.   All other systems reviewed and are negative.    Physical Exam Updated Vital Signs BP 171/68   Pulse 71   Temp 98.1 F (36.7 C) (Oral)   Resp 18   Ht 5' 3.5" (1.613 m)   Wt 198 lb (89.8 kg)   LMP 03/17/1983   SpO2 95%   BMI 34.52 kg/m   Physical Exam  Constitutional: She appears well-developed and well-nourished.  HENT:  Head: Normocephalic and atraumatic.  Eyes: Conjunctivae are normal. Pupils are equal, round, and reactive to light.  Neck: Neck supple. No tracheal deviation present. No thyromegaly present.  Cardiovascular: Normal rate and regular rhythm.   No murmur heard. Pulmonary/Chest: Effort normal and breath sounds normal.  Abdominal: Soft. Bowel sounds are normal. She exhibits no distension. There is tenderness.  Obese and minimally tender at right upper quadrant no guarding or rigidity. Negative Murphy sign.  Genitourinary:  Genitourinary Comments: No flank tenderness  Musculoskeletal: Normal range of motion. She exhibits no edema or tenderness.  Neurological: She is alert. Coordination normal.  Skin: Skin is warm and dry. No rash noted.  Psychiatric: She has a normal mood and affect.  Nursing note and vitals reviewed.    ED Treatments / Results  DIAGNOSTIC STUDIES:  Oxygen Saturation is 95% on RA, normal by my interpretation.     Results for orders placed or performed during the hospital encounter of 02/24/16  Urinalysis, Routine w reflex microscopic  Result Value Ref Range   Color, Urine YELLOW YELLOW   APPearance CLEAR CLEAR   Specific Gravity, Urine 1.021 1.005 - 1.030   pH 6.0 5.0 - 8.0   Glucose, UA NEGATIVE NEGATIVE mg/dL   Hgb urine dipstick NEGATIVE NEGATIVE   Bilirubin Urine NEGATIVE NEGATIVE   Ketones, ur NEGATIVE NEGATIVE mg/dL   Protein, ur NEGATIVE NEGATIVE mg/dL   Nitrite NEGATIVE NEGATIVE   Leukocytes, UA NEGATIVE NEGATIVE  Comprehensive metabolic panel  Result Value Ref Range   Sodium 141 135 - 145 mmol/L   Potassium 3.7 3.5 - 5.1 mmol/L  Chloride 108 101 - 111 mmol/L   CO2 26 22 - 32 mmol/L   Glucose, Bld 99 65 - 99 mg/dL   BUN 16 6 - 20 mg/dL   Creatinine, Ser 0.90 0.44 - 1.00 mg/dL   Calcium 9.0 8.9 - 10.3 mg/dL   Total Protein 6.8 6.5 - 8.1 g/dL   Albumin 4.0 3.5 - 5.0 g/dL   AST 17 15 - 41 U/L   ALT 17 14 - 54 U/L   Alkaline Phosphatase 74 38 - 126 U/L   Total Bilirubin 0.4 0.3 - 1.2 mg/dL   GFR calc non Af Amer >60 >60 mL/min   GFR calc Af Amer >60 >60 mL/min   Anion gap 7 5 - 15  CBC with Differential/Platelet  Result Value Ref Range   WBC 6.9 4.0 - 10.5 K/uL   RBC 4.32 3.87 - 5.11 MIL/uL   Hemoglobin 12.7 12.0 - 15.0 g/dL   HCT 39.4 36.0 - 46.0 %   MCV 91.2 78.0 - 100.0 fL   MCH 29.4 26.0 - 34.0 pg   MCHC 32.2 30.0 - 36.0 g/dL   RDW 12.9 11.5 - 15.5 %   Platelets 268 150 - 400 K/uL   Neutrophils Relative % 44 %   Neutro Abs 3.0 1.7 - 7.7 K/uL   Lymphocytes Relative 43 %   Lymphs Abs 3.0 0.7 - 4.0 K/uL   Monocytes Relative 9 %   Monocytes Absolute 0.6 0.1 - 1.0 K/uL   Eosinophils Relative 3 %   Eosinophils Absolute 0.2 0.0 - 0.7 K/uL   Basophils Relative 1 %   Basophils Absolute 0.0 0.0 - 0.1 K/uL  Lipase, blood  Result Value Ref Range   Lipase 22 11 - 51 U/L  Troponin I  Result Value Ref Range   Troponin I <0.03 <0.03 ng/mL   US Abdomen Limited  Ruq  Result Date: 02/24/2016 CLINICAL DATA:  Right upper quadrant pain for 1 month with nausea EXAM: US ABDOMEN LIMITED - RIGHT UPPER QUADRANT COMPARISON:  07/17/2013 FINDINGS: Gallbladder: No gallstones or wall thickening visualized. No sonographic Murphy sign noted by sonographer. Common bile duct: Diameter: 2.9 mm Liver: Mildly increased echogenicity likely related to fatty infiltration. IMPRESSION: Fatty liver.  No acute abnormality is noted. Electronically Signed   By: Inez Catalina M.D.   On: 02/24/2016 19:16   COORDINATION OF CARE:  6:37 PM Discussed treatment plan with pt at bedside and pt agreed to plan.   Labs (all labs ordered are listed, but only abnormal results are displayed) Labs Reviewed  URINALYSIS, ROUTINE W REFLEX MICROSCOPIC    EKG  EKG Interpretation None       Radiology No results found.  Procedures Procedures (including critical care time)  Medications Ordered in ED Medications - No data to display   Initial Impression / Assessment and Plan / ED Course  I have reviewed the triage vital signs and the nursing notes.  Pertinent labs & imaging results that were available during my care of the patient were reviewed by me and considered in my medical decision making (see chart for details).  Clinical Course     8:15 PM asymptomatic except for mild nausea. Declines antiemetics in ED. Plan prescription Reglan. Keep scheduled appointment with Dr. Henrene Pastor, gastroenterologist or she can also follow up with her PCP Dr. Lavone Neri  Final Clinical Impressions(s) / ED Diagnoses  Diagnosis #1 right flank pain #2 right upper quadrant pain #3 nausea without vomiting Final diagnoses:  None  New Prescriptions New Prescriptions   No medications on file       Orlie Dakin, MD 02/24/16 2025

## 2016-02-24 NOTE — ED Notes (Signed)
Pt c/o right side back pain that wraps around to RUQ for the last month.  It was made worse last night after taking tylenol.  Pt c/o intermittent nausea, denies vomiting, denies fever.

## 2016-02-24 NOTE — ED Notes (Signed)
Pt back from US

## 2016-02-24 NOTE — ED Notes (Signed)
Unaware of why the EKG was not done, this EMT just came on shift at 1900.

## 2016-02-24 NOTE — Discharge Instructions (Signed)
Keep your scheduled appointment with Dr. Henrene Pastor or you can call Dr.Howell for follow-up if not feeling improved in a week. Tell the office staff that you were seen here and had an ultrasound of your abdomen as well as lab work performed here. They can check on the results .Return here if your condition worsens for any reason. Take the medication prescribed as needed for nausea.

## 2016-02-24 NOTE — ED Notes (Signed)
Pt verbalizes understanding of d/c instructions and denies any further need at this time. 

## 2016-02-24 NOTE — ED Notes (Signed)
Patient transported to Ultrasound 

## 2016-03-20 ENCOUNTER — Ambulatory Visit (INDEPENDENT_AMBULATORY_CARE_PROVIDER_SITE_OTHER): Payer: PRIVATE HEALTH INSURANCE | Admitting: Internal Medicine

## 2016-03-20 ENCOUNTER — Encounter: Payer: Self-pay | Admitting: Internal Medicine

## 2016-03-20 VITALS — BP 150/62 | HR 72 | Ht 62.0 in | Wt 214.4 lb

## 2016-03-20 DIAGNOSIS — K219 Gastro-esophageal reflux disease without esophagitis: Secondary | ICD-10-CM | POA: Diagnosis not present

## 2016-03-20 DIAGNOSIS — R195 Other fecal abnormalities: Secondary | ICD-10-CM

## 2016-03-20 MED ORDER — PANTOPRAZOLE SODIUM 40 MG PO TBEC: 40.0000 mg | DELAYED_RELEASE_TABLET | Freq: Every day | ORAL | 11 refills | Status: DC

## 2016-03-20 NOTE — Progress Notes (Signed)
HISTORY OF PRESENT ILLNESS:  Katelyn Porter is a 66 y.o. female  with past medical history as listed below who presents today with a chief complaint of postprandial bloating. Patient was last evaluated 09/11/2015 for GERD and increased intestinal gas with bloating. See that dictation. She presents today reporting an uncomfortable fullness or bloating sensation after meals. More problematic toward days and. Since her last visit she has gained 10 pounds. Upper endoscopy February 2016 revealed mild esophagitis but was otherwise normal. For her acid reflux she continues on pantoprazole. She requests a refill. She was seen in the emergency room in December for right upper quadrant pain. I have reviewed that encounter. Laboratories and ultrasound were unremarkable. She was told this was likely musculoskeletal. The pain has resolved. Her only other complaint is that of intermittent dark stools over the past few weeks. Her last hemoglobin level was normal. Last colonoscopy elsewhere in May 2012 was normal. There is a family history of colon cancer in her mother, but this was greater than age 47. Patient reports regular bowel habits.  REVIEW OF SYSTEMS:  All non-GI ROS negative except for night sweats, excessive thirst, fatigue  Past Medical History:  Diagnosis Date  . Breast cancer ([place]) 2008   left, with radiation finished 08, tamoxifen  . Depression 01/24/2013  . GERD (gastroesophageal reflux disease)   . Hyperlipidemia   . Hypertension   . IBS (irritable bowel syndrome)   . Palpitations   . Vertigo     Past Surgical History:  Procedure Laterality Date  . ABDOMINAL HYSTERECTOMY  1985   TAH (ovaries remain)  . BREAST LUMPECTOMY Left 2007  . TONSILLECTOMY     age 50    Social History JAINE KLINGSHIRN  reports that she has never smoked. She has never used smokeless tobacco. She reports that she does not drink alcohol or use drugs.  family history includes Breast cancer in her maternal aunt; Colon  cancer in her cousin; Colon cancer (age of onset: 85) in her mother; Coronary artery disease (age of onset: 49) in her brother; Diabetes in her brother, mother, and sister; Heart disease in her father; Hypertension in her mother and sister; Kidney disease in her mother; Lung cancer in her father.  Allergies  Allergen Reactions  . Crab [Shellfish Allergy] Swelling    Blisters on tongue.  . Sulfa Antibiotics Swelling  . Lexapro [Escitalopram Oxalate] Other (See Comments)    drowsiness  . Lipitor [Atorvastatin]     Muscle cramping  . Sulfa Drugs Cross Reactors        PHYSICAL EXAMINATION: Vital signs: BP (!) 150/62 (BP Location: Right Arm, Patient Position: Sitting, Cuff Size: Normal)   Pulse 72   Ht 5\' 2"  (1.575 m)   Wt 214 lb 6 oz (97.2 kg)   LMP 03/17/1983   BMI 39.21 kg/m   Constitutional: generally well-appearing, no acute distress Psychiatric:Pleasant, alert and oriented x3, cooperative Eyes: extraocular movements intact, anicteric, conjunctiva pink Mouth: oral pharynx moist, no lesions Neck: supple no lymphadenopathy Cardiovascular: heart regular rate and rhythm, no murmur Lungs: clear to auscultation bilaterally Abdomen: soft,Obese, nontender, nondistended, no obvious ascites, no peritoneal signs, normal bowel sounds, no organomegaly Rectal: Omitted Extremities: no clubbing cyanosis or lower extremity edema bilaterally Skin: no lesions on visible extremities Neuro: No focal deficits. Cranial nerves intact  ASSESSMENT:  #1. Bloating. No alarm features #2. Obesity #3. GERD. Mild esophagitis on EGD February 2017 #4. "Dark stools". #5. Normal colonoscopy 2012   PLAN:  #  1. Discussion on bloating #2. Weight loss and exercise. Recommend that she consult with a nutritionist and exercise professional #3. Reflux precautions #4. Continue PPI. Most dose to control symptoms. Refilled the prescription #5. Stool Hemoccults to evaluate dark stool #6. Routine GI follow-up one  year. Sooner if needed.

## 2016-03-20 NOTE — Patient Instructions (Signed)
We have sent the following medications to your pharmacy for you to pick up at your convenience:  Protonix   Please complete the hemoccult cards and return to our office.

## 2016-04-08 ENCOUNTER — Other Ambulatory Visit (INDEPENDENT_AMBULATORY_CARE_PROVIDER_SITE_OTHER): Payer: PRIVATE HEALTH INSURANCE

## 2016-04-08 DIAGNOSIS — R195 Other fecal abnormalities: Secondary | ICD-10-CM | POA: Diagnosis not present

## 2016-04-08 LAB — HEMOCCULT SLIDES (X 3 CARDS)
OCCULT 1: NEGATIVE
OCCULT 2: NEGATIVE
OCCULT 3: NEGATIVE
OCCULT 4: NEGATIVE

## 2016-04-20 ENCOUNTER — Emergency Department (HOSPITAL_BASED_OUTPATIENT_CLINIC_OR_DEPARTMENT_OTHER): Payer: 59

## 2016-04-20 ENCOUNTER — Emergency Department (HOSPITAL_BASED_OUTPATIENT_CLINIC_OR_DEPARTMENT_OTHER)
Admission: EM | Admit: 2016-04-20 | Discharge: 2016-04-20 | Disposition: A | Discharge status: Home / Self Care | Payer: 59 | Attending: Emergency Medicine | Admitting: Emergency Medicine

## 2016-04-20 ENCOUNTER — Encounter (HOSPITAL_BASED_OUTPATIENT_CLINIC_OR_DEPARTMENT_OTHER): Payer: Self-pay

## 2016-04-20 DIAGNOSIS — J189 Pneumonia, unspecified organism: Secondary | ICD-10-CM

## 2016-04-20 DIAGNOSIS — J181 Lobar pneumonia, unspecified organism: Secondary | ICD-10-CM | POA: Insufficient documentation

## 2016-04-20 DIAGNOSIS — J111 Influenza due to unidentified influenza virus with other respiratory manifestations: Secondary | ICD-10-CM

## 2016-04-20 DIAGNOSIS — Z853 Personal history of malignant neoplasm of breast: Secondary | ICD-10-CM | POA: Insufficient documentation

## 2016-04-20 DIAGNOSIS — Z79899 Other long term (current) drug therapy: Secondary | ICD-10-CM | POA: Insufficient documentation

## 2016-04-20 DIAGNOSIS — I10 Essential (primary) hypertension: Secondary | ICD-10-CM | POA: Insufficient documentation

## 2016-04-20 MED ORDER — AZITHROMYCIN 250 MG PO TABS: ORAL_TABLET | ORAL | 0 refills | Status: DC

## 2016-04-20 MED ORDER — OSELTAMIVIR PHOSPHATE 75 MG PO CAPS: 75.0000 mg | ORAL_CAPSULE | Freq: Two times a day (BID) | ORAL | 0 refills | Status: DC

## 2016-04-20 MED ORDER — ACETAMINOPHEN 325 MG PO TABS
650.0000 mg | ORAL_TABLET | Freq: Once | ORAL | Status: AC
  Administered 2016-04-20: 650 mg via ORAL
  Filled 2016-04-20: qty 2

## 2016-04-20 NOTE — Discharge Instructions (Signed)
Stay hydrated.   Continue tylenol, motrin for fevers.   Take zpack as prescribed.   Take tamiflu twice daily for 5 days.   Stay out of work for 2 days.   See your doctor   Return to ER if you have vomiting, trouble breathing, severe headaches, dehydration.

## 2016-04-20 NOTE — ED Provider Notes (Signed)
[facility] Provider Note   CSN: [id number] Arrival date & time: 04/20/16  1142   By signing my name below, I, Delton Prairie, attest that this documentation has been prepared under the direction and in the presence of Drenda Freeze, MD  Electronically Signed: Delton Prairie, ED Scribe. 04/20/16. 3:43 PM.   History   Chief Complaint Chief Complaint  Patient presents with  . Cough   The history is provided by the patient. No language interpreter was used.   HPI Comments:  Katelyn Porter is a 66 y.o. female, with a hx of GERD on pantoprazole who presents to the Emergency Department complaining of a dry cough onset yesterday. Pt also reports a headache and subjective fever. Pt has a fever (tmax 100.5) in the ED. She has taken tylenol at home and was given motrin in the ED with relief. Pt denies nausea, vomiting, diarrhea and recent sick contacts diagnosed with the flu. She did have her flu shot this year. She also notes daily pravastatin use. No other associated symptoms noted.   Past Medical History:  Diagnosis Date  . Breast cancer ([place]) 2008   left, with radiation finished 08, tamoxifen  . Depression 01/24/2013  . GERD (gastroesophageal reflux disease)   . Hyperlipidemia   . Hypertension   . IBS (irritable bowel syndrome)   . Palpitations   . Vertigo     Patient Active Problem List   Diagnosis Date Noted  . Abdominal pain 12/29/2013  . Menopausal hot flushes 08/17/2013  . Neck pain 08/03/2013  . Depression 01/24/2013  . GERD (gastroesophageal reflux disease) 01/24/2013  . Routine general medical examination at a health care facility 06/22/2012  . Other malaise and fatigue 05/10/2012  . Hot flashes 05/10/2012  . Unspecified constipation 05/10/2012  . Hypertension   . Hyperlipidemia   . Breast cancer ([place]) 06/08/2011    Past Surgical History:  Procedure Laterality Date  . ABDOMINAL HYSTERECTOMY  1985   TAH (ovaries remain)  . BREAST LUMPECTOMY Left 2007    . TONSILLECTOMY     age 59    OB History    Gravida Para Term Preterm AB Living   1 0       0   SAB TAB Ectopic Multiple Live Births                   Home Medications    Prior to Admission medications   Medication Sig Start Date End Date Taking? Authorizing Provider  Cholecalciferol (VITAMIN D3) 1000 UNITS CAPS Take 1 capsule by mouth daily.     Historical Provider, MD  Esomeprazole Magnesium (NEXIUM 24HR PO) Take 1 tablet by mouth as needed.    Historical Provider, MD  Multiple Vitamins-Minerals (ONE-A-DAY WOMENS 50 PLUS PO) Take 1 tablet by mouth daily.    Historical Provider, MD  nitrofurantoin (MACRODANTIN) 100 MG capsule Take 1 capsule by mouth as needed. 08/29/14   Historical Provider, MD  pantoprazole (PROTONIX) 40 MG tablet Take 1 tablet (40 mg total) by mouth daily. 03/20/16   Irene Shipper, MD  pravastatin (PRAVACHOL) 40 MG tablet Take 1 tablet (40 mg total) by mouth daily. 05/08/13   Debbrah Alar, NP  sertraline (ZOLOFT) 100 MG tablet Take 1 tablet by mouth daily. 06/13/15   Historical Provider, MD    Family History Family History  Problem Relation Age of Onset  . Lung cancer Father     Died at 78 from lung cancer  . Heart disease  Father     heart attack  . Colon cancer Mother 69  . Hypertension Mother   . Kidney disease Mother   . Diabetes Mother   . Diabetes Brother   . Breast cancer Maternal Aunt     maternal aunt and maternal 1st cousin  . Coronary artery disease Brother 92    died of "massive heart attack"  . Hypertension Sister   . Colon cancer Cousin     maternal 1st cousin  . Diabetes Sister     Social History Social History  Substance Use Topics  . Smoking status: Never Smoker  . Smokeless tobacco: Never Used  . Alcohol use No     Allergies   Crab [shellfish allergy]; Sulfa antibiotics; Lexapro [escitalopram oxalate]; Lipitor [atorvastatin]; and Sulfa drugs cross reactors   Review of Systems Review of Systems  Constitutional:  Positive for fever.  Respiratory: Positive for cough.   Gastrointestinal: Negative for diarrhea, nausea and vomiting.  Neurological: Positive for headaches.  All other systems reviewed and are negative.  Physical Exam Updated Vital Signs BP 138/73   Pulse 106   Temp 99.2 F (37.3 C) (Oral)   Resp 20   Ht 5\' 4"  (1.626 m)   Wt 199 lb (90.3 kg)   LMP 03/17/1983   SpO2 99%   BMI 34.16 kg/m   Physical Exam  Constitutional: She is oriented to person, place, and time. She appears well-developed and well-nourished. No distress.  HENT:  Head: Normocephalic and atraumatic.  Eyes: EOM are normal.  Neck: Normal range of motion.  Cardiovascular: Normal rate, regular rhythm and normal heart sounds.   Pulmonary/Chest: Effort normal.  Minimal crackles on the right lung base   Abdominal: Soft. She exhibits no distension. There is no tenderness.  Musculoskeletal: Normal range of motion.  Neurological: She is alert and oriented to person, place, and time.  Skin: Skin is warm and dry.  Psychiatric: She has a normal mood and affect. Judgment normal.  Nursing note and vitals reviewed.   ED Treatments / Results  DIAGNOSTIC STUDIES:  Oxygen Saturation is 99% on RA, normal by my interpretation.    COORDINATION OF CARE:  3:34 PM Discussed treatment plan with pt at bedside and pt agreed to plan.  Labs (all labs ordered are listed, but only abnormal results are displayed) Labs Reviewed - No data to display  EKG  EKG Interpretation None       Radiology Dg Chest 2 View  Result Date: 04/20/2016 CLINICAL DATA:  Cough and headache today. EXAM: CHEST  2 VIEW COMPARISON:  PA and lateral chest 04/26/2006. FINDINGS: The lungs are clear. Heart size is normal. No pneumothorax or pleural effusion. IMPRESSION: No acute disease. Electronically Signed   By: Inge Rise M.D.   On: 04/20/2016 12:16    Procedures Procedures (including critical care time)  Medications Ordered in  ED Medications  acetaminophen (TYLENOL) tablet 650 mg (650 mg Oral Given 04/20/16 1159)     Initial Impression / Assessment and Plan / ED Course  I have reviewed the triage vital signs and the nursing notes.  Pertinent labs & imaging results that were available during my care of the patient were reviewed by me and considered in my medical decision making (see chart for details).    Katelyn Porter is a 66 y.o. female here with cough, headaches, fever. No meningeal signs. Was tachycardic and febrile initially and fever and tachycardia improved after motrin. ? Crackles R lung base. CXR clear. Appears  well. I think likely flu syndrome vs early atypical pneumonia. Will give zpack. Also discussed benefits and side effects of tamiflu and patient within window for tamiflu so will prescribe 5 day treatment course. Recommend continue tylenol, motrin, hydration at home.     Final Clinical Impressions(s) / ED Diagnoses   Final diagnoses:  None    New Prescriptions New Prescriptions   No medications on file  I personally performed the services described in this documentation, which was scribed in my presence. The recorded information has been reviewed and is accurate.     Drenda Freeze, MD 04/20/16 [phone]

## 2016-04-20 NOTE — ED Triage Notes (Signed)
Flu s/s x 2 days-NAD-steady gait

## 2016-05-01 ENCOUNTER — Ambulatory Visit: Payer: Self-pay | Admitting: Obstetrics & Gynecology

## 2016-05-25 ENCOUNTER — Ambulatory Visit: Payer: BLUE CROSS/BLUE SHIELD

## 2016-06-08 NOTE — Progress Notes (Signed)
66 y.o. G1P0 Married [place] American F here for annual exam.  Doing well although she's had several "issues" in the last few months.  Having some intermittent low back pain as well as having the flu in February.  She was seen in the ER in December for RUQ pain and she had a RUQ ultrasound.    She reports she restarted the citalopram but now if having a lot of fatigue.  She has noted a lot more yawning as well.  She had fatigue with lexapro and she thinks this has worsened since restarting this.  Denies vaginal bleeding.   Has new PCP, cannot recall name.  Had appt in early May.   Patient's last menstrual period was 03/17/1983.          Sexually active: No.  The current method of family planning is status post hysterectomy.    Exercising: No.  The patient does not participate in regular exercise at present. Smoker:  no  Health Maintenance: Pap:  2007  History of abnormal Pap:  no MMG:  05/20/15 BIRADS 1 negative.  Scheduled for 06/15/16. Colonoscopy: 07/18/10- repeat 5 years but she saw Dr. Henrene Pastor 1/18 and he only recommended doing stool cards for blood.  This testing was negative.   BMD: 2007     TDaP:  11/15/11  Pneumonia vaccine(s):  never Zostavax:   never Hep C testing: drawn today Screening Labs: fasting labs drawn today, Hb today: same, Urine today: unable to void at this time, but states she is having some discomfort with urination    reports that she has never smoked. She has never used smokeless tobacco. She reports that she does not drink alcohol or use drugs.  Past Medical History:  Diagnosis Date  . Breast cancer ([place]) 2008   left, with radiation finished 08, tamoxifen  . Depression 01/24/2013  . GERD (gastroesophageal reflux disease)   . Hyperlipidemia   . Hypertension   . IBS (irritable bowel syndrome)   . Palpitations   . Vertigo     Past Surgical History:  Procedure Laterality Date  . ABDOMINAL HYSTERECTOMY  1985   TAH (ovaries remain)  . BREAST LUMPECTOMY Left  2007  . TONSILLECTOMY     age 65    Current Outpatient Prescriptions  Medication Sig Dispense Refill  . Cholecalciferol (VITAMIN D3) 1000 UNITS CAPS Take 1 capsule by mouth daily.     . hydroxypropyl methylcellulose / hypromellose (ISOPTO TEARS / GONIOVISC) 2.5 % ophthalmic solution 1 drop as needed for dry eyes.    . Multiple Vitamins-Minerals (ONE-A-DAY WOMENS 50 PLUS PO) Take 1 tablet by mouth daily.    . pantoprazole (PROTONIX) 40 MG tablet Take 1 tablet (40 mg total) by mouth daily. 30 tablet 11  . pravastatin (PRAVACHOL) 40 MG tablet Take 1 tablet (40 mg total) by mouth daily. 30 tablet 5  . triamterene-hydrochlorothiazide (DYAZIDE) 37.5-25 MG capsule Take by mouth.     No current facility-administered medications for this visit.     Family History  Problem Relation Age of Onset  . Lung cancer Father     Died at 64 from lung cancer  . Heart disease Father     heart attack  . Colon cancer Mother 36  . Hypertension Mother   . Kidney disease Mother   . Diabetes Mother   . Diabetes Brother   . Breast cancer Maternal Aunt     maternal aunt and maternal 1st cousin  . Coronary artery disease Brother 36  died of "massive heart attack"  . Hypertension Sister   . Colon cancer Cousin     maternal 1st cousin  . Diabetes Sister     ROS:  Pertinent items are noted in HPI.  Otherwise, a comprehensive ROS was negative.  Exam:   BP 116/66 (BP Location: Right Arm, Patient Position: Sitting, Cuff Size: Normal)   Pulse 76   Resp 14   Ht 5' 2.5" (1.588 m)   Wt 212 lb (96.2 kg)   LMP 03/17/1983   BMI 38.16 kg/m   Weight change: +10#   Height: 5' 2.5" (158.8 cm)  Ht Readings from Last 3 Encounters:  06/09/16 5' 2.5" (1.588 m)  04/20/16 5\' 4"  (1.626 m)  03/20/16 5\' 2"  (1.575 m)    General appearance: alert, cooperative and appears stated age Head: Normocephalic, without obvious abnormality, atraumatic Neck: no adenopathy, supple, symmetrical, trachea midline and thyroid normal  to inspection and palpation Lungs: clear to auscultation bilaterally Breasts: normal appearance, no masses or tenderness, (on right), left breast with well healed scar with firmness related to radiation changes.  Stable from prior year. Heart: regular rate and rhythm Abdomen: soft, non-tender; bowel sounds normal; no masses,  no organomegaly Extremities: extremities normal, atraumatic, no cyanosis or edema Skin: Skin color, texture, turgor normal. No rashes or lesions Lymph nodes: Cervical, supraclavicular, and axillary nodes normal. No abnormal inguinal nodes palpated Neurologic: Grossly normal   Pelvic: External genitalia:  no lesions              Urethra:  normal appearing urethra with no masses, tenderness or lesions              Bartholins and Skenes: normal                 Vagina: normal appearing vagina with normal color and discharge, no lesions              Cervix: absent              Pap taken: No. Bimanual Exam:  Uterus:  uterus absent              Adnexa: no mass, fullness, tenderness               Rectovaginal: Confirms               Anus:  normal sphincter tone, no lesions  Chaperone was present for exam.  A:  Well Woman with normal exam Breast cancer 2008, released from oncology (saw Dr. [name]) Family hx of colon cancer (mother aged 57) Hypertension H/O depression with fatigue related possibly to citalopram use Elevated lipids H/O UTIs with negative urology evaluation in 2011 with Dr. Terance Hart H/O [place], ovaries remain  P:   Mammogram guidelines reviewed.  D/w pt 3 D MMG.  She will call and change app tot this. pap smear not indicated Hep C, Lipids, TSH, Vit D obtained RX for Wellbutrin 150XL daily.  Directions for changing medication from Citalopram to this given.  Recheck 4-6 weeks. Rx for nitrofurantoin 100mg  bid for 3 days with UTI.  #30/0RF.  This was recommended by Dr. Terance Hart return annually or prn

## 2016-06-09 ENCOUNTER — Ambulatory Visit (INDEPENDENT_AMBULATORY_CARE_PROVIDER_SITE_OTHER): Payer: Managed Care, Other (non HMO) | Admitting: Obstetrics & Gynecology

## 2016-06-09 ENCOUNTER — Encounter: Payer: Self-pay | Admitting: Obstetrics & Gynecology

## 2016-06-09 VITALS — BP 116/66 | HR 76 | Resp 14 | Ht 62.5 in | Wt 212.0 lb

## 2016-06-09 DIAGNOSIS — Z0000 Encounter for general adult medical examination without abnormal findings: Secondary | ICD-10-CM

## 2016-06-09 DIAGNOSIS — Z205 Contact with and (suspected) exposure to viral hepatitis: Secondary | ICD-10-CM

## 2016-06-09 DIAGNOSIS — Z01419 Encounter for gynecological examination (general) (routine) without abnormal findings: Secondary | ICD-10-CM

## 2016-06-09 LAB — LIPID PANEL
CHOL/HDL RATIO: 6 ratio — AB (ref <5.0)
CHOLESTEROL: 216 mg/dL — AB (ref <200)
HDL: 36 mg/dL — ABNORMAL LOW (ref >50)
LDL Cholesterol: 139 mg/dL — ABNORMAL HIGH (ref <100)
Triglycerides: 206 mg/dL — ABNORMAL HIGH (ref <150)
VLDL: 41 mg/dL — AB (ref <30)

## 2016-06-09 LAB — HEPATITIS C ANTIBODY: HCV Ab: NEGATIVE

## 2016-06-09 LAB — TSH: TSH: 0.92 m[IU]/L

## 2016-06-09 MED ORDER — BUPROPION HCL ER (XL) 150 MG PO TB24: 150.0000 mg | ORAL_TABLET | Freq: Every day | ORAL | 12 refills | Status: DC

## 2016-06-09 MED ORDER — NITROFURANTOIN MONOHYD MACRO 100 MG PO CAPS: 100.0000 mg | ORAL_CAPSULE | Freq: Two times a day (BID) | ORAL | 0 refills | Status: DC

## 2016-06-10 LAB — VITAMIN D 25 HYDROXY (VIT D DEFICIENCY, FRACTURES): VIT D 25 HYDROXY: 27 ng/mL — AB (ref 30–100)

## 2016-06-15 ENCOUNTER — Ambulatory Visit
Admission: RE | Admit: 2016-06-15 | Discharge: 2016-06-15 | Disposition: A | Discharge status: Home / Self Care | Payer: 59 | Source: Ambulatory Visit | Attending: Obstetrics & Gynecology | Admitting: Obstetrics & Gynecology

## 2016-06-15 ENCOUNTER — Telehealth: Payer: Self-pay | Admitting: *Deleted

## 2016-06-15 DIAGNOSIS — Z1231 Encounter for screening mammogram for malignant neoplasm of breast: Secondary | ICD-10-CM

## 2016-06-15 NOTE — Telephone Encounter (Signed)
Call to patient. Results reviewed with patient and she verbalized understanding. Will follow up with PCP in regards to elevated lipid panel. Patient requests copy of labs be mailed to her. Verbal release of records completed and taken to [facility] to process.   Routing to provider for final review. Patient agreeable to disposition. Will close encounter.

## 2016-07-10 ENCOUNTER — Encounter: Payer: Self-pay | Admitting: Obstetrics & Gynecology

## 2016-07-10 ENCOUNTER — Ambulatory Visit (INDEPENDENT_AMBULATORY_CARE_PROVIDER_SITE_OTHER): Payer: Managed Care, Other (non HMO) | Admitting: Obstetrics & Gynecology

## 2016-07-10 VITALS — BP 118/60 | HR 80 | Resp 16 | Ht 62.5 in | Wt 208.0 lb

## 2016-07-10 DIAGNOSIS — F3289 Other specified depressive episodes: Secondary | ICD-10-CM | POA: Diagnosis not present

## 2016-07-10 NOTE — Progress Notes (Signed)
GYNECOLOGY  VISIT   HPI: 66 y.o. G1P0 Married [place] American female here for follow up after starting Wellbutrin.  She is taking 150mg  XL.  She states that she thinks this has helped body pain and mood is much better.  Hot flashes are better.  Mood swings are better.  Sleep is better because she is not having hot flashes at night.  Overall, she thinks this has helped.  Does not want to change dosage.    Pt reports she wants to talk about weight gain.  She is watching her diet more closely and she has started to walk because her hips are better.    GYNECOLOGIC HISTORY: Patient's last menstrual period was 03/17/1983. Contraception:  Hysterectomy  Menopausal hormone therapy: none  Patient Active Problem List   Diagnosis Date Noted  . Abdominal pain 12/29/2013  . Menopausal hot flushes 08/17/2013  . Neck pain 08/03/2013  . Depression 01/24/2013  . GERD (gastroesophageal reflux disease) 01/24/2013  . Routine general medical examination at a health care facility 06/22/2012  . Other malaise and fatigue 05/10/2012  . Hot flashes 05/10/2012  . Unspecified constipation 05/10/2012  . Hypertension   . Hyperlipidemia   . Breast cancer ([place]) 06/08/2011    Past Medical History:  Diagnosis Date  . Breast cancer ([place]) 2008   left, with radiation finished 08, tamoxifen  . Depression 01/24/2013  . GERD (gastroesophageal reflux disease)   . Hyperlipidemia   . Hypertension   . IBS (irritable bowel syndrome)   . Palpitations   . Vertigo     Past Surgical History:  Procedure Laterality Date  . ABDOMINAL HYSTERECTOMY  1985   TAH (ovaries remain)  . BREAST LUMPECTOMY Left 2007  . TONSILLECTOMY     age 76    MEDS:  Reviewed in EPIC and UTD  ALLERGIES: Crab [shellfish allergy]; Sulfa antibiotics; Lexapro [escitalopram oxalate]; Lipitor [atorvastatin]; and Sulfa drugs cross reactors  Family History  Problem Relation Age of Onset  . Lung cancer Father     Died at 60 from lung cancer  .  Heart disease Father     heart attack  . Colon cancer Mother 38  . Hypertension Mother   . Kidney disease Mother   . Diabetes Mother   . Diabetes Brother   . Breast cancer Maternal Aunt     maternal aunt and maternal 1st cousin  . Coronary artery disease Brother 76    died of "massive heart attack"  . Hypertension Sister   . Colon cancer Cousin     maternal 1st cousin  . Diabetes Sister     SH:  Married, non smoker  Review of Systems  All other systems reviewed and are negative.   PHYSICAL EXAMINATION:    BP 118/60 (BP Location: Left Arm, Patient Position: Sitting, Cuff Size: Large)   Pulse 80   Resp 16   Ht 5' 2.5" (1.588 m)   Wt 208 lb (94.3 kg)   LMP 03/17/1983   BMI 37.44 kg/m     General appearance: alert, cooperative and appears stated age CV:  Regular rate and rhythm Lungs:  clear to auscultation, no wheezes, rales or rhonchi, symmetric air entry Psych:    Chaperone was present for exam.  Assessment: Depression with fatigue that has significantly improved  Plan: Continue Wellbutrin XL 150mg  daily.  Rx already sent to the pharmacy for the year.   ~15 minutes spent with patient >50% of time was in face to face discussion of above.

## 2016-07-23 DIAGNOSIS — E785 Hyperlipidemia, unspecified: Secondary | ICD-10-CM | POA: Insufficient documentation

## 2016-08-11 DIAGNOSIS — M6701 Short Achilles tendon (acquired), right ankle: Secondary | ICD-10-CM | POA: Insufficient documentation

## 2016-08-11 DIAGNOSIS — M898X7 Other specified disorders of bone, ankle and foot: Secondary | ICD-10-CM | POA: Insufficient documentation

## 2016-08-11 DIAGNOSIS — M2011 Hallux valgus (acquired), right foot: Secondary | ICD-10-CM

## 2016-08-11 HISTORY — DX: Hallux valgus (acquired), right foot: M20.11

## 2016-08-11 HISTORY — DX: Short Achilles tendon (acquired), right ankle: M67.01

## 2017-01-19 ENCOUNTER — Encounter: Payer: Self-pay | Admitting: Obstetrics & Gynecology

## 2017-03-16 HISTORY — PX: BREAST LUMPECTOMY: SHX2

## 2017-04-22 ENCOUNTER — Other Ambulatory Visit: Payer: Self-pay | Admitting: Internal Medicine

## 2017-05-11 ENCOUNTER — Other Ambulatory Visit: Payer: Self-pay | Admitting: Obstetrics & Gynecology

## 2017-05-11 DIAGNOSIS — Z1231 Encounter for screening mammogram for malignant neoplasm of breast: Secondary | ICD-10-CM

## 2017-06-15 ENCOUNTER — Encounter: Payer: Self-pay | Admitting: Internal Medicine

## 2017-06-16 ENCOUNTER — Ambulatory Visit
Admission: RE | Admit: 2017-06-16 | Discharge: 2017-06-16 | Disposition: A | Discharge status: Home / Self Care | Payer: Medicare Other | Source: Ambulatory Visit | Attending: Obstetrics & Gynecology | Admitting: Obstetrics & Gynecology

## 2017-06-16 DIAGNOSIS — Z1231 Encounter for screening mammogram for malignant neoplasm of breast: Secondary | ICD-10-CM

## 2017-06-18 ENCOUNTER — Other Ambulatory Visit: Payer: Self-pay | Admitting: Obstetrics & Gynecology

## 2017-06-18 DIAGNOSIS — R928 Other abnormal and inconclusive findings on diagnostic imaging of breast: Secondary | ICD-10-CM

## 2017-06-21 ENCOUNTER — Other Ambulatory Visit: Payer: Self-pay | Admitting: Obstetrics & Gynecology

## 2017-06-21 ENCOUNTER — Ambulatory Visit
Admission: RE | Admit: 2017-06-21 | Discharge: 2017-06-21 | Disposition: A | Discharge status: Home / Self Care | Payer: Medicare Other | Source: Ambulatory Visit | Attending: Obstetrics & Gynecology | Admitting: Obstetrics & Gynecology

## 2017-06-21 DIAGNOSIS — R928 Other abnormal and inconclusive findings on diagnostic imaging of breast: Secondary | ICD-10-CM

## 2017-06-21 DIAGNOSIS — N6310 Unspecified lump in the right breast, unspecified quadrant: Secondary | ICD-10-CM

## 2017-06-22 ENCOUNTER — Other Ambulatory Visit: Payer: Medicare Other

## 2017-06-22 ENCOUNTER — Ambulatory Visit
Admission: RE | Admit: 2017-06-22 | Discharge: 2017-06-22 | Disposition: A | Discharge status: Home / Self Care | Payer: Medicare Other | Source: Ambulatory Visit | Attending: Obstetrics & Gynecology | Admitting: Obstetrics & Gynecology

## 2017-06-22 DIAGNOSIS — N6310 Unspecified lump in the right breast, unspecified quadrant: Secondary | ICD-10-CM

## 2017-06-25 ENCOUNTER — Ambulatory Visit: Payer: Self-pay | Admitting: Surgery

## 2017-06-25 DIAGNOSIS — C50911 Malignant neoplasm of unspecified site of right female breast: Secondary | ICD-10-CM

## 2017-06-25 NOTE — H&P (Signed)
Katelyn Porter Documented: 06/25/2017 10:31 AM Location: [facility] Patient #: [phone] DOB: [birth year] Married / Language: English / Race: Black or African American Female  History of Present Illness Katelyn Moores A. Katelyn Salih MD; 06/25/2017 11:36 AM) Patient words: Patient sent at the request of Dr. Autumn Patty for a right breast mass detected on routine screening mammography. The patient is a past surgical history of left breast lumpectomy 2008 for stage I left breast cancer treated with breast conservation and antiestrogen therapy. She has had ureteral mammography. The patient denies any history of breast pain, breast mass, nipple discharge, or other complaint with either breast. She notes that her left breast smaller than right. 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               CLINICAL DATA: The patient was called back for a right breast mass  EXAM: DIGITAL DIAGNOSTIC RIGHT MAMMOGRAM WITH TOMO  ULTRASOUND RIGHT BREAST  COMPARISON: Previous exam(s).  ACR Breast Density Category b: There are scattered areas of fibroglandular density.  FINDINGS: The irregular mass in the lateral central right breast persists on additional imaging.  On physical exam, no suspicious lumps are identified.  Targeted ultrasound is performed, showing an irregular mass at 9 o'clock, 4 cm from the nipple measuring 8 x 6 x 7 mm, correlating with the mammographic finding. No axillary adenopathy.  IMPRESSION: Suspicious new mass in the right breast.  RECOMMENDATION: Ultrasound-guided biopsy of the new right breast mass.  I have discussed the findings and recommendations with the patient. Results were also provided in writing at the conclusion of the visit. If applicable, a reminder letter will be sent to the patient regarding the next appointment.  BI-RADS CATEGORY 4:  Suspicious.   Electronically Signed By: [name] On: 06/21/2017 10:51                          ADDITIONAL INFORMATION: PROGNOSTIC INDICATORS Results: IMMUNOHISTOCHEMICAL AND MORPHOMETRIC ANALYSIS PERFORMED MANUALLY Estrogen Receptor: 100%, POSITIVE, STRONG STAINING INTENSITY Progesterone Receptor: 5%, POSITIVE, STRONG STAINING INTENSITY Proliferation Marker Ki67: 20% REFERENCE RANGE ESTROGEN RECEPTOR NEGATIVE 0% POSITIVE =>1% REFERENCE RANGE PROGESTERONE RECEPTOR NEGATIVE 0% POSITIVE =>1% All controls stained appropriately [name] Pathologist, Electronic Signature ( Signed 06/24/2017) FINAL DIAGNOSIS Diagnosis Breast, right, needle core biopsy, 9:15 o'clock, UOQ - INVASIVE DUCTAL CARCINOMA WITH EXTRACELLULAR MUCIN, SEE COMMENT. Microscopic Comment The carcinoma appears grade 2. Prognostic markers will be ordered. [name]. The case was called to [facility] on 06/23/2017. 1 of 2 FINAL for Katelyn Porter, Katelyn Porter ([id number]) [name] Pathologist, Electronic Signature (Case signed 06/23/2017) Specimen.  The patient is a 67 year old female.   Allergies ([name], [name]; 06/25/2017 10:33 AM) Sulfa Antibiotics Allergies Reconciled  Medication History ([name], [place]; 06/25/2017 10:33 AM) DULoxetine HCl (60MG Capsule DR Part, Oral) Active. Pravastatin Sodium (40MG Tablet, Oral) Active. Triamterene-HCTZ (37.5-25MG Capsule, Oral) Active. Medications Reconciled     Review of Systems ([name]; 06/25/2017 11:38 AM) All other systems negative  Vitals (Tanisha A. Brown RMA; 06/25/2017 10:32 AM) 06/25/2017 10:32 AM Weight: 209.8 lb Height: 64in Body Surface Area: 2 m Body Mass Index: 36.01 kg/m  Temp.: 98.55F  Pulse: 92 (Regular)  BP: 132/88 (Sitting, Left Arm, Standard)      Physical Exam ([name]; 06/25/2017  11:37 AM)  General Mental Status-Alert. General Appearance-Consistent with stated age. Hydration-Well hydrated. Voice-Normal.  Head and Neck Head-normocephalic, atraumatic with no lesions or palpable masses. Trachea-midline. Thyroid Gland Characteristics - normal size and consistency.  Eye Eyeball - Bilateral-Extraocular movements intact. Sclera/Conjunctiva - Bilateral-No scleral icterus.  Chest and Lung Exam Chest and lung exam reveals -quiet, even and easy respiratory effort with no use of accessory muscles and on auscultation, normal breath sounds, no adventitious sounds and normal vocal resonance. Inspection Chest Wall - Normal. Back - normal.  Breast Note: Left breast shows postsurgical changes from previous lumpectomy. There is mild volume loss from radiation and surgery. Right breast is no mass. There is no bruising from biopsy. Nipples normal.  Cardiovascular Cardiovascular examination reveals -normal heart sounds, regular rate and rhythm with no murmurs and normal pedal pulses bilaterally.  Neurologic Neurologic evaluation reveals -alert and oriented x 3 with no impairment of recent or remote memory. Mental Status-Normal.  Musculoskeletal Normal Exam - Left-Upper Extremity Strength Normal and Lower Extremity Strength Normal. Normal Exam - Right-Upper Extremity Strength Normal and Lower Extremity Strength Normal.  Lymphatic Head & Neck  General Head & Neck Lymphatics: Bilateral - Description - Normal. Axillary  General Axillary Region: Bilateral - Description - Normal. Tenderness - Non Tender.    Assessment & Plan ([name]; 06/25/2017 11:37 AM)  BREAST CANCER, RIGHT (C50.911) Impression: upper outer quadrant  Patient desires breast conservation with lumpectomy and sentinel lymph node mapping.  Further medical oncology  Referring radiation oncology  Refer to genetics  She would like lumpectomy at this point  time irrespective of genetic outcome. Risk of lumpectomy include bleeding, infection, seroma, more surgery, use of seed/wire, wound care, cosmetic deformity and the need for other treatments, death , blood clots, death. Pt agrees to proceed. Risk of sentinel lymph node mapping include bleeding, infection, lymphedema, shoulder pain. stiffness, dye allergy. cosmetic deformity , blood clots, death, need for more surgery. Pt agres to proceed.  Current Plans You are being scheduled for surgery- Our schedulers will call you.  You should hear from our office's scheduling department within 5 working days about the location, date, and time of surgery. We try to make accommodations for patient's preferences in scheduling surgery, but sometimes the OR schedule or the surgeon's schedule prevents [place] from making those accommodations.  If you have not heard from our office [phone]) in 5 working days, call the office and ask for your surgeon's nurse.  If you have other questions about your diagnosis, plan, or surgery, call the office and ask for your surgeon's nurse.  Pt Education - [facility] Breast Cancer Information Given - [facility] "Breast Journey" Package Pt Education - Pamphlet Given - Breast Biopsy: discussed with patient and provided information. We discussed the staging and pathophysiology of breast cancer. We discussed all of the different options for treatment for breast cancer including surgery, chemotherapy, radiation therapy, Herceptin, and antiestrogen therapy. We discussed a sentinel lymph node biopsy as she does not appear to having lymph node involvement right now. We discussed the performance of that with injection of radioactive tracer and blue dye. We discussed that she would have an incision underneath her axillary hairline. We discussed that there is a bout a 10-20% chance of having a positive node with a sentinel lymph node biopsy and we will await the permanent pathology to make any other  first further decisions in terms of her treatment. One of these options might be to return  to the operating room to perform an axillary lymph node dissection. We discussed about a 1-2% risk lifetime of chronic shoulder pain as well as lymphedema associated with a sentinel lymph node biopsy. We discussed the options for treatment of the breast cancer which included lumpectomy versus a mastectomy. We discussed the performance of the lumpectomy with a wire placement. We discussed a 10-20% chance of a positive margin requiring reexcision in the operating room. We also discussed that she may need radiation therapy or antiestrogen therapy or both if she undergoes lumpectomy. We discussed the mastectomy and the postoperative care for that as well. We discussed that there is no difference in her survival whether she undergoes lumpectomy with radiation therapy or antiestrogen therapy versus a mastectomy. There is a slight difference in the local recurrence rate being 3-5% with lumpectomy and about 1% with a mastectomy. We discussed the risks of operation including bleeding, infection, possible reoperation. She understands her further therapy will be based on what her stages at the time of her operation.  Pt Education - [facility] breast cancer surgery: discussed with patient and provided information. Pt Education - [facility] Breast Biopsy HCI: discussed with patient and provided information.

## 2017-06-25 NOTE — H&P (View-Only) (Signed)
[name] Documented: 06/25/2017 10:31 AM Location: [facility] Patient #: [phone] DOB: Oct 22, 1950 Married / Language: English / Race: Black or African American Female  History of Present Illness Marcello Moores A. Relda Agosto MD; 06/25/2017 11:36 AM) Patient words: Patient sent at the request of Dr. Autumn Patty for a right breast mass detected on routine screening mammography. The patient is a past surgical history of left breast lumpectomy 2008 for stage I left breast cancer treated with breast conservation and antiestrogen therapy. She has had ureteral mammography. The patient denies any history of breast pain, breast mass, nipple discharge, or other complaint with either breast. She notes that her left breast smaller than right. 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               CLINICAL DATA: The patient was called back for a right breast mass  EXAM: DIGITAL DIAGNOSTIC RIGHT MAMMOGRAM WITH TOMO  ULTRASOUND RIGHT BREAST  COMPARISON: Previous exam(s).  ACR Breast Density Category b: There are scattered areas of fibroglandular density.  FINDINGS: The irregular mass in the lateral central right breast persists on additional imaging.  On physical exam, no suspicious lumps are identified.  Targeted ultrasound is performed, showing an irregular mass at 9 o'clock, 4 cm from the nipple measuring 8 x 6 x 7 mm, correlating with the mammographic finding. No axillary adenopathy.  IMPRESSION: Suspicious new mass in the right breast.  RECOMMENDATION: Ultrasound-guided biopsy of the new right breast mass.  I have discussed the findings and recommendations with the patient. Results were also provided in writing at the conclusion of the visit. If applicable, a reminder letter will be sent to the patient regarding the next appointment.  BI-RADS CATEGORY 4:  Suspicious.   Electronically Signed By: [name] On: 06/21/2017 10:51                          ADDITIONAL INFORMATION: PROGNOSTIC INDICATORS Results: IMMUNOHISTOCHEMICAL AND MORPHOMETRIC ANALYSIS PERFORMED MANUALLY Estrogen Receptor: 100%, POSITIVE, STRONG STAINING INTENSITY Progesterone Receptor: 5%, POSITIVE, STRONG STAINING INTENSITY Proliferation Marker Ki67: 20% REFERENCE RANGE ESTROGEN RECEPTOR NEGATIVE 0% POSITIVE =>1% REFERENCE RANGE PROGESTERONE RECEPTOR NEGATIVE 0% POSITIVE =>1% All controls stained appropriately [name] Pathologist, Electronic Signature ( Signed 06/24/2017) FINAL DIAGNOSIS Diagnosis Breast, right, needle core biopsy, 9:15 o'clock, UOQ - INVASIVE DUCTAL CARCINOMA WITH EXTRACELLULAR MUCIN, SEE COMMENT. Microscopic Comment The carcinoma appears grade 2. Prognostic markers will be ordered. [name]. The case was called to [facility] on 06/23/2017. 1 of 2 FINAL for CHARLIE, CHAR DEGRAFFENREID ([id number]) [name] Pathologist, Electronic Signature (Case signed 06/23/2017) Specimen.  The patient is a 67 year old female.   Allergies ([name], [place]; 06/25/2017 10:33 AM) Sulfa Antibiotics Allergies Reconciled  Medication History ([name], [place]; 06/25/2017 10:33 AM) DULoxetine HCl (60MG Capsule DR Part, Oral) Active. Pravastatin Sodium (40MG Tablet, Oral) Active. Triamterene-HCTZ (37.5-25MG Capsule, Oral) Active. Medications Reconciled     Review of Systems (Angle Karel A. Ava Deguire MD; 06/25/2017 11:38 AM) All other systems negative  Vitals (Tanisha A. Brown RMA; 06/25/2017 10:32 AM) 06/25/2017 10:32 AM Weight: 209.8 lb Height: 64in Body Surface Area: 2 m Body Mass Index: 36.01 kg/m  Temp.: 98.59F  Pulse: 92 (Regular)  BP: 132/88 (Sitting, Left Arm, Standard)      Physical Exam (Braxley Balandran A.  Aizley Stenseth MD; 06/25/2017  11:37 AM)  General Mental Status-Alert. General Appearance-Consistent with stated age. Hydration-Well hydrated. Voice-Normal.  Head and Neck Head-normocephalic, atraumatic with no lesions or palpable masses. Trachea-midline. Thyroid Gland Characteristics - normal size and consistency.  Eye Eyeball - Bilateral-Extraocular movements intact. Sclera/Conjunctiva - Bilateral-No scleral icterus.  Chest and Lung Exam Chest and lung exam reveals -quiet, even and easy respiratory effort with no use of accessory muscles and on auscultation, normal breath sounds, no adventitious sounds and normal vocal resonance. Inspection Chest Wall - Normal. Back - normal.  Breast Note: Left breast shows postsurgical changes from previous lumpectomy. There is mild volume loss from radiation and surgery. Right breast is no mass. There is no bruising from biopsy. Nipples normal.  Cardiovascular Cardiovascular examination reveals -normal heart sounds, regular rate and rhythm with no murmurs and normal pedal pulses bilaterally.  Neurologic Neurologic evaluation reveals -alert and oriented x 3 with no impairment of recent or remote memory. Mental Status-Normal.  Musculoskeletal Normal Exam - Left-Upper Extremity Strength Normal and Lower Extremity Strength Normal. Normal Exam - Right-Upper Extremity Strength Normal and Lower Extremity Strength Normal.  Lymphatic Head & Neck  General Head & Neck Lymphatics: Bilateral - Description - Normal. Axillary  General Axillary Region: Bilateral - Description - Normal. Tenderness - Non Tender.    Assessment & Plan (Michael Ventresca A. Zyren Sevigny MD; 06/25/2017 11:37 AM)  BREAST CANCER, RIGHT (C50.911) Impression: upper outer quadrant  Patient desires breast conservation with lumpectomy and sentinel lymph node mapping.  Further medical oncology  Referring radiation oncology  Refer to genetics  She would like lumpectomy at this point  time irrespective of genetic outcome. Risk of lumpectomy include bleeding, infection, seroma, more surgery, use of seed/wire, wound care, cosmetic deformity and the need for other treatments, death , blood clots, death. Pt agrees to proceed. Risk of sentinel lymph node mapping include bleeding, infection, lymphedema, shoulder pain. stiffness, dye allergy. cosmetic deformity , blood clots, death, need for more surgery. Pt agres to proceed.  Current Plans You are being scheduled for surgery- Our schedulers will call you.  You should hear from our office's scheduling department within 5 working days about the location, date, and time of surgery. We try to make accommodations for patient's preferences in scheduling surgery, but sometimes the OR schedule or the surgeon's schedule prevents [place] from making those accommodations.  If you have not heard from our office [phone]) in 5 working days, call the office and ask for your surgeon's nurse.  If you have other questions about your diagnosis, plan, or surgery, call the office and ask for your surgeon's nurse.  Pt Education - [facility] Breast Cancer Information Given - [facility] "Breast Journey" Package Pt Education - Pamphlet Given - Breast Biopsy: discussed with patient and provided information. We discussed the staging and pathophysiology of breast cancer. We discussed all of the different options for treatment for breast cancer including surgery, chemotherapy, radiation therapy, Herceptin, and antiestrogen therapy. We discussed a sentinel lymph node biopsy as she does not appear to having lymph node involvement right now. We discussed the performance of that with injection of radioactive tracer and blue dye. We discussed that she would have an incision underneath her axillary hairline. We discussed that there is a bout a 10-20% chance of having a positive node with a sentinel lymph node biopsy and we will await the permanent pathology to make any other  first further decisions in terms of her treatment. One of these options might be to return  to the operating room to perform an axillary lymph node dissection. We discussed about a 1-2% risk lifetime of chronic shoulder pain as well as lymphedema associated with a sentinel lymph node biopsy. We discussed the options for treatment of the breast cancer which included lumpectomy versus a mastectomy. We discussed the performance of the lumpectomy with a wire placement. We discussed a 10-20% chance of a positive margin requiring reexcision in the operating room. We also discussed that she may need radiation therapy or antiestrogen therapy or both if she undergoes lumpectomy. We discussed the mastectomy and the postoperative care for that as well. We discussed that there is no difference in her survival whether she undergoes lumpectomy with radiation therapy or antiestrogen therapy versus a mastectomy. There is a slight difference in the local recurrence rate being 3-5% with lumpectomy and about 1% with a mastectomy. We discussed the risks of operation including bleeding, infection, possible reoperation. She understands her further therapy will be based on what her stages at the time of her operation.  Pt Education - [facility]b breast cancer surgery: discussed with patient and provided information. Pt Education - [facility] Breast Biopsy HCI: discussed with patient and provided information.

## 2017-06-28 ENCOUNTER — Telehealth: Payer: Self-pay | Admitting: Oncology

## 2017-06-28 NOTE — Progress Notes (Signed)
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  She was treated with surgery radiation and tamoxifen for 5 years and we released her from follow-up in 2013.  More recently she had routine screening mammography, on 06/16/2017 showing a possible abnormality in the right breast. She underwent unilateral right breast diagnostic mammography with tomography and right breast ultrasonography at [facility] on 06/21/2017 showing breast density category B. Suspicious new mass in the right breast 9 o'clock central and 4 cm from the nipple measuring 0.8 x 0.6 x 0.7 cm. There was no axillary adenopathy sonographically.   Accordingly on 06/22/2017 she proceeded to biopsy of the right breast area in question. The pathology from this procedure showed ([id number]): Breast, right, needle core biopsy, 9:15 o'clock, UOQ with invasive ductal carcinoma with extracellular mucin, grade 2. Prognostic indicators significant for: estrogen receptor, 100% positive and progesterone receptor, 5% positive, both with strong staining intensity. Proliferation marker Ki67 at 20%. HER2 negative.  The patient's subsequent history is as detailed below.  INTERVAL HISTORY: Katelyn Porter was evaluated in the breast cancer clinic on 06/29/2017.  She has also been scheduled for genetics testing 06/30/2017.  REVIEW OF SYSTEMS: Katelyn Porter reports that she  went in for routine mammography, and she never felt a mass in her breast. There were no specific symptoms leading to the original mammogram, which was routinely scheduled. Katelyn Porter walks and rides horses for exercise. She owns 2 tennesse walking horses. The patient has some occasional headaches, but this is not concerning for her. The patient denies unusual visual changes, nausea, vomiting, stiff neck, dizziness, or gait imbalance. There has been no cough, phlegm production, or pleurisy, no chest pain or pressure, and no change in bowel or bladder habits. The patient denies fever, rash, bleeding, unexplained fatigue or unexplained weight loss. A detailed review of systems was otherwise entirely negative.   PAST MEDICAL HISTORY: Past Medical History:  Diagnosis Date  . Breast cancer ([place]) 2008   left, with radiation finished 08, tamoxifen  . Depression 01/24/2013  . GERD (gastroesophageal reflux disease)   . Hyperlipidemia   . Hypertension   . IBS (irritable bowel syndrome)   . Palpitations   . Vertigo     PAST SURGICAL HISTORY: Past Surgical History:  Procedure Laterality Date  . ABDOMINAL HYSTERECTOMY  1985   TAH (ovaries remain)  . BREAST LUMPECTOMY Left 2007  . TONSILLECTOMY     age 67    FAMILY HISTORY Family History  Problem Relation Age of Onset  . Lung cancer Father        Died at 20 from lung cancer  . Heart disease Father        heart attack  . Colon cancer Mother 30  . Hypertension Mother   . Kidney disease Mother   . Diabetes Mother   . Diabetes Brother   . Breast cancer Maternal Aunt        maternal aunt and maternal 1st cousin  .  Coronary artery disease Brother 89       died of "massive heart attack"  . Hypertension Sister   . Colon cancer Cousin        maternal 1st cousin  . Diabetes Sister   The patient's father had a history of tongue cancer which apparently spread to his lungs according to the patient. He passed due to a MI at age 32. The patient's mother  died at age 60 due to renal failure and diabetes.  She also had a history of colon cancer. The patient had 1 brother who died from an MI; and 1 sister.  There was a maternal aunt diagnosed with breast cancer in the 60's. A maternal cousin passed due to breast cancer. Another maternal cousin had colon cancer. She denies a family history of ovarian cancer.   GYNECOLOGIC HISTORY:  Patient's last menstrual period was 03/17/1983. Menarche: 67 years old. She is GXP0. Her LMP was age 67 after her hysterectomy with BSO. She took HRT until 2008, discontinued at the time of her initial breast cancer diagnosis.    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 Her husband, Marjory Lies, worked in [employer] but is retired. He has 2 children from a prior marriage, a daughter Zigmund Daniel, who lives in [place] and works for [employer], and a son, Personnel officer, who works for the [employer] in [place], [place]. The patient has 3 step-grandchildren. She attends [employer].     ADVANCED DIRECTIVES:    HEALTH MAINTENANCE: Social History   Tobacco Use  . Smoking status: Never Smoker  . Smokeless tobacco: Never Used  Substance Use Topics  . Alcohol use: No  . Drug use: No     Colonoscopy: UTD/Dr. Perry/ normal  PAP: s/p TAHBSO  Bone density:   Allergies  Allergen Reactions  . Crab [Shellfish Allergy] Swelling    Blisters on tongue.  . Sulfa Antibiotics Swelling  . Lexapro [Escitalopram Oxalate] Other (See Comments)    drowsiness  . Lipitor [Atorvastatin]     Muscle cramping  . Sulfa Drugs Cross Reactors     Current Outpatient Medications  Medication Sig Dispense Refill  . buPROPion (WELLBUTRIN XL) 150 MG 24 hr tablet Take 1 tablet (150 mg total) by mouth daily. 30 tablet 12  . Cholecalciferol (VITAMIN D3) 1000 UNITS CAPS Take 1 capsule by mouth daily.     . Multiple Vitamins-Minerals (ONE-A-DAY WOMENS 50 PLUS PO) Take 1 tablet by mouth daily.      . nitrofurantoin, macrocrystal-monohydrate, (MACROBID) 100 MG capsule Take 1 capsule (100 mg total) by mouth 2 (two) times daily. Take for 3 days 30 capsule 0  . pantoprazole (PROTONIX) 40 MG tablet TAKE ONE TABLET BY MOUTH ONCE DAILY 90 tablet 0  . pravastatin (PRAVACHOL) 40 MG tablet Take 1 tablet (40 mg total) by mouth daily. 30 tablet 5  . triamterene-hydrochlorothiazide (DYAZIDE) 37.5-25 MG capsule Take by mouth.     No current facility-administered medications for this visit.     OBJECTIVE: Middle-aged African-American woman who appears well  Vitals:   06/29/17 1530  BP: (!) 118/56  Pulse: 82  Resp: 18  Temp: 98.6 F (37 C)  SpO2: 99%     Body mass index is 37.08 kg/m.   Wt Readings from Last 3 Encounters:  06/29/17 206 lb (93.4 kg)  07/10/16 208 lb (94.3 kg)  06/09/16 212 lb (96.2 kg)      ECOG FS:0 - Asymptomatic  Ocular: Sclerae unicteric, pupils round and equal Lymphatic: No cervical or supraclavicular adenopathy Lungs no rales or rhonchi Heart regular rate and rhythm Abd soft, nontender, positive bowel sounds MSK no focal spinal tenderness, no joint edema Neuro: non-focal, well-oriented, appropriate affect Breasts: The right breast is status post recent biopsy.  There are no skin or nipple changes of concern.  I do not palpate a mass.  The left breast is status post remote lumpectomy and radiation.  The cosmetic result is excellent.  Both axillae are benign.   LAB RESULTS:  CMP     Component Value Date/Time   NA 141 02/24/2016 1906   K 3.7 02/24/2016 1906   CL 108 02/24/2016 1906   CO2 26 02/24/2016 1906   GLUCOSE 99 02/24/2016 1906   BUN 16 02/24/2016 1906   CREATININE 0.90 02/24/2016 1906   CREATININE 0.94 04/13/2013 1411   CALCIUM 9.0 02/24/2016 1906   PROT 6.8 02/24/2016 1906   ALBUMIN 4.0 02/24/2016 1906   AST 17 02/24/2016 1906   ALT 17 02/24/2016 1906   ALKPHOS 74 02/24/2016 1906   BILITOT 0.4 02/24/2016 1906   GFRNONAA >60 02/24/2016 1906    GFRNONAA 68 01/24/2013 0942   GFRAA >60 02/24/2016 1906   GFRAA 79 01/24/2013 0942    No results found for: TOTALPROTELP, ALBUMINELP, A1GS, A2GS, BETS, BETA2SER, GAMS, MSPIKE, SPEI  No results found for: KPAFRELGTCHN, LAMBDASER, KAPLAMBRATIO  Lab Results  Component Value Date   WBC 6.5 06/29/2017   NEUTROABS 3.2 06/29/2017   HGB 12.7 02/24/2016   HCT 43.6 06/29/2017   MCV 89.3 06/29/2017   PLT 296 06/29/2017    '@LASTCHEMISTRY' @  Lab Results  Component Value Date   LABCA2 30 07/28/2010    No components found for: [id number]  No results for input(s): INR in the last 168 hours.  Lab Results  Component Value Date   LABCA2 30 07/28/2010    No results found for: MOL078  No results found for: MLJ449  No results found for: EEF007  No results found for: CA2729  No components found for: HGQUANT  No results found for: CEA1 / No results found for: CEA1   No results found for: AFPTUMOR  No results found for: [place]  No results found for: PSA1  Appointment on 06/29/2017  Component Date Value Ref Range Status  . WBC Count 06/29/2017 6.5  3.9 - 10.3 K/uL Final  . RBC 06/29/2017 4.89  3.70 - 5.45 MIL/uL Final  . Hemoglobin 06/29/2017 14.7  11.6 - 15.9 g/dL Final  . HCT 06/29/2017 43.6  34.8 - 46.6 % Final  . MCV 06/29/2017 89.3  79.5 - 101.0 fL Final  . MCH 06/29/2017 30.0  25.1 - 34.0 pg Final  . MCHC 06/29/2017 33.6  31.5 - 36.0 g/dL Final  . RDW 06/29/2017 12.8  11.2 - 14.5 % Final  . Platelet Count 06/29/2017 296  145 - 400 K/uL Final  . Neutrophils Relative % 06/29/2017 50  % Final  . Neutro Abs 06/29/2017 3.2  1.5 - 6.5 K/uL Final  . Lymphocytes Relative 06/29/2017 39  % Final  . Lymphs Abs 06/29/2017 2.6  0.9 - 3.3 K/uL Final  . Monocytes Relative 06/29/2017 8  % Final  . Monocytes Absolute 06/29/2017 0.5  0.1 - 0.9 K/uL Final  . Eosinophils Relative 06/29/2017 2  % Final  . Eosinophils Absolute 06/29/2017 0.1  0.0 - 0.5 K/uL Final  . Basophils  Relative 06/29/2017 1  % Final  . Basophils Absolute  06/29/2017 0.1  0.0 - 0.1 K/uL Final   Performed at [facility], [address] [place], [place] [address]    (this displays the last labs from the last 3 days)  No results found for: TOTALPROTELP, ALBUMINELP, A1GS, A2GS, BETS, BETA2SER, GAMS, MSPIKE, SPEI (this displays SPEP labs)  No results found for: KPAFRELGTCHN, LAMBDASER, KAPLAMBRATIO (kappa/lambda light chains)  No results found for: HGBA, HGBA2QUANT, HGBFQUANT, HGBSQUAN (Hemoglobinopathy evaluation)   No results found for: LDH  No results found for: IRON, TIBC, IRONPCTSAT (Iron and TIBC)  No results found for: FERRITIN  Urinalysis    Component Value Date/Time   COLORURINE YELLOW 02/24/2016 1906   APPEARANCEUR CLEAR 02/24/2016 1906   LABSPEC 1.021 02/24/2016 1906   PHURINE 6.0 02/24/2016 1906   GLUCOSEU NEGATIVE 02/24/2016 1906   HGBUR NEGATIVE 02/24/2016 1906   BILIRUBINUR NEGATIVE 02/24/2016 1906   BILIRUBINUR n 04/13/2013 1318   [place] 02/24/2016 1906   PROTEINUR NEGATIVE 02/24/2016 1906   UROBILINOGEN negative 04/13/2013 1318   NITRITE NEGATIVE 02/24/2016 1906   LEUKOCYTESUR NEGATIVE 02/24/2016 1906     STUDIES: US Breast Ltd Uni Right Inc Axilla  Result Date: 06/21/2017 CLINICAL DATA:  The patient was called back for a right breast mass EXAM: DIGITAL DIAGNOSTIC RIGHT MAMMOGRAM WITH TOMO ULTRASOUND RIGHT BREAST COMPARISON:  Previous exam(s). ACR Breast Density Category b: There are scattered areas of fibroglandular density. FINDINGS: The irregular mass in the lateral central right breast persists on additional imaging. On physical exam, no suspicious lumps are identified. Targeted ultrasound is performed, showing an irregular mass at 9 o'clock, 4 cm from the nipple measuring 8 x 6 x 7 mm, correlating with the mammographic finding. No axillary adenopathy. IMPRESSION: Suspicious new mass in the right breast. RECOMMENDATION:  Ultrasound-guided biopsy of the new right breast mass. I have discussed the findings and recommendations with the patient. Results were also provided in writing at the conclusion of the visit. If applicable, a reminder letter will be sent to the patient regarding the next appointment. BI-RADS CATEGORY  4: Suspicious. Electronically Signed   By: [name]   On: 06/21/2017 10:51   Mm Diag Breast Tomo Uni Right  Result Date: 06/21/2017 CLINICAL DATA:  The patient was called back for a right breast mass EXAM: DIGITAL DIAGNOSTIC RIGHT MAMMOGRAM WITH TOMO ULTRASOUND RIGHT BREAST COMPARISON:  Previous exam(s). ACR Breast Density Category b: There are scattered areas of fibroglandular density. FINDINGS: The irregular mass in the lateral central right breast persists on additional imaging. On physical exam, no suspicious lumps are identified. Targeted ultrasound is performed, showing an irregular mass at 9 o'clock, 4 cm from the nipple measuring 8 x 6 x 7 mm, correlating with the mammographic finding. No axillary adenopathy. IMPRESSION: Suspicious new mass in the right breast. RECOMMENDATION: Ultrasound-guided biopsy of the new right breast mass. I have discussed the findings and recommendations with the patient. Results were also provided in writing at the conclusion of the visit. If applicable, a reminder letter will be sent to the patient regarding the next appointment. BI-RADS CATEGORY  4: Suspicious. Electronically Signed   By: [name]   On: 06/21/2017 10:51   Mm Screening Breast Tomo Bilateral  Result Date: 06/17/2017 CLINICAL DATA:  Screening. EXAM: DIGITAL SCREENING BILATERAL MAMMOGRAM WITH TOMO AND CAD COMPARISON:  Previous exam(s). ACR Breast Density Category b: There are scattered areas of fibroglandular density. FINDINGS: In the right breast, a possible mass warrants further evaluation. In  the left breast, no findings suspicious for malignancy. Images were processed with CAD.  IMPRESSION: Further evaluation is suggested for possible mass in the right breast. RECOMMENDATION: Diagnostic mammogram and possibly ultrasound of the right breast. (Code:[id number]) The patient will be contacted regarding the findings, and additional imaging will be scheduled. BI-RADS CATEGORY  0: Incomplete. Need additional imaging evaluation and/or prior mammograms for comparison. Electronically Signed   By: Marin Olp M.D.   On: 06/17/2017 11:53   Mm Clip Placement Right  Result Date: 06/22/2017 CLINICAL DATA:  Status post right breast ultrasound-guided biopsy. EXAM: DIAGNOSTIC RIGHT MAMMOGRAM POST ULTRASOUND BIOPSY COMPARISON:  Previous exam(s). FINDINGS: Mammographic images were obtained following ultrasound guided biopsy of a small lateral right breast mass. The ribbon shaped biopsy clip lies the anterior margin of the mass. IMPRESSION: Well-positioned ribbon shaped biopsy clip following ultrasound-guided core needle biopsy of a lateral right breast mass. Final Assessment: Post Procedure Mammograms for Marker Placement Electronically Signed   By: Lajean Manes M.D.   On: 06/22/2017 13:40   [place] Rt Breast Bx W Loc Dev 1st Lesion Img Bx Spec US Guide  Addendum Date: 06/24/2017   ADDENDUM REPORT: 06/24/2017 07:38 ADDENDUM: Pathology revealed GRADE II INVASIVE DUCTAL CARCINOMA WITH EXTRACELLULAR MUCIN of the Right breast, 9:15 o'clock, upper outer quadrant. This was found to be concordant by Dr. Lajean Manes. Pathology results were discussed with the patient by telephone. The patient reported doing well after the biopsy with tenderness at the site. Post biopsy instructions and care were reviewed and questions were answered. The patient was encouraged to call [facility] for any additional concerns. Surgical consultation has been arranged with Dr. Erroll Luna, per patient request, at [facility] on June 25, 2017. Pathology results reported by Terie Purser, RN on  06/24/2017. Electronically Signed   By: Lajean Manes M.D.   On: 06/24/2017 07:38   Result Date: 06/24/2017 CLINICAL DATA:  Patient presents for ultrasound-guided core needle biopsy of a small mass the lateral right breast, described as at 9 o'clock, projecting slightly above midline towards the 9:15 o'clock position. EXAM: ULTRASOUND GUIDED RIGHT BREAST CORE NEEDLE BIOPSY COMPARISON:  Previous exam(s). FINDINGS: I met with the patient and we discussed the procedure of ultrasound-guided biopsy, including benefits and alternatives. We discussed the high likelihood of a successful procedure. We discussed the risks of the procedure, including infection, bleeding, tissue injury, clip migration, and inadequate sampling. Informed written consent was given. The usual time-out protocol was performed immediately prior to the procedure. Lesion quadrant: Upper outer quadrant Using sterile technique and 1% Lidocaine as local anesthetic, under direct ultrasound visualization, a 12 gauge spring-loaded device was used to perform biopsy of the small irregular hypoechoic right breast mass using a inferior, lateral approach. At the conclusion of the procedure a ribbon shaped tissue marker clip was deployed into the biopsy cavity. Follow up 2 view mammogram was performed and dictated separately. IMPRESSION: Ultrasound guided biopsy of a small right breast mass. No apparent complications. Electronically Signed: By: Lajean Manes M.D. On: 06/22/2017 13:30    ELIGIBLE FOR AVAILABLE RESEARCH PROTOCOL: no  ASSESSMENT: 67 y.o. [place] , Colon woman status post left lumpectomy February 2008 for a grade 3 ductal carcinoma in situ which was strongly estrogen and progesterone receptor positive.  (a)   adjuvant radiation completed May 2008  (b)  on tamoxifen July 2008 through March 2013.  (1) status post right breast upper outer quadrant biopsy 06/22/2017 for a clinnical T1b  N0, stage IA invasive ductal carcinoma, with extracellular  mucin; grade 2; estrogen and progesterone receptor positive, HER-2 not amplified, with an MIB-1 of 20%.  (2) genetics testing 06/30/2017  (3) Oncotype requested from the 06/22/2017 biopsy  (4) definitive surgery pending  (5) adjuvant radiation to follow  (6) antiestrogens to start at the completion of local treatment   PLAN: She understands the current cancer is different from the one she had on the left.  This 1 is invasive.  It is also estrogen receptor positive however and HER-2 not amplified.  It is neither fast or slow, and it is also of intermediate grade.  This is important in terms of treatment options.  We discussed the difference between local and systemic therapy. In terms of loco-regional treatment, lumpectomy plus radiation is equivalent to mastectomy as far as survival is concerned. For this reason, and because the cosmetic results are generally superior, we recommend breast conserving surgery. .  We then discussed the rationale for systemic therapy. There is some risk that this cancer may have already spread to other parts of her body. Patients frequently ask at this point about bone scans, CAT scans and PET scans to find out if they have occult breast cancer somewhere else. The problem is that in early stage disease we are much more likely to find false positives then true cancers and this would expose the patient to unnecessary procedures as well as unnecessary radiation. Scans cannot answer the question the patient really would like to know, which is whether she has microscopic disease elsewhere in her body. For those reasons we do not recommend them.  Of course we would proceed to aggressive evaluation of any symptoms that might suggest metastatic disease, but that is not the case here.  Next we went over the options for systemic therapy which are anti-estrogens, anti-HER-2 immunotherapy, and chemotherapy. Katelyn Porter does not meet criteria for anti-HER-2 immunotherapy. She is a  good candidate for anti-estrogens.  The question of chemotherapy is more complicated. Chemotherapy is most effective in rapidly growing, aggressive tumors. It is much less effective in low-grade, slow growing cancers, like Katelyn Porter 's. For that reason we are going to request an Oncotype from the initial biopsy sample.  If that suggests benefit from chemotherapy she will have a port placed at the time of her definitive surgery  Also she qualifies for genetics testing.  In patients who carry a deleterious mutation [for example in a  BRCA gene], the risk of a new breast cancer developing in the future may be sufficiently great that the patient may choose bilateral mastectomies. However if she wishes to keep her breasts in that situation it is safe to do so. That would require intensified screening, which generally means not only yearly mammography but a yearly breast MRI as well.   If she does carry mutation she is leaning towards possibly bilateral mastectomies but would need information regarding reconstruction options.  In that case her definitive surgery might be delayed and we would consider starting her on anastrozole sooner rather than later  Tentatively I am scheduling her to see me in about 2 months.  If she does need chemotherapy I would want her to come back and discuss that before she has her surgery.  Given the fact that this is a mucinous breast cancer my suspicion is she will not get significant benefit from chemotherapy.  Katelyn Porter has a good understanding of the overall plan. She agrees with it. She knows the goal of treatment  in her case is cure. She will call with any problems that may develop before her next visit here.  Larena Ohnemus, Virgie Dad, MD  06/29/17 4:15 PM Medical Oncology and Hematology [facility] [address] [place], [place] [address] Tel. [phone]    Fax. [phone]    This document serves as a record of services personally performed by Lurline Del,  MD. It was created on his behalf by Sheron Nightingale, a trained medical scribe. The creation of this record is based on the scribe's personal observations and the provider's statements to them.   I have reviewed the above documentation for accuracy and completeness, and I agree with the above.

## 2017-06-28 NOTE — Telephone Encounter (Signed)
Appt has been scheduled for the Katelyn Porter to see Dr. [name] on 4/16 at 4pm. Katelyn Porter has seen him previous for the same dx and would prefer to stay with him. A genetic counseling appt has been scheduled for the Katelyn Porter to see Roma Kayser on 4/17 at 9am. Katelyn Porter has agreed to all appts. She is aware to arrive 30 minutes for 4/16 appt.

## 2017-06-29 ENCOUNTER — Inpatient Hospital Stay: Payer: Medicare Other | Attending: Oncology | Admitting: Oncology

## 2017-06-29 ENCOUNTER — Other Ambulatory Visit: Payer: Self-pay

## 2017-06-29 ENCOUNTER — Inpatient Hospital Stay: Payer: Medicare Other

## 2017-06-29 VITALS — BP 118/56 | HR 82 | Temp 98.6°F | Resp 18 | Ht 62.5 in | Wt 206.0 lb

## 2017-06-29 DIAGNOSIS — C50411 Malignant neoplasm of upper-outer quadrant of right female breast: Secondary | ICD-10-CM | POA: Insufficient documentation

## 2017-06-29 DIAGNOSIS — D0512 Intraductal carcinoma in situ of left breast: Secondary | ICD-10-CM

## 2017-06-29 DIAGNOSIS — Z170 Estrogen receptor positive status [ER+]: Secondary | ICD-10-CM | POA: Diagnosis not present

## 2017-06-29 DIAGNOSIS — C50911 Malignant neoplasm of unspecified site of right female breast: Secondary | ICD-10-CM

## 2017-06-29 DIAGNOSIS — C50912 Malignant neoplasm of unspecified site of left female breast: Secondary | ICD-10-CM

## 2017-06-29 HISTORY — DX: Malignant neoplasm of upper-outer quadrant of right female breast: C50.411

## 2017-06-29 HISTORY — DX: Malignant neoplasm of unspecified site of left female breast: C50.912

## 2017-06-29 LAB — CBC WITH DIFFERENTIAL (CANCER CENTER ONLY)
BASOS PCT: 1 %
Basophils Absolute: 0.1 10*3/uL (ref 0.0–0.1)
Eosinophils Absolute: 0.1 10*3/uL (ref 0.0–0.5)
Eosinophils Relative: 2 %
HEMATOCRIT: 43.6 % (ref 34.8–46.6)
HEMOGLOBIN: 14.7 g/dL (ref 11.6–15.9)
LYMPHS ABS: 2.6 10*3/uL (ref 0.9–3.3)
Lymphocytes Relative: 39 %
MCH: 30 pg (ref 25.1–34.0)
MCHC: 33.6 g/dL (ref 31.5–36.0)
MCV: 89.3 fL (ref 79.5–101.0)
MONOS PCT: 8 %
Monocytes Absolute: 0.5 10*3/uL (ref 0.1–0.9)
NEUTROS ABS: 3.2 10*3/uL (ref 1.5–6.5)
NEUTROS PCT: 50 %
Platelet Count: 296 10*3/uL (ref 145–400)
RBC: 4.89 MIL/uL (ref 3.70–5.45)
RDW: 12.8 % (ref 11.2–14.5)
WBC: 6.5 10*3/uL (ref 3.9–10.3)

## 2017-06-29 LAB — COMPREHENSIVE METABOLIC PANEL
ALBUMIN: 4.4 g/dL (ref 3.5–5.0)
ALK PHOS: 79 U/L (ref 38–126)
ALT: 23 U/L (ref 14–54)
ANION GAP: 11 (ref 5–15)
AST: 19 U/L (ref 15–41)
BILIRUBIN TOTAL: 0.7 mg/dL (ref 0.3–1.2)
BUN: 22 mg/dL — AB (ref 6–20)
CALCIUM: 10.2 mg/dL (ref 8.9–10.3)
CO2: 25 mmol/L (ref 22–32)
CREATININE: 1.04 mg/dL — AB (ref 0.44–1.00)
Chloride: 103 mmol/L (ref 101–111)
GFR calc Af Amer: >60 mL/min (ref >60)
GFR calc non Af Amer: 55 mL/min — ABNORMAL LOW (ref >60)
GLUCOSE: 108 mg/dL — AB (ref 65–99)
Potassium: 4.1 mmol/L (ref 3.5–5.1)
SODIUM: 139 mmol/L (ref 135–145)
TOTAL PROTEIN: 7.7 g/dL (ref 6.5–8.1)

## 2017-06-29 NOTE — Progress Notes (Signed)
Per Dr [name], received orders for blood work at this time. Contacted lab, pt has appt today.

## 2017-06-30 ENCOUNTER — Inpatient Hospital Stay: Payer: Medicare Other

## 2017-06-30 ENCOUNTER — Telehealth: Payer: Self-pay | Admitting: Oncology

## 2017-06-30 ENCOUNTER — Encounter: Payer: Self-pay | Admitting: Radiation Oncology

## 2017-06-30 ENCOUNTER — Inpatient Hospital Stay (HOSPITAL_BASED_OUTPATIENT_CLINIC_OR_DEPARTMENT_OTHER): Payer: Medicare Other | Admitting: Genetic Counselor

## 2017-06-30 ENCOUNTER — Telehealth: Payer: Self-pay | Admitting: *Deleted

## 2017-06-30 ENCOUNTER — Encounter: Payer: Self-pay | Admitting: Genetic Counselor

## 2017-06-30 DIAGNOSIS — C50411 Malignant neoplasm of upper-outer quadrant of right female breast: Secondary | ICD-10-CM | POA: Diagnosis present

## 2017-06-30 DIAGNOSIS — Z7183 Encounter for nonprocreative genetic counseling: Secondary | ICD-10-CM

## 2017-06-30 DIAGNOSIS — Z800 Family history of malignant neoplasm of digestive organs: Secondary | ICD-10-CM | POA: Diagnosis not present

## 2017-06-30 DIAGNOSIS — Z803 Family history of malignant neoplasm of breast: Secondary | ICD-10-CM | POA: Diagnosis not present

## 2017-06-30 DIAGNOSIS — Z170 Estrogen receptor positive status [ER+]: Principal | ICD-10-CM

## 2017-06-30 NOTE — Progress Notes (Signed)
REFERRING PROVIDER: Chauncey Cruel, MD [place], [place] [address]  PRIMARY PROVIDER:  Doug Sou B, DO  PRIMARY REASON FOR VISIT:  1. Malignant neoplasm of upper-outer quadrant of right breast in female, estrogen receptor positive ([facility])   2. Family history of breast cancer   3. Family history of colon cancer      HISTORY OF PRESENT ILLNESS:   Katelyn Porter, a 67 y.o. female, was seen for a [facility] cancer genetics consultation at the request of Dr. [name] due to a personal and family history of cancer.  Katelyn Porter presents to clinic today to discuss the possibility of a hereditary predisposition to cancer, genetic testing, and to further clarify her future cancer risks, as well as potential cancer risks for family members.   In 2008, at the age of 52, Katelyn Porter was diagnosed with invasive ductal carcinoma of the left breast. This was treated with lumpectomy, radiation and tamoxifen.  In March 2019, at the age of 50, Katelyn Porter was diagnosed with cancer in her right breast.  Both tumors were ER+/PR+/Her2-.  She will undergo lumpectomy and radiation.  Chemotherapy is TBD.  Patient states that regardless of her genetic test results, she will not have a double mastectomy.       CANCER HISTORY:   No history exists.     HORMONAL RISK FACTORS:  Menarche was at age 48.  First live birth at age N/A.  OCP use for approximately 10 years.  Ovaries intact: yes.  Hysterectomy: yes.  Menopausal status: postmenopausal.  HRT use: 10 years. Colonoscopy: yes; normal. Mammogram within the last year: yes. Number of breast biopsies: 2. Up to date with pelvic exams:  yes. Any excessive radiation exposure in the past:  no  Past Medical History:  Diagnosis Date  . Breast cancer ([facility]) 2008   left, with radiation finished 08, tamoxifen  . Depression 01/24/2013  . Family history of breast cancer   . Family history of colon cancer   . GERD (gastroesophageal reflux disease)    . Hyperlipidemia   . Hypertension   . IBS (irritable bowel syndrome)   . Palpitations   . Vertigo     Past Surgical History:  Procedure Laterality Date  . ABDOMINAL HYSTERECTOMY  1985   TAH (ovaries remain)  . BREAST LUMPECTOMY Left 2007  . TONSILLECTOMY     age 30    Social History   Socioeconomic History  . Marital status: Married    Spouse name: Not on file  . Number of children: 0  . Years of education: Not on file  . Highest education level: Not on file  Occupational History  . Occupation: accounts payable    Employer: [employer]  . Financial resource strain: Not on file  . Food insecurity:    Worry: Not on file    Inability: Not on file  . Transportation needs:    Medical: Not on file    Non-medical: Not on file  Tobacco Use  . Smoking status: Never Smoker  . Smokeless tobacco: Never Used  Substance and Sexual Activity  . Alcohol use: No  . Drug use: No  . Sexual activity: Not on file  Lifestyle  . Physical activity:    Days per week: Not on file    Minutes per session: Not on file  . Stress: Not on file  Relationships  . Social connections:    Talks on phone: Not on file  Gets together: Not on file    Attends religious service: Not on file    Active member of club or organization: Not on file    Attends meetings of clubs or organizations: Not on file    Relationship status: Not on file  Other Topics Concern  . Not on file  Social History Narrative   No children   Married   Enjoys quilting and horseback riding.    She works in Pension scheme manager.   She completed 2 years of college.      FAMILY HISTORY:  We obtained a detailed, 4-generation family history.  Significant diagnoses are listed below: Family History  Problem Relation Age of Onset  . Lung cancer Father        Died at 57 from lung cancer  . Heart disease Father        heart attack  . Colon cancer Mother 76  . Hypertension Mother   . Kidney disease  Mother   . Diabetes Mother   . Hypertension Sister   . Diabetes Sister   . Breast cancer Maternal Aunt        dx in her 21s  . Coronary artery disease Brother 80       died of "massive heart attack"  . Diabetes Brother   . Colon cancer Cousin        maternal 1st cousin dx over 63  . Breast cancer Cousin        mat 1st cousin dx in her 66s    The patient does not have children.  She has a brother and sister who are cancer free.  Her brother died of a heart attack.  Both parents are deceased.    The patient's mother had colon cancer in her late 45's.  She had 9 siblings.  One sister had breast cancer in her 26's, a second sister had a daughter with breast cancer in her 15's, and a third sister had a daughter with colon cancer.  The maternal grandparents are deceased from non cancer related issues.  The patient's father had an oral cancer that spread to his lungs.  He died at 35.  He had a brother and sister who were cancer free. His parents are both deceased from non-cancer related issues.  Katelyn Porter is unaware of previous family history of genetic testing for hereditary cancer risks. Patient's maternal ancestors are of [place] American descent, and paternal ancestors are of [place] American and Native American descent. There is no reported Ashkenazi Jewish ancestry. There is no known consanguinity.  GENETIC COUNSELING ASSESSMENT: Katelyn Porter is a 67 y.o. female with a personal and family history of cancer which is somewhat suggestive of a hereditary cancer syndrome and predisposition to cancer. We, therefore, discussed and recommended the following at today's visit.   DISCUSSION: We discussed that about 5-10% of breast cancer is hereditary with most cases due to BRCA mutations.  Other genes are also associated with hereditary breast cancer syndromes, including ATM, CHEK2 and PALB2.  We reviewed the characteristics, features and inheritance patterns of hereditary cancer syndromes. Based on the  family history, we discussed that her chance of testing positive for a BRCA mutation is low, based on the number of people in her family vs the number of people who have cancer.  However, a moderate risk gene may be more likely.  We also discussed genetic testing, including the appropriate family members to test, the process of testing, insurance coverage and turn-around-time for results.  We discussed the implications of a negative, positive and/or variant of uncertain significant result. We recommended Katelyn Porter pursue genetic testing for the common hereditary cancer gene panel. The Hereditary Gene Panel offered by [facility] includes sequencing and/or deletion duplication testing of the following 47 genes: APC, ATM, AXIN2, BARD1, BMPR1A, BRCA1, BRCA2, BRIP1, CDH1, CDK4, CDKN2A (p14ARF), CDKN2A (p16INK4a), CHEK2, CTNNA1, DICER1, EPCAM (Deletion/duplication testing only), GREM1 (promoter region deletion/duplication testing only), KIT, MEN1, MLH1, MSH2, MSH3, MSH6, MUTYH, NBN, NF1, NHTL1, PALB2, PDGFRA, PMS2, POLD1, POLE, PTEN, RAD50, RAD51C, RAD51D, SDHB, SDHC, SDHD, SMAD4, SMARCA4. STK11, TP53, TSC1, TSC2, and VHL.  The following genes were evaluated for sequence changes only: SDHA and HOXB13 c.251G>A variant only.   Based on Katelyn Porter's personal and family history of cancer, she meets medical criteria for genetic testing. Despite that she meets criteria, she may still have an out of pocket cost. We discussed that if her out of pocket cost for testing is over $100, the laboratory will call and confirm whether she wants to proceed with testing.  If the out of pocket cost of testing is less than $100 she will be billed by the genetic testing laboratory.   PLAN: After considering the risks, benefits, and limitations, Katelyn Porter  provided informed consent to pursue genetic testing and the blood sample was sent to [facility] for analysis of the common hereditary cancer panel. Results should be available within  approximately 2-3 weeks' time, at which point they will be disclosed by telephone to Katelyn Porter, as will any additional recommendations warranted by these results. Katelyn Porter will receive a summary of her genetic counseling visit and a copy of her results once available. This information will also be available in Epic. We encouraged Katelyn Porter to remain in contact with cancer genetics annually so that we can continuously update the family history and inform her of any changes in cancer genetics and testing that may be of benefit for her family. Katelyn Porter questions were answered to her satisfaction today. Our contact information was provided should additional questions or concerns arise.  Lastly, we encouraged Katelyn Porter to remain in contact with cancer genetics annually so that we can continuously update the family history and inform her of any changes in cancer genetics and testing that may be of benefit for this family.   Ms.  Porter questions were answered to her satisfaction today. Our contact information was provided should additional questions or concerns arise. Thank you for the referral and allowing [place] to share in the care of your patient.   Issabela Lesko P. Florene Glen, [place], [facility] Certified Genetic Counselor [email or web address] phone: [phone]  The patient was seen for a total of 35 minutes in face-to-face genetic counseling.  This patient was discussed with Drs. Magrinat, Lindi Adie and/or Burr Medico who agrees with the above.    _______________________________________________________________________ For Office Staff:  Number of people involved in session: 1 Was an Intern/ student involved with case: no

## 2017-06-30 NOTE — Telephone Encounter (Signed)
Received order per Dr. [name] for oncotype testing on core bx. Requisition faxed to [facility].

## 2017-06-30 NOTE — Telephone Encounter (Signed)
Per 4/16 no los. °

## 2017-07-01 ENCOUNTER — Encounter: Payer: Self-pay | Admitting: General Practice

## 2017-07-01 ENCOUNTER — Ambulatory Visit
Admission: RE | Admit: 2017-07-01 | Discharge: 2017-07-01 | Disposition: A | Discharge status: Home / Self Care | Payer: Medicare Other | Source: Ambulatory Visit | Attending: Radiation Oncology | Admitting: Radiation Oncology

## 2017-07-01 ENCOUNTER — Other Ambulatory Visit: Payer: Self-pay

## 2017-07-01 VITALS — BP 125/46 | HR 87 | Temp 98.4°F | Resp 20 | Wt 204.2 lb

## 2017-07-01 DIAGNOSIS — C50411 Malignant neoplasm of upper-outer quadrant of right female breast: Secondary | ICD-10-CM

## 2017-07-01 DIAGNOSIS — Z170 Estrogen receptor positive status [ER+]: Principal | ICD-10-CM

## 2017-07-01 NOTE — Addendum Note (Signed)
Encounter addended by: Zola Button, RN on: 07/01/2017 3:01 PM  Actions taken: Charge Capture section accepted

## 2017-07-01 NOTE — Progress Notes (Signed)
Location of Breast Cancer: Right upper outer quadrant invasive ductal carcinoma  Histology per Pathology Report:  Breast, right, needle core biopsy, 9:15 o'clock, UOQ 06/22/17: - INVASIVE DUCTAL CARCINOMA WITH EXTRACELLULAR MUCIN, SEE COMMENT  Receptor Status: ER(100%), PR (5%), Her2-neu (negative), Ki-(20%)  Did patient present with symptoms (if so, please note symptoms) or was this found on screening mammography?:   06/16/2017: Patient had routine screening mammography showing a possible abnormality in the right breast. She underwent unilateral right breast diagnostic mammography with tomography and right breast ultrasonography at [facility] on 06/21/2017 showing breast density category B. Suspicious new mass in the right breast 9 o'clock central and 4 cm from the nipple measuring 0.8 x 0.6 x 0.7 cm. There was no axillary adenopathy sonographically.    Past/Anticipated interventions by surgeon, if any:  February 2008 Status post left lumpectomy  for a grade 3 ductal carcinoma in situ  06/22/2017: status post right breast upper outer quadrant biopsy  Definitive surgery pending (patient does not want to have a double mastectomy)   Past/Anticipated interventions by medical oncology, if any:   For left grade 3 ductal carcinoma in situ patient was on tamoxifen from July 2008 through March 2013. Dr. Marjie Skiff has requested an Oncotype from the initial biopsy sample.  If that suggests benefit from chemotherapy she will have a port placed at the time of her definitive surgery   Lymphedema issues, if any:  None  Pain issues, if any: None  SAFETY ISSUES:  Prior radiation? Adjuvant radiation (for left side) completed May 2008  Pacemaker/ICD? No  Possible current pregnancy? No  Is the patient on methotrexate? No  Current Complaints / other details:   Katelyn Porter presents today for consult right upper outer quadrant invasive ductal carcinoma. Reports fatigue and trouble  finding enjoyment out of her normal daily activities. Struggles with diet and "eating the right foods".  Denies any pain or skin issues currently of the right breast. She feels overwhelmed and is living at home while her husband, though he is currently in a SNF for rehab after a stroke.     Katelyn Button, RN 07/01/2017,8:59 AM

## 2017-07-01 NOTE — Progress Notes (Signed)
Completed Cancer Policy form for patient and hand delivered to patient in cancer center.

## 2017-07-01 NOTE — Progress Notes (Signed)
[facility] Psychosocial Distress Screening Clinical Social Work  Clinical Social Work was referred by distress screening protocol.  The patient scored a 7 on the Psychosocial Distress Thermometer which indicates moderate distress. Clinical Social Worker Edwyna Shell to assess for distress and other psychosocial needs. Met w patient in [place] office.  Patient wanted to discuss Advance Directives, packet given.  Pt will review, make decisions, and schedule appt to complete this document.  Also discussed various ways to manage stress of cancer diagnosis and treatment.  Patient has been through cancer treatment in the past, aware of impending stressors and thinking about ways to manage.  Husband had stroke in Dec 2018, currently in skilled nursing facility.  Patient works full time at job that is supportive of her needs to attend various treatment related appointments.  Processed importance of proactive self care, prioritizing this as integral part of her treatment.  Patient loves to quilt and sew, restorative activities.  Also has two horses, encouraged to find ways to get help w their care while she is undergoing treatment.  CSW will meet w patient again to complete Advance Directives or to provide other support as needed.    ONCBCN DISTRESS SCREENING 07/01/2017  Screening Type Initial Screening  Distress experienced in past week (1-10) 7  Practical problem type Food  Family Problem type Partner  Emotional problem type Adjusting to illness;Isolation/feeling alone  Physical Problem type Sleep/insomnia    Clinical Social Worker follow up needed: Yes.    If yes, follow up plan: Await patient call to schedule completion of Advance Directives.   Edwyna Shell, LCSW Clinical Social Worker Phone:  [phone]

## 2017-07-01 NOTE — Progress Notes (Signed)
Radiation Oncology         [phone] ________________________________  Initial Outpatient Consultation  Name: Katelyn Porter MRN: [id number]  Date: 07/01/2017  DOB: September 05, 1950  ZD:GLOVFI, Barbarann Ehlers, DO  Cornett, Marcello Moores, MD   REFERRING PHYSICIAN: Erroll Luna, MD  DIAGNOSIS: Clinical T1b N0, stage IA invasive ductal carcinoma, with extracellular mucin; grade 2; estrogen and progesterone receptor positive, HER-2 not amplified, with an MIB-1 of 20%.  HISTORY OF PRESENT ILLNESS::Katelyn Porter is a 67 y.o. female who is presenting to the office today for follow up of new diagnosis of invasive ductal carcinoma of the right breast picked up on routine screening mammography.  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  She was treated with surgery radiation and tamoxifen for 5 years and we released her from follow-up in 2013.   She had routine screening mammography on 06/17/2017 showing: Further evaluation is suggested for possible mass in the right breast. She underwent unilateral right diagnostic mammography with tomography and right breast ultrasonography at [facility] on 06/21/2017 showing: Breast density category B. Suspicious new mass in the right breast.   Accordingly on 06/22/2017 she proceeded to biopsy of the right breast area in question. The pathology from this procedure showed: Breast, right, needle core biopsy, 9:15 o'clock, UOQ with invasive ductal carcinoma with extracellular mucin. Prognostic indicators significant for: ER, 100% positive and PR, 5% positive, both with strong staining intensity. Proliferation marker Ki67 at 20%. HER2 negative.   On review of systems, she reports chronic intermittent ache in the left breast from her prior surgery and radiation treatments. This occasionally will require Tylenol for control.. She denies she denies any problems with nipple discharge or bleeding from either breast. She denies any new bony pain  headaches dizziness or blurred vision and any other symptoms.    PREVIOUS RADIATION THERAPY: yes, treatment was directed at the left breast for high-grade ductal carcinoma in situ. The patient was treated by Dr. Shelby Dubin treatments extended from 06/07/2006 through 07/21/2006. The left breast received 45 gray in 25 fractions,  the patient then received a boost to the tumor bed of 16 gray for a cumulative dose of 61 gray  PAST MEDICAL HISTORY:  has a past medical history of Breast cancer ([place]) (2008), Depression (01/24/2013), Family history of breast cancer, Family history of colon cancer, GERD (gastroesophageal reflux disease), Hyperlipidemia, Hypertension, IBS (irritable bowel syndrome), Palpitations, and Vertigo.    PAST SURGICAL HISTORY: Past Surgical History:  Procedure Laterality Date  . ABDOMINAL HYSTERECTOMY  1985   TAH (ovaries remain)  . BREAST LUMPECTOMY Left 2007  . TONSILLECTOMY     age 30    FAMILY HISTORY: family history includes Breast cancer in her cousin and maternal aunt; Colon cancer in her cousin; Colon cancer (age of onset: 26) in her mother; Coronary artery disease (age of onset: 60) in her brother; Diabetes in her brother, mother, and sister; Heart disease in her father; Hypertension in her mother and sister; Kidney disease in her mother; Lung cancer in her father.  SOCIAL HISTORY:  reports that she has never smoked. She has never used smokeless tobacco. She reports that she does not drink alcohol or use drugs.  ALLERGIES: Crab [shellfish allergy]; Sulfa antibiotics; Lexapro [escitalopram oxalate]; Lipitor [atorvastatin]; and Sulfa drugs cross reactors  MEDICATIONS:  Current Outpatient Medications  Medication Sig Dispense Refill  . DULoxetine (CYMBALTA) 60 MG capsule Take 60 mg by mouth daily.    Marland Kitchen  Multiple Vitamins-Minerals (ONE-A-DAY WOMENS 50 PLUS PO) Take 1 tablet by mouth daily.    . nitrofurantoin, macrocrystal-monohydrate, (MACROBID) 100 MG capsule Take 1  capsule (100 mg total) by mouth 2 (two) times daily. Take for 3 days (Patient not taking: Reported on 07/01/2017) 30 capsule 0  . pantoprazole (PROTONIX) 40 MG tablet TAKE ONE TABLET BY MOUTH ONCE DAILY 90 tablet 0  . pravastatin (PRAVACHOL) 40 MG tablet Take 1 tablet (40 mg total) by mouth daily. 30 tablet 5  . triamterene-hydrochlorothiazide (DYAZIDE) 37.5-25 MG capsule Take by mouth.    . Vitamin D, Ergocalciferol, (DRISDOL) 50000 units CAPS capsule Take 50,000 Units by mouth once a week.     No current facility-administered medications for this encounter.     REVIEW OF SYSTEMS:  REVIEW OF SYSTEMS: A 10+ POINT REVIEW OF SYSTEMS WAS OBTAINED including neurology, dermatology, psychiatry, cardiac, respiratory, lymph, extremities, GI, GU, musculoskeletal, constitutional, reproductive, HEENT. All pertinent positives are noted in the HPI. All others are negative.    PHYSICAL EXAM:  Vitals - 1 value per visit 9/50/9326  SYSTOLIC 712  DIASTOLIC 46  Pulse 87  Temperature 98.4  Respirations 20  Weight (lb) 204.25  Height   BMI 36.76  VISIT REPORT   General: Alert and oriented, in no acute distress HEENT: Head is normocephalic. Extraocular movements are intact. Oropharynx is clear. Neck: Neck is supple, no palpable cervical or supraclavicular lymphadenopathy. Heart: Regular in rate and rhythm with no murmurs, rubs, or gallops. Chest: Clear to auscultation bilaterally, with no rhonchi, wheezes, or rales. Abdomen: Soft, nontender, nondistended, with no rigidity or guarding. Extremities: No cyanosis or edema. Lymphatics: see Neck Exam Skin: No concerning lesions. Musculoskeletal: symmetric strength and muscle tone throughout. Neurologic: Cranial nerves II through XII are grossly intact. No obvious focalities. Speech is fluent. Coordination is intact. Psychiatric: Judgment and insight are intact. Affect is appropriate. Left breast no palpable mass nipple discharge or bleeding area and scar in  the outer aspect of the breast from previous lumpectomy.Overall good cosmetic result. Tattoos noted from previous radiation treatment. Right breast shows some mild induration the lateral aspect of the breast at approximately 9:00 position from her biopsy; no dominant mass appreciated in the breast nipple discharge or bleeding, no axillary adenopathy     ECOG = 0  0 - Asymptomatic (Fully active, able to carry on all predisease activities without restriction)  1 - Symptomatic but completely ambulatory (Restricted in physically strenuous activity but ambulatory and able to carry out work of a light or sedentary nature. For example, light housework, office work)  2 - Symptomatic, <50% in bed during the day (Ambulatory and capable of all self care but unable to carry out any work activities. Up and about more than 50% of waking hours)  3 - Symptomatic, >50% in bed, but not bedbound (Capable of only limited self-care, confined to bed or chair 50% or more of waking hours)  4 - Bedbound (Completely disabled. Cannot carry on any self-care. Totally confined to bed or chair)  5 - Death   Eustace Pen MM, Creech RH, Tormey DC, et al. [phone]). "Toxicity and response criteria of the [facility]". [place] Oncol. 5 (6): 649-55  LABORATORY DATA:  Lab Results  Component Value Date   WBC 6.5 06/29/2017   HGB 12.7 02/24/2016   HCT 43.6 06/29/2017   MCV 89.3 06/29/2017   PLT 296 06/29/2017   NEUTROABS 3.2 06/29/2017   Lab Results  Component Value Date  NA 139 06/29/2017   K 4.1 06/29/2017   CL 103 06/29/2017   CO2 25 06/29/2017   GLUCOSE 108 (H) 06/29/2017   CREATININE 1.04 (H) 06/29/2017   CALCIUM 10.2 06/29/2017      RADIOGRAPHY: US Breast Ltd Uni Right Inc Axilla  Result Date: 06/21/2017 CLINICAL DATA:  The patient was called back for a right breast mass EXAM: DIGITAL DIAGNOSTIC RIGHT MAMMOGRAM WITH TOMO ULTRASOUND RIGHT BREAST COMPARISON:  Previous exam(s). ACR Breast  Density Category b: There are scattered areas of fibroglandular density. FINDINGS: The irregular mass in the lateral central right breast persists on additional imaging. On physical exam, no suspicious lumps are identified. Targeted ultrasound is performed, showing an irregular mass at 9 o'clock, 4 cm from the nipple measuring 8 x 6 x 7 mm, correlating with the mammographic finding. No axillary adenopathy. IMPRESSION: Suspicious new mass in the right breast. RECOMMENDATION: Ultrasound-guided biopsy of the new right breast mass. I have discussed the findings and recommendations with the patient. Results were also provided in writing at the conclusion of the visit. If applicable, a reminder letter will be sent to the patient regarding the next appointment. BI-RADS CATEGORY  4: Suspicious. Electronically Signed   By: [name]   On: 06/21/2017 10:51   Mm Diag Breast Tomo Uni Right  Result Date: 06/21/2017 CLINICAL DATA:  The patient was called back for a right breast mass EXAM: DIGITAL DIAGNOSTIC RIGHT MAMMOGRAM WITH TOMO ULTRASOUND RIGHT BREAST COMPARISON:  Previous exam(s). ACR Breast Density Category b: There are scattered areas of fibroglandular density. FINDINGS: The irregular mass in the lateral central right breast persists on additional imaging. On physical exam, no suspicious lumps are identified. Targeted ultrasound is performed, showing an irregular mass at 9 o'clock, 4 cm from the nipple measuring 8 x 6 x 7 mm, correlating with the mammographic finding. No axillary adenopathy. IMPRESSION: Suspicious new mass in the right breast. RECOMMENDATION: Ultrasound-guided biopsy of the new right breast mass. I have discussed the findings and recommendations with the patient. Results were also provided in writing at the conclusion of the visit. If applicable, a reminder letter will be sent to the patient regarding the next appointment. BI-RADS CATEGORY  4: Suspicious. Electronically Signed   By: [name]   On: 06/21/2017 10:51   Mm Screening Breast Tomo Bilateral  Result Date: 06/17/2017 CLINICAL DATA:  Screening. EXAM: DIGITAL SCREENING BILATERAL MAMMOGRAM WITH TOMO AND CAD COMPARISON:  Previous exam(s). ACR Breast Density Category b: There are scattered areas of fibroglandular density. FINDINGS: In the right breast, a possible mass warrants further evaluation. In the left breast, no findings suspicious for malignancy. Images were processed with CAD. IMPRESSION: Further evaluation is suggested for possible mass in the right breast. RECOMMENDATION: Diagnostic mammogram and possibly ultrasound of the right breast. (Code:[id number]) The patient will be contacted regarding the findings, and additional imaging will be scheduled. BI-RADS CATEGORY  0: Incomplete. Need additional imaging evaluation and/or prior mammograms for comparison. Electronically Signed   By: Marin Olp M.D.   On: 06/17/2017 11:53   Mm Clip Placement Right  Result Date: 06/22/2017 CLINICAL DATA:  Status post right breast ultrasound-guided biopsy. EXAM: DIAGNOSTIC RIGHT MAMMOGRAM POST ULTRASOUND BIOPSY COMPARISON:  Previous exam(s). FINDINGS: Mammographic images were obtained following ultrasound guided biopsy of a small lateral right breast mass. The ribbon shaped biopsy clip lies the anterior margin of the mass. IMPRESSION: Well-positioned ribbon shaped biopsy clip following ultrasound-guided core needle biopsy of a lateral  right breast mass. Final Assessment: Post Procedure Mammograms for Marker Placement Electronically Signed   By: Lajean Manes M.D.   On: 06/22/2017 13:40   [place] Rt Breast Bx W Loc Dev 1st Lesion Img Bx Spec US Guide  Addendum Date: 06/24/2017   ADDENDUM REPORT: 06/24/2017 07:38 ADDENDUM: Pathology revealed GRADE II INVASIVE DUCTAL CARCINOMA WITH EXTRACELLULAR MUCIN of the Right breast, 9:15 o'clock, upper outer quadrant. This was found to be concordant by Dr. Lajean Manes. Pathology results were  discussed with the patient by telephone. The patient reported doing well after the biopsy with tenderness at the site. Post biopsy instructions and care were reviewed and questions were answered. The patient was encouraged to call [facility] for any additional concerns. Surgical consultation has been arranged with Dr. Erroll Luna, per patient request, at [facility] on June 25, 2017. Pathology results reported by Terie Purser, RN on 06/24/2017. Electronically Signed   By: Lajean Manes M.D.   On: 06/24/2017 07:38   Result Date: 06/24/2017 CLINICAL DATA:  Patient presents for ultrasound-guided core needle biopsy of a small mass the lateral right breast, described as at 9 o'clock, projecting slightly above midline towards the 9:15 o'clock position. EXAM: ULTRASOUND GUIDED RIGHT BREAST CORE NEEDLE BIOPSY COMPARISON:  Previous exam(s). FINDINGS: I met with the patient and we discussed the procedure of ultrasound-guided biopsy, including benefits and alternatives. We discussed the high likelihood of a successful procedure. We discussed the risks of the procedure, including infection, bleeding, tissue injury, clip migration, and inadequate sampling. Informed written consent was given. The usual time-out protocol was performed immediately prior to the procedure. Lesion quadrant: Upper outer quadrant Using sterile technique and 1% Lidocaine as local anesthetic, under direct ultrasound visualization, a 12 gauge spring-loaded device was used to perform biopsy of the small irregular hypoechoic right breast mass using a inferior, lateral approach. At the conclusion of the procedure a ribbon shaped tissue marker clip was deployed into the biopsy cavity. Follow up 2 view mammogram was performed and dictated separately. IMPRESSION: Ultrasound guided biopsy of a small right breast mass. No apparent complications. Electronically Signed: By: Lajean Manes M.D. On: 06/22/2017 13:30        IMPRESSION: ASSESSMENT: 67 y.o. [place] , [place] woman status post left lumpectomy February 2008 for a grade 3 ductal carcinoma in situ which was strongly estrogen and progesterone receptor positive.             (a)  adjuvant radiation completed May 2008             (b)  on tamoxifen July 2008 through March 2013.  (1) status post right breast upper outer quadrant biopsy 06/22/2017 for a clinical T1b N0, stage IA invasive ductal carcinoma, with extracellular mucin; grade 2; estrogen and progesterone receptor positive, HER-2 not amplified, with an MIB-1 of 20%.   PLAN:  (1) the patient will be scheduled for surgery with Dr.Tom Cornett in the near future. It is anticipated the patient will proceed with a right lumpectomy and sentinel node procedure.  (2) genetics testing 06/30/2017  (3) Oncotype requested from the 06/22/2017 biopsy  (4) definitive surgery pending  (5) adjuvant radiation to follow, we will recommend conventional radiation therapy since she has had previous treatment to the left breast with the potential overlap over the sternum and therefore would not recommend hypo-fractionated accelerated treatment.  (6) antiestrogens to start at the completion of local treatment     ------------------------------------------------  Nelda Severe.  Sondra Come, PhD, MD    This document serves as a record of services personally performed by Gery Pray, MD. It was created on his behalf by Steva Colder, a trained medical scribe. The creation of this record is based on the scribe's personal observations and the provider's statements to them. This document has been checked and approved by the attending provider.

## 2017-07-06 ENCOUNTER — Other Ambulatory Visit: Payer: Self-pay | Admitting: Surgery

## 2017-07-06 ENCOUNTER — Ambulatory Visit: Payer: Medicare Other | Admitting: Oncology

## 2017-07-06 DIAGNOSIS — C50911 Malignant neoplasm of unspecified site of right female breast: Secondary | ICD-10-CM

## 2017-07-08 DIAGNOSIS — Z0289 Encounter for other administrative examinations: Secondary | ICD-10-CM

## 2017-07-13 ENCOUNTER — Encounter: Payer: Self-pay | Admitting: Radiation Oncology

## 2017-07-13 ENCOUNTER — Other Ambulatory Visit: Payer: Self-pay

## 2017-07-13 ENCOUNTER — Telehealth: Payer: Self-pay | Admitting: Genetic Counselor

## 2017-07-13 ENCOUNTER — Encounter: Payer: Self-pay | Admitting: Genetic Counselor

## 2017-07-13 ENCOUNTER — Telehealth: Payer: Self-pay | Admitting: *Deleted

## 2017-07-13 ENCOUNTER — Encounter (HOSPITAL_BASED_OUTPATIENT_CLINIC_OR_DEPARTMENT_OTHER): Payer: Self-pay | Admitting: *Deleted

## 2017-07-13 DIAGNOSIS — Z1379 Encounter for other screening for genetic and chromosomal anomalies: Secondary | ICD-10-CM | POA: Insufficient documentation

## 2017-07-13 HISTORY — DX: Encounter for other screening for genetic and chromosomal anomalies: Z13.79

## 2017-07-13 NOTE — Progress Notes (Signed)
Bring all medications. Coming in this Friday for EKG and Ensure.

## 2017-07-13 NOTE — Telephone Encounter (Signed)
Received oncotype score of 25/12%. Physician team notified. Called pt and discuss results and that as of now she does not need chemotherapy. Informed she will still need xrt after surgery. Denies further questions at this time.

## 2017-07-13 NOTE — Telephone Encounter (Signed)
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  This is a moderate risk gene that plays a part in the diagnosis of breast cancer.  She will come in for consultation on this on May 8 at 11 AM. 

## 2017-07-14 ENCOUNTER — Encounter: Payer: Self-pay | Admitting: *Deleted

## 2017-07-14 NOTE — Progress Notes (Signed)
On 07-14-17 fax medical records to [facility]. It was consult note, end of tx note, follow up note, sim & planning note

## 2017-07-16 ENCOUNTER — Encounter (HOSPITAL_BASED_OUTPATIENT_CLINIC_OR_DEPARTMENT_OTHER)
Admission: RE | Admit: 2017-07-16 | Discharge: 2017-07-16 | Disposition: A | Discharge status: Home / Self Care | Payer: Medicare Other | Source: Ambulatory Visit | Attending: Surgery | Admitting: Surgery

## 2017-07-16 DIAGNOSIS — I10 Essential (primary) hypertension: Secondary | ICD-10-CM | POA: Diagnosis present

## 2017-07-21 ENCOUNTER — Encounter: Payer: Self-pay | Admitting: Genetic Counselor

## 2017-07-21 ENCOUNTER — Ambulatory Visit
Admission: RE | Admit: 2017-07-21 | Discharge: 2017-07-21 | Disposition: A | Discharge status: Home / Self Care | Payer: Medicare Other | Source: Ambulatory Visit | Attending: Surgery | Admitting: Surgery

## 2017-07-21 ENCOUNTER — Inpatient Hospital Stay: Payer: Medicare Other | Attending: Oncology | Admitting: Genetic Counselor

## 2017-07-21 DIAGNOSIS — Z8041 Family history of malignant neoplasm of ovary: Secondary | ICD-10-CM | POA: Insufficient documentation

## 2017-07-21 DIAGNOSIS — Z801 Family history of malignant neoplasm of trachea, bronchus and lung: Secondary | ICD-10-CM

## 2017-07-21 DIAGNOSIS — Z7183 Encounter for nonprocreative genetic counseling: Secondary | ICD-10-CM | POA: Diagnosis not present

## 2017-07-21 DIAGNOSIS — Z1379 Encounter for other screening for genetic and chromosomal anomalies: Secondary | ICD-10-CM

## 2017-07-21 DIAGNOSIS — Z803 Family history of malignant neoplasm of breast: Secondary | ICD-10-CM | POA: Diagnosis not present

## 2017-07-21 DIAGNOSIS — Z800 Family history of malignant neoplasm of digestive organs: Secondary | ICD-10-CM

## 2017-07-21 DIAGNOSIS — Z170 Estrogen receptor positive status [ER+]: Secondary | ICD-10-CM

## 2017-07-21 DIAGNOSIS — C50911 Malignant neoplasm of unspecified site of right female breast: Secondary | ICD-10-CM

## 2017-07-21 DIAGNOSIS — C50411 Malignant neoplasm of upper-outer quadrant of right female breast: Secondary | ICD-10-CM

## 2017-07-21 HISTORY — DX: Family history of malignant neoplasm of ovary: Z80.41

## 2017-07-21 NOTE — Progress Notes (Signed)
GENETIC TEST RESULTS   Patient Name: Katelyn Porter Patient Age: 67 y.o. Encounter Date: 07/21/2017  Referring Provider: Lurline Del, MD  Erroll Luna, MD    Ms. Benthall was seen in the [place] clinic on Jul 21, 2017 due to a personal and family history of cancer and concern regarding a hereditary predisposition to cancer in the family. Please refer to the prior Genetics clinic note for more information regarding Ms. Leppanen's medical and family histories and our assessment at the time.   FAMILY HISTORY:  We obtained a detailed, 4-generation family history.  Significant diagnoses are listed below: Family History  Problem Relation Age of Onset  . Lung cancer Father        Died at 43 from lung cancer  . Heart disease Father        heart attack  . Colon cancer Mother 29  . Hypertension Mother   . Kidney disease Mother   . Diabetes Mother   . Hypertension Sister   . Diabetes Sister   . Breast cancer Maternal Aunt        dx in her 43s  . Coronary artery disease Brother 47       died of "massive heart attack"  . Diabetes Brother   . Colon cancer Cousin        maternal 1st cousin dx over 69  . Ovarian cancer Paternal Grandmother   . Breast cancer Cousin        mat 1st cousin dx in her 15s    The patient does not have children.  She has a brother and sister who are cancer free.  Her brother died of a heart attack.  Both parents are deceased.    The patient's mother had colon cancer in her late 57's.  She had 9 siblings.  One sister had breast cancer in her 74's, a second sister had a daughter with breast cancer in her 10's, and a third sister had a daughter with colon cancer.  The maternal grandparents are deceased from non cancer related issues.  The patient's father had an oral cancer that spread to his lungs.  He died at 38.  He had a brother and sister who were cancer free. His parents are both deceased.  His mother reportedly had ovarian cancer, but his father died  from non-cancer related issues.  Ms. Subia is unaware of previous family history of genetic testing for hereditary cancer risks. Patient's maternal ancestors are of [place] American descent, and paternal ancestors are of [place] American and Native American descent. There is no reported Ashkenazi Jewish ancestry. There is no known consanguinity.  GENETIC TESTING:  At the time of Ms. Hillock's visit, we recommended she pursue genetic testing of the common hereditary cancer panel. 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 There were no deleterious mutations in APC, AXIN2, BARD1, BMPR1A, BRCA1, BRCA2, BRIP1, CDH1, CDK4, CDKN2A, CHEK2, DICER1, EPCAM, GREM1, KIT, MEN1, MLH1, MSH2, MSH6, MUTYH, NBN, NF1, PALB2, PDGFRA, PMS2, POLD1, POLE, PTEN, RAD50, RAD51C, RAD51D, SDHA, SDHB, SDHC, SDHD, SMAD4, SMARCA4. STK11, TP53, TSC1, TSC2, and VHL.   Genetic testing did detect two Variant of Unknown Significance - one in the DICER1 gene called c.4405C>A (p.[id number]) and the other in POLE called c.3245G>A (p.Arg1082His).  At this time, it is unknown if these variants are associated with increased cancer risk or if they are a normal finding, but  most variants such as these get reclassified to being inconsequential. They should not be used to make medical management decisions. With time, we suspect the lab will determine the significance of these variants, if any. If we do learn more about them, we will try to contact Ms. Strout to discuss it further. However, it is important to stay in touch with [place] periodically and keep the address and phone number up to date.  DISCUSSION: The ATM gene is involved in the detection and surveillance of DNA damage.  ATM phosphorylation of BRCA1 is critical for proper response to DNA double-strand breaks.  This is believed to be the reason for the role  ATM has in breast cancer risk.  Individuals with a ATM mutation are at a greater risk for having children with Ataxia-telangiectasia (A-T). AT is characterized by progressive cerebellar degeneration (ataxia), dilated blood vessels in the eyes and skin (telangiectasia), immunodeficiency, chromosomal instability, increased sensitivity to ionizing radiation and a predisposition to lymphoma and leukemia.  Therefore, individuals of childbearing age who have a known ATM mutation may want to consider having their spouse tested to determine their risk for having a child with A-T.  Studies of these families demonstrated increased incident of breast cancer in the mothers (who are heterozygous carriers) of the affected children, thus prompting further evaluation of the relationship between breast cancer and ATM.  Women who are heterozygous ATM carriers have an increase breast cancer risk.  They have 5-fold increased risk of breast cancer <50 years, and 2-3 fold increased risk for breast cancer overall.  In families with familial breast cancer that were negative for BRCA1 or BRCA2 genes, approximately 2.7% of women were found to have one ATM mutation.  In families with both breast cancer and leukemia, 6.7% of women were found to have one ATM mutation.  Management for individuals with ATM mutations can be found in the NCCN guidelines (v.3.2019). These guidelines recommend the following:  Breast Cancer   Screening: Annual mammogram with consideration of tomosynthesis and consider breast MRI with contrast starting at age 88 years.  Risk Reducing Mastectomy: Evidence insufficient, consider based on family history  Ovarian Cancer  No increased risk for ovarian cancer, therefore no recommendations  Other Cancer Risks  Unknown or insufficient evidence for pancreatic or prostate cancer risk  We discussed that this result could make a difference in her decision about her surgery.  She states that she is not  ready to consider a double mastectomy, and will proceed with her planned lumpectomy.  FAMILY MEMBERS: It is important that all of Ms. Cronin's relatives (both men and women) know of the presence of this gene mutation. Site-specific genetic testing can sort out who in the family is at risk and who is not.   Ms. Rensch siblings have a 50% chance to have inherited this mutation. We recommend they have genetic testing for this same mutation, as identifying the presence of this mutation would allow them to also take advantage of risk-reducing measures.   SUPPORT AND RESOURCES: If Ms. Bays is interested in ATM-specific information and support, there are two groups, Facing Our Risk (www.facingourrisk.com) and Bright Pink (www.brightpink.org) which some people have found useful. They provide opportunities to speak with other individuals from high-risk families. To locate genetic counselors in other cities, visit the website of the [employer] (ArtistMovie.se) and Secretary/administrator for a Social worker by zip code.  We encouraged Ms. Dvorsky to remain in contact with [place] on an annual basis so we  can update her personal and family histories, and let her know of advances in cancer genetics that may benefit the family. Our contact number was provided. Ms. Faust questions were answered to her satisfaction today, and she knows she is welcome to call anytime with additional questions.   Tayley Mudrick P. Florene Glen, [place], [facility] Certified Genetic Counselor [email or web address] phone: [phone]

## 2017-07-22 ENCOUNTER — Ambulatory Visit (HOSPITAL_COMMUNITY)
Admission: RE | Admit: 2017-07-22 | Discharge: 2017-07-22 | Disposition: A | Discharge status: Home / Self Care | Payer: Medicare Other | Source: Ambulatory Visit | Attending: Surgery | Admitting: Surgery

## 2017-07-22 ENCOUNTER — Ambulatory Visit (HOSPITAL_BASED_OUTPATIENT_CLINIC_OR_DEPARTMENT_OTHER): Payer: Medicare Other | Admitting: Certified Registered Nurse Anesthetist

## 2017-07-22 ENCOUNTER — Other Ambulatory Visit: Payer: Self-pay

## 2017-07-22 ENCOUNTER — Ambulatory Visit
Admission: RE | Admit: 2017-07-22 | Discharge: 2017-07-22 | Disposition: A | Discharge status: Home / Self Care | Payer: Medicare Other | Source: Ambulatory Visit | Attending: Surgery | Admitting: Surgery

## 2017-07-22 ENCOUNTER — Ambulatory Visit (HOSPITAL_BASED_OUTPATIENT_CLINIC_OR_DEPARTMENT_OTHER)
Admission: RE | Admit: 2017-07-22 | Discharge: 2017-07-22 | Disposition: A | Discharge status: Home / Self Care | Payer: Medicare Other | Source: Ambulatory Visit | Attending: Surgery | Admitting: Surgery

## 2017-07-22 ENCOUNTER — Encounter (HOSPITAL_BASED_OUTPATIENT_CLINIC_OR_DEPARTMENT_OTHER)
Admission: RE | Disposition: A | Discharge status: Home / Self Care | Payer: Self-pay | Source: Ambulatory Visit | Attending: Surgery

## 2017-07-22 ENCOUNTER — Encounter (HOSPITAL_BASED_OUTPATIENT_CLINIC_OR_DEPARTMENT_OTHER): Payer: Self-pay | Admitting: *Deleted

## 2017-07-22 DIAGNOSIS — C50411 Malignant neoplasm of upper-outer quadrant of right female breast: Secondary | ICD-10-CM | POA: Diagnosis not present

## 2017-07-22 DIAGNOSIS — I10 Essential (primary) hypertension: Secondary | ICD-10-CM | POA: Insufficient documentation

## 2017-07-22 DIAGNOSIS — Z79899 Other long term (current) drug therapy: Secondary | ICD-10-CM | POA: Diagnosis not present

## 2017-07-22 DIAGNOSIS — K219 Gastro-esophageal reflux disease without esophagitis: Secondary | ICD-10-CM | POA: Diagnosis not present

## 2017-07-22 DIAGNOSIS — C50911 Malignant neoplasm of unspecified site of right female breast: Secondary | ICD-10-CM

## 2017-07-22 DIAGNOSIS — I739 Peripheral vascular disease, unspecified: Secondary | ICD-10-CM | POA: Diagnosis not present

## 2017-07-22 DIAGNOSIS — F329 Major depressive disorder, single episode, unspecified: Secondary | ICD-10-CM | POA: Diagnosis not present

## 2017-07-22 HISTORY — DX: Adverse effect of unspecified anesthetic, initial encounter: T41.45XA

## 2017-07-22 HISTORY — PX: BREAST LUMPECTOMY WITH RADIOACTIVE SEED AND SENTINEL LYMPH NODE BIOPSY: SHX6550

## 2017-07-22 HISTORY — DX: Other complications of anesthesia, initial encounter: T88.59XA

## 2017-07-22 SURGERY — BREAST LUMPECTOMY WITH RADIOACTIVE SEED AND SENTINEL LYMPH NODE BIOPSY
Anesthesia: General | Site: Breast | Laterality: Right

## 2017-07-22 MED ORDER — BUPIVACAINE-EPINEPHRINE (PF) 0.25% -1:200000 IJ SOLN
INTRAMUSCULAR | Status: DC | PRN
  Administered 2017-07-22: 10 mL

## 2017-07-22 MED ORDER — PROPOFOL 500 MG/50ML IV EMUL
INTRAVENOUS | Status: AC
  Filled 2017-07-22: qty 50

## 2017-07-22 MED ORDER — OXYCODONE HCL 5 MG PO TABS
5.0000 mg | ORAL_TABLET | Freq: Once | ORAL | Status: AC | PRN
  Administered 2017-07-22: 5 mg via ORAL

## 2017-07-22 MED ORDER — TECHNETIUM TC 99M SULFUR COLLOID FILTERED
1.0000 | Freq: Once | INTRAVENOUS | Status: AC | PRN
  Administered 2017-07-22: 1 via INTRADERMAL

## 2017-07-22 MED ORDER — GABAPENTIN 300 MG PO CAPS
300.0000 mg | ORAL_CAPSULE | ORAL | Status: AC
  Administered 2017-07-22: 300 mg via ORAL

## 2017-07-22 MED ORDER — IBUPROFEN 800 MG PO TABS: 800.0000 mg | ORAL_TABLET | Freq: Three times a day (TID) | ORAL | 0 refills | Status: DC | PRN

## 2017-07-22 MED ORDER — ACETAMINOPHEN 500 MG PO TABS
ORAL_TABLET | ORAL | Status: AC
  Filled 2017-07-22: qty 2

## 2017-07-22 MED ORDER — SODIUM CHLORIDE 0.9 % IJ SOLN
INTRAVENOUS | Status: DC | PRN
  Administered 2017-07-22: 5 mL via INTRAMUSCULAR

## 2017-07-22 MED ORDER — GABAPENTIN 300 MG PO CAPS
ORAL_CAPSULE | ORAL | Status: AC
  Filled 2017-07-22: qty 1

## 2017-07-22 MED ORDER — ROPIVACAINE HCL 7.5 MG/ML IJ SOLN
INTRAMUSCULAR | Status: DC | PRN
  Administered 2017-07-22 (×2): 30 mL via PERINEURAL

## 2017-07-22 MED ORDER — CEFAZOLIN SODIUM-DEXTROSE 2-4 GM/100ML-% IV SOLN
INTRAVENOUS | Status: AC
  Filled 2017-07-22: qty 100

## 2017-07-22 MED ORDER — ONDANSETRON HCL 4 MG/2ML IJ SOLN
INTRAMUSCULAR | Status: AC
  Filled 2017-07-22: qty 2

## 2017-07-22 MED ORDER — DEXTROSE 5 % IV SOLN
3.0000 g | INTRAVENOUS | Status: AC
  Administered 2017-07-22: 2 g via INTRAVENOUS

## 2017-07-22 MED ORDER — ACETAMINOPHEN 500 MG PO TABS
1000.0000 mg | ORAL_TABLET | ORAL | Status: AC
  Administered 2017-07-22: 1000 mg via ORAL

## 2017-07-22 MED ORDER — ONDANSETRON HCL 4 MG/2ML IJ SOLN
INTRAMUSCULAR | Status: DC | PRN
  Administered 2017-07-22: 4 mg via INTRAVENOUS

## 2017-07-22 MED ORDER — ENSURE PRE-SURGERY PO LIQD: 296.0000 mL | Freq: Once | ORAL | Status: DC

## 2017-07-22 MED ORDER — FENTANYL CITRATE (PF) 100 MCG/2ML IJ SOLN
INTRAMUSCULAR | Status: AC
  Filled 2017-07-22: qty 2

## 2017-07-22 MED ORDER — DEXAMETHASONE SODIUM PHOSPHATE 10 MG/ML IJ SOLN
INTRAMUSCULAR | Status: AC
Start: 2017-07-22 — End: ?
  Filled 2017-07-22: qty 1

## 2017-07-22 MED ORDER — CHLORHEXIDINE GLUCONATE CLOTH 2 % EX PADS: 6.0000 | MEDICATED_PAD | Freq: Once | CUTANEOUS | Status: DC

## 2017-07-22 MED ORDER — FENTANYL CITRATE (PF) 100 MCG/2ML IJ SOLN
25.0000 ug | INTRAMUSCULAR | Status: DC | PRN
  Administered 2017-07-22 (×2): 25 ug via INTRAVENOUS

## 2017-07-22 MED ORDER — DEXAMETHASONE SODIUM PHOSPHATE 10 MG/ML IJ SOLN
INTRAMUSCULAR | Status: AC
  Filled 2017-07-22: qty 1

## 2017-07-22 MED ORDER — PHENYLEPHRINE HCL 10 MG/ML IJ SOLN
INTRAVENOUS | Status: DC | PRN
  Administered 2017-07-22: 50 ug/min via INTRAVENOUS

## 2017-07-22 MED ORDER — PHENYLEPHRINE HCL 10 MG/ML IJ SOLN
INTRAMUSCULAR | Status: AC
  Filled 2017-07-22: qty 1

## 2017-07-22 MED ORDER — LIDOCAINE HCL (CARDIAC) PF 100 MG/5ML IV SOSY
PREFILLED_SYRINGE | INTRAVENOUS | Status: AC
  Filled 2017-07-22: qty 5

## 2017-07-22 MED ORDER — MIDAZOLAM HCL 2 MG/2ML IJ SOLN
1.0000 mg | INTRAMUSCULAR | Status: DC | PRN
  Administered 2017-07-22: 2 mg via INTRAVENOUS

## 2017-07-22 MED ORDER — SCOPOLAMINE 1 MG/3DAYS TD PT72: 1.0000 | MEDICATED_PATCH | Freq: Once | TRANSDERMAL | Status: DC | PRN

## 2017-07-22 MED ORDER — MIDAZOLAM HCL 2 MG/2ML IJ SOLN
INTRAMUSCULAR | Status: AC
Start: 2017-07-22 — End: ?
  Filled 2017-07-22: qty 2

## 2017-07-22 MED ORDER — OXYCODONE HCL 5 MG PO TABS
ORAL_TABLET | ORAL | Status: AC
  Filled 2017-07-22: qty 1

## 2017-07-22 MED ORDER — FENTANYL CITRATE (PF) 100 MCG/2ML IJ SOLN
50.0000 ug | INTRAMUSCULAR | Status: DC | PRN
  Administered 2017-07-22: 100 ug via INTRAVENOUS

## 2017-07-22 MED ORDER — PROPOFOL 10 MG/ML IV BOLUS
INTRAVENOUS | Status: AC
  Filled 2017-07-22: qty 20

## 2017-07-22 MED ORDER — MEPERIDINE HCL 25 MG/ML IJ SOLN: 6.2500 mg | INTRAMUSCULAR | Status: DC | PRN

## 2017-07-22 MED ORDER — EPHEDRINE SULFATE 50 MG/ML IJ SOLN
INTRAMUSCULAR | Status: DC | PRN
  Administered 2017-07-22: 10 mg via INTRAVENOUS

## 2017-07-22 MED ORDER — PROPOFOL 10 MG/ML IV BOLUS
INTRAVENOUS | Status: DC | PRN
  Administered 2017-07-22: 200 mg via INTRAVENOUS

## 2017-07-22 MED ORDER — CLONIDINE HCL (ANALGESIA) 100 MCG/ML EP SOLN
EPIDURAL | Status: DC | PRN
  Administered 2017-07-22: 50 ug

## 2017-07-22 MED ORDER — FENTANYL CITRATE (PF) 100 MCG/2ML IJ SOLN
INTRAMUSCULAR | Status: AC
Start: 2017-07-22 — End: ?
  Filled 2017-07-22: qty 2

## 2017-07-22 MED ORDER — EPHEDRINE SULFATE 50 MG/ML IJ SOLN
INTRAMUSCULAR | Status: AC
  Filled 2017-07-22: qty 1

## 2017-07-22 MED ORDER — LIDOCAINE 2% (20 MG/ML) 5 ML SYRINGE
INTRAMUSCULAR | Status: DC | PRN
  Administered 2017-07-22: 40 mg via INTRAVENOUS

## 2017-07-22 MED ORDER — LACTATED RINGERS IV SOLN
INTRAVENOUS | Status: DC
  Administered 2017-07-22: 08:00:00 via INTRAVENOUS

## 2017-07-22 MED ORDER — HYDROCODONE-ACETAMINOPHEN 5-325 MG PO TABS: 1.0000 | ORAL_TABLET | Freq: Four times a day (QID) | ORAL | 0 refills | Status: DC | PRN

## 2017-07-22 MED ORDER — DEXAMETHASONE SODIUM PHOSPHATE 10 MG/ML IJ SOLN
INTRAMUSCULAR | Status: DC | PRN
  Administered 2017-07-22: 10 mg via INTRAVENOUS

## 2017-07-22 MED ORDER — PROMETHAZINE HCL 25 MG/ML IJ SOLN: 6.2500 mg | INTRAMUSCULAR | Status: DC | PRN

## 2017-07-22 MED ORDER — OXYCODONE HCL 5 MG/5ML PO SOLN: 5.0000 mg | Freq: Once | ORAL | Status: AC | PRN

## 2017-07-22 SURGICAL SUPPLY — 55 items
ADH SKN CLS APL DERMABOND .7 (GAUZE/BANDAGES/DRESSINGS) ×1
APPLIER CLIP 9.375 MED OPEN (MISCELLANEOUS) ×3
APR CLP MED 9.3 20 MLT OPN (MISCELLANEOUS) ×1
BINDER BREAST LRG (GAUZE/BANDAGES/DRESSINGS) IMPLANT
BINDER BREAST MEDIUM (GAUZE/BANDAGES/DRESSINGS) IMPLANT
BINDER BREAST XLRG (GAUZE/BANDAGES/DRESSINGS) IMPLANT
BINDER BREAST XXLRG (GAUZE/BANDAGES/DRESSINGS) ×2 IMPLANT
BLADE SURG 15 STRL LF DISP TIS (BLADE) ×1 IMPLANT
BLADE SURG 15 STRL SS (BLADE) ×3
CANISTER SUC SOCK COL 7IN (MISCELLANEOUS) IMPLANT
CANISTER SUCT 1200ML W/VALVE (MISCELLANEOUS) ×3 IMPLANT
CHLORAPREP W/TINT 26ML (MISCELLANEOUS) ×3 IMPLANT
CLIP APPLIE 9.375 MED OPEN (MISCELLANEOUS) ×1 IMPLANT
COVER BACK TABLE 60X90IN (DRAPES) ×3 IMPLANT
COVER MAYO STAND STRL (DRAPES) ×3 IMPLANT
COVER PROBE W GEL 5X96 (DRAPES) ×3 IMPLANT
DECANTER SPIKE VIAL GLASS SM (MISCELLANEOUS) IMPLANT
DERMABOND ADVANCED (GAUZE/BANDAGES/DRESSINGS) ×2
DERMABOND ADVANCED .7 DNX12 (GAUZE/BANDAGES/DRESSINGS) ×1 IMPLANT
DEVICE DUBIN W/COMP PLATE 8390 (MISCELLANEOUS) ×3 IMPLANT
DRAPE LAPAROSCOPIC ABDOMINAL (DRAPES) ×3 IMPLANT
DRAPE UTILITY XL STRL (DRAPES) ×3 IMPLANT
ELECT COATED BLADE 2.86 ST (ELECTRODE) ×3 IMPLANT
ELECT REM PT RETURN 9FT ADLT (ELECTROSURGICAL) ×3
ELECTRODE REM PT RTRN 9FT ADLT (ELECTROSURGICAL) ×1 IMPLANT
GLOVE BIO SURGEON STRL SZ7 (GLOVE) ×4 IMPLANT
GLOVE BIOGEL PI IND STRL 7.0 (GLOVE) IMPLANT
GLOVE BIOGEL PI IND STRL 7.5 (GLOVE) IMPLANT
GLOVE BIOGEL PI IND STRL 8 (GLOVE) ×1 IMPLANT
GLOVE BIOGEL PI INDICATOR 7.0 (GLOVE) ×2
GLOVE BIOGEL PI INDICATOR 7.5 (GLOVE) ×4
GLOVE BIOGEL PI INDICATOR 8 (GLOVE) ×2
GLOVE ECLIPSE 8.0 STRL XLNG CF (GLOVE) ×3 IMPLANT
GOWN STRL REUS W/ TWL LRG LVL3 (GOWN DISPOSABLE) ×2 IMPLANT
GOWN STRL REUS W/TWL LRG LVL3 (GOWN DISPOSABLE) ×6
HEMOSTAT ARISTA ABSORB 3G PWDR (MISCELLANEOUS) IMPLANT
HEMOSTAT SNOW SURGICEL 2X4 (HEMOSTASIS) ×1 IMPLANT
KIT MARKER MARGIN INK (KITS) ×3 IMPLANT
NDL HYPO 25X1 1.5 SAFETY (NEEDLE) ×1 IMPLANT
NDL SAFETY ECLIPSE 18X1.5 (NEEDLE) IMPLANT
NEEDLE HYPO 18GX1.5 SHARP (NEEDLE) ×3
NEEDLE HYPO 25X1 1.5 SAFETY (NEEDLE) ×6 IMPLANT
NS IRRIG 1000ML POUR BTL (IV SOLUTION) ×3 IMPLANT
PACK BASIN DAY SURGERY FS (CUSTOM PROCEDURE TRAY) ×3 IMPLANT
PENCIL BUTTON HOLSTER BLD 10FT (ELECTRODE) ×3 IMPLANT
SLEEVE SCD COMPRESS KNEE MED (MISCELLANEOUS) ×3 IMPLANT
SPONGE LAP 4X18 RFD (DISPOSABLE) ×3 IMPLANT
SUT MNCRL AB 4-0 PS2 18 (SUTURE) ×3 IMPLANT
SUT VICRYL 3-0 CR8 SH (SUTURE) ×3 IMPLANT
SYR CONTROL 10ML LL (SYRINGE) ×5 IMPLANT
TOWEL OR 17X24 6PK STRL BLUE (TOWEL DISPOSABLE) ×3 IMPLANT
TOWEL OR NON WOVEN STRL DISP B (DISPOSABLE) ×3 IMPLANT
TUBE CONNECTING 20'X1/4 (TUBING) ×1
TUBE CONNECTING 20X1/4 (TUBING) ×2 IMPLANT
YANKAUER SUCT BULB TIP NO VENT (SUCTIONS) ×3 IMPLANT

## 2017-07-22 NOTE — Op Note (Signed)
Preoperative diagnosis: Stage I right breast cancer  Postoperative diagnosis: Same  Procedure: Right breast seed localized partial mastectomy with right axillary deep sentinel lymph node mapping using methylene blue dye  Surgeon: Erroll Luna, MD  Anesthesia: General with pectoral block and local  EBL: 30 cc  Specimen: Right breast mass with seed and clip verified by Faxitron and excision of all margins sent separately with 3 right axillary sentinel lymph nodes 1 blue and hot the other 2 just hot from level 1 deep axillary node basin  Drains: None  IV fluids: Per anesthesia record  Indications for procedure: The patient is a 67 year old female with clinical stage I right breast cancer.  She opted for breast conservation after discussion of surgical options and meeting with medical and radiation oncology preoperatively.  Risk, benefits and other treatment options were discussed with the patient as well as complications of surgery.The procedure has been discussed with the patient. Alternatives to surgery have been discussed with the patient.  Risks of surgery include bleeding,  Infection,  Seroma formation, death,  and the need for further surgery.   The patient understands and wishes to proceed.Sentinel lymph node mapping and dissection has been discussed with the patient.  Risk of bleeding,  Infection,  Seroma formation,  Additional procedures,,  Shoulder weakness ,  Shoulder stiffness,  Nerve and blood vessel injury and reaction to the mapping dyes have been discussed.  Alternatives to surgery have been discussed with the patient.  The patient agrees to proceed.   Description of procedure: The patient was met in the holding area.  Questions were answered.  Neoprobe was used to verify the seed activity and location.  She underwent pectoral block by anesthesia and injection of sestamibi technetium sulfur colloid into her right breast.  She was taken back to the operating room.  She was placed  supine.  After induction of general anesthesia, right breast was prepped and draped in sterile fashion timeout was done to verify proper patient, procedure and side.  She received preoperative antibiotics.  4 cc of methylene blue dye were injected in a sub-areolar position and massaged for 5 minutes.  Neoprobe was used to localize seed location.  This was on iodine settings.  See was localized right breast upper outer quadrant.  Curvilinear incision was made over this.  With the help with the neoprobe dissection was carried down around the seed and clip and mass which was quite ill-defined.  The mass was excised and Faxitron image revealed both seed and clip to be in the specimen.  Due to the ill-defined nature of the mass, shave margins of all margins were taken and sent separately after orienting with ink.  Hemostasis was achieved.  Clips were placed to mark the cavity.  The cavity was closed with 3-0 Vicryl and 4-0 Monocryl.  The neoprobe settings were changed to technetium.  She had a very weak signal in the right axilla.  Dissection was begun with an incision in the inferior hairline of the right axilla with dissection carried into the level 1 content area of the right axilla.  Identified a blue and mildly hot sentinel node and then 2 others which were mildly hot as well.  Once these 3 were removed background counts approaches 0.  There are no other grossly enlarged or abnormal axillary nodes.  Hemostasis was achieved with clips as well as cautery.  A small piece of Surgicel snow was placed into the deep axillary region.  Irrigation was done was clear.  The long thoracic nerve, axillary vein and thoracodorsal trunks were preserved as well as the medial pectoral nerve.  The cavity was closed with 3-0 Vicryl and 4-0 Monocryl.  Dermabond applied to both incisions.  Breast binder placed.  All final counts were found to be correct.  The patient was awoke extubated taken recovery in satisfactory condition.

## 2017-07-22 NOTE — Anesthesia Procedure Notes (Signed)
Procedure Name: LMA Insertion Performed by: Guillaume Weninger W, CRNA Pre-anesthesia Checklist: Patient identified, Emergency Drugs available, Suction available and Patient being monitored Patient Re-evaluated:Patient Re-evaluated prior to induction Oxygen Delivery Method: Circle system utilized Preoxygenation: Pre-oxygenation with 100% oxygen Induction Type: IV induction Ventilation: Mask ventilation without difficulty LMA: LMA inserted LMA Size: 4.0 Number of attempts: 1 Placement Confirmation: positive ETCO2 Tube secured with: Tape Dental Injury: Teeth and Oropharynx as per pre-operative assessment        

## 2017-07-22 NOTE — Interval H&P Note (Signed)
History and Physical Interval Note:  07/22/2017 8:22 AM  Katelyn Porter  has presented today for surgery, with the diagnosis of RIGHT BREAST CANCER  The various methods of treatment have been discussed with the patient and family. After consideration of risks, benefits and other options for treatment, the patient has consented to  Procedure(s): BREAST LUMPECTOMY WITH RADIOACTIVE SEED AND SENTINEL LYMPH NODE BIOPSY (Right) as a surgical intervention .  The patient's history has been reviewed, patient examined, no change in status, stable for surgery.  I have reviewed the patient's chart and labs.  Questions were answered to the patient's satisfaction.     [place]

## 2017-07-22 NOTE — Progress Notes (Signed)
Assisted Dr. Germeroth with right, ultrasound guided, pectoralis block. Side rails up, monitors on throughout procedure. See vital signs in flow sheet. Tolerated Procedure well. 

## 2017-07-22 NOTE — Anesthesia Procedure Notes (Signed)
Anesthesia Regional Block: Pectoralis block   Pre-Anesthetic Checklist: ,, timeout performed, Correct Patient, Correct Site, Correct Laterality, Correct Procedure, Correct Position, site marked, Risks and benefits discussed,  Surgical consent,  Pre-op evaluation,  At surgeon's request and post-op pain management  Laterality: Right  Prep: chloraprep       Needles:  Injection technique: Single-shot  Needle Type: Stimiplex     Needle Length: 9cm      Additional Needles:   Procedures:,,,, ultrasound used (permanent image in chart),,,,  Narrative:  Start time: 07/22/2017 8:00 AM End time: 07/22/2017 8:06 AM Injection made incrementally with aspirations every 5 mL.  Performed by: Personally  Anesthesiologist: Nolon Nations, MD  Additional Notes: Patient tolerated the procedure well without complications

## 2017-07-22 NOTE — Anesthesia Postprocedure Evaluation (Signed)
Anesthesia Post Note  Patient: Katelyn Porter  Procedure(s) Performed: BREAST LUMPECTOMY WITH RADIOACTIVE SEED AND SENTINEL LYMPH NODE BIOPSY (Right Breast)     Patient location during evaluation: PACU Anesthesia Type: General Level of consciousness: sedated and patient cooperative Pain management: pain level controlled Vital Signs Assessment: post-procedure vital signs reviewed and stable Respiratory status: spontaneous breathing Cardiovascular status: stable Anesthetic complications: no    Last Vitals:  Vitals:   07/22/17 1130 07/22/17 1200  BP: 138/62 (!) 144/67  Pulse: 78 78  Resp: 17 16  Temp:  36.6 C  SpO2: 95% 96%    Last Pain:  Vitals:   07/22/17 1215  TempSrc:   PainSc: 2                  Nolon Nations

## 2017-07-22 NOTE — Anesthesia Preprocedure Evaluation (Signed)
Anesthesia Evaluation  Patient identified by MRN, date of birth, ID band Patient awake    Reviewed: Allergy & Precautions, NPO status , Patient's Chart, lab work & pertinent test results  Airway Mallampati: II  TM Distance: >3 FB Neck ROM: Full    Dental no notable dental hx.    Pulmonary neg pulmonary ROS,    Pulmonary exam normal breath sounds clear to auscultation       Cardiovascular hypertension, Pt. on medications + Peripheral Vascular Disease  Normal cardiovascular exam Rhythm:Regular Rate:Normal     Neuro/Psych PSYCHIATRIC DISORDERS Depression negative neurological ROS     GI/Hepatic Neg liver ROS, GERD  Medicated,  Endo/Other  negative endocrine ROS  Renal/GU negative Renal ROS     Musculoskeletal negative musculoskeletal ROS (+)   Abdominal   Peds  Hematology negative hematology ROS (+)   Anesthesia Other Findings   Reproductive/Obstetrics negative OB ROS                             Anesthesia Physical Anesthesia Plan  ASA: II  Anesthesia Plan: General   Post-op Pain Management:  Regional for Post-op pain   Induction: Intravenous  PONV Risk Score and Plan: 4 or greater and Ondansetron, Dexamethasone, Midazolam and Scopolamine patch - Pre-op  Airway Management Planned: LMA  Additional Equipment:   Intra-op Plan:   Post-operative Plan: Extubation in OR  Informed Consent: I have reviewed the patients History and Physical, chart, labs and discussed the procedure including the risks, benefits and alternatives for the proposed anesthesia with the patient or authorized representative who has indicated his/her understanding and acceptance.   Dental advisory given  Plan Discussed with: CRNA  Anesthesia Plan Comments:         Anesthesia Quick Evaluation

## 2017-07-22 NOTE — Discharge Instructions (Signed)
[place] Office Phone Number [phone]  BREAST BIOPSY/ PARTIAL MASTECTOMY: POST OP INSTRUCTIONS  Always review your discharge instruction sheet given to you by the facility where your surgery was performed.  IF YOU HAVE DISABILITY OR FAMILY LEAVE FORMS, YOU MUST BRING THEM TO THE OFFICE FOR PROCESSING.  DO NOT GIVE THEM TO YOUR DOCTOR.  1. A prescription for pain medication may be given to you upon discharge.  Take your pain medication as prescribed, if needed.  If narcotic pain medicine is not needed, then you may take acetaminophen (Tylenol) or ibuprofen (Advil) as needed. You had 1000 mg Tylenol at 7:40AM 2. Take your usually prescribed medications unless otherwise directed 3. If you need a refill on your pain medication, please contact your pharmacy.  They will contact our office to request authorization.  Prescriptions will not be filled after 5pm or on week-ends. 4. You should eat very light the first 24 hours after surgery, such as soup, crackers, pudding, etc.  Resume your normal diet the day after surgery. 5. Most patients will experience some swelling and bruising in the breast.  Ice packs and a good support bra will help.  Swelling and bruising can take several days to resolve.  6. It is common to experience some constipation if taking pain medication after surgery.  Increasing fluid intake and taking a stool softener will usually help or prevent this problem from occurring.  A mild laxative (Milk of Magnesia or Miralax) should be taken according to package directions if there are no bowel movements after 48 hours. 7. Unless discharge instructions indicate otherwise, you may remove your bandages 24-48 hours after surgery, and you may shower at that time.  You may have steri-strips (small skin tapes) in place directly over the incision.  These strips should be left on the skin for 7-10 days.  If your surgeon used skin glue on the incision, you may shower in 24 hours.   The glue will flake off over the next 2-3 weeks.  Any sutures or staples will be removed at the office during your follow-up visit. 8. ACTIVITIES:  You may resume regular daily activities (gradually increasing) beginning the next day.  Wearing a good support bra or sports bra minimizes pain and swelling.  You may have sexual intercourse when it is comfortable. a. You may drive when you no longer are taking prescription pain medication, you can comfortably wear a seatbelt, and you can safely maneuver your car and apply brakes. b. RETURN TO WORK:  ______________________________________________________________________________________ 9. You should see your doctor in the office for a follow-up appointment approximately two weeks after your surgery.  Your doctors nurse will typically make your follow-up appointment when she calls you with your pathology report.  Expect your pathology report 2-3 business days after your surgery.  You may call to check if you do not hear from [place] after three days. 10. OTHER INSTRUCTIONS: _______________________________________________________________________________________________ _____________________________________________________________________________________________________________________________________ _____________________________________________________________________________________________________________________________________ _____________________________________________________________________________________________________________________________________  WHEN TO CALL YOUR DOCTOR: 1. Fever over 101.0 2. Nausea and/or vomiting. 3. Extreme swelling or bruising. 4. Continued bleeding from incision. 5. Increased pain, redness, or drainage from the incision.  The clinic staff is available to answer your questions during regular business hours.  Please dont hesitate to call and ask to speak to one of the nurses for clinical concerns.  If you have a medical  emergency, go to the nearest emergency room or call 911.  A surgeon from [facility] is always on call at the hospital.  For further questions, please visit [email or web address]    Post Anesthesia Home Care Instructions  Activity: Get plenty of rest for the remainder of the day. A responsible individual must stay with you for 24 hours following the procedure.  For the next 24 hours, DO NOT: -Drive a car -Paediatric nurse -Drink alcoholic beverages -Take any medication unless instructed by your physician -Make any legal decisions or sign important papers.  Meals: Start with liquid foods such as gelatin or soup. Progress to regular foods as tolerated. Avoid greasy, spicy, heavy foods. If nausea and/or vomiting occur, drink only clear liquids until the nausea and/or vomiting subsides. Call your physician if vomiting continues.  Special Instructions/Symptoms: Your throat may feel dry or sore from the anesthesia or the breathing tube placed in your throat during surgery. If this causes discomfort, gargle with warm salt water. The discomfort should disappear within 24 hours.  If you had a scopolamine patch placed behind your ear for the management of post- operative nausea and/or vomiting:  1. The medication in the patch is effective for 72 hours, after which it should be removed.  Wrap patch in a tissue and discard in the trash. Wash hands thoroughly with soap and water. 2. You may remove the patch earlier than 72 hours if you experience unpleasant side effects which may include dry mouth, dizziness or visual disturbances. 3. Avoid touching the patch. Wash your hands with soap and water after contact with the patch.

## 2017-07-22 NOTE — Transfer of Care (Signed)
Immediate Anesthesia Transfer of Care Note  Patient: Katelyn Porter  Procedure(s) Performed: BREAST LUMPECTOMY WITH RADIOACTIVE SEED AND SENTINEL LYMPH NODE BIOPSY (Right Breast)  Patient Location: PACU  Anesthesia Type:General  Level of Consciousness: awake and sedated  Airway & Oxygen Therapy: Patient Spontanous Breathing and Patient connected to face mask oxygen  Post-op Assessment: Report given to RN and Post -op Vital signs reviewed and stable  Post vital signs: Reviewed and stable  Last Vitals:  Vitals Value Taken Time  BP 110/62 07/22/2017  9:54 AM  Temp    Pulse 84 07/22/2017  9:56 AM  Resp 14 07/22/2017  9:56 AM  SpO2 100 % 07/22/2017  9:56 AM  Vitals shown include unvalidated device data.  Last Pain:  Vitals:   07/22/17 0732  TempSrc: Oral  PainSc: 0-No pain         Complications: No apparent anesthesia complications

## 2017-07-23 ENCOUNTER — Other Ambulatory Visit: Payer: Self-pay | Admitting: Internal Medicine

## 2017-07-23 ENCOUNTER — Encounter (HOSPITAL_BASED_OUTPATIENT_CLINIC_OR_DEPARTMENT_OTHER): Payer: Self-pay | Admitting: Surgery

## 2017-08-05 ENCOUNTER — Ambulatory Visit
Admission: RE | Admit: 2017-08-05 | Discharge: 2017-08-05 | Disposition: A | Discharge status: Home / Self Care | Payer: Medicare Other | Source: Ambulatory Visit | Attending: Radiation Oncology | Admitting: Radiation Oncology

## 2017-08-05 ENCOUNTER — Other Ambulatory Visit: Payer: Self-pay

## 2017-08-05 ENCOUNTER — Encounter: Payer: Self-pay | Admitting: Radiation Oncology

## 2017-08-05 VITALS — BP 140/68 | HR 90 | Temp 98.8°F | Resp 20 | Ht 64.0 in | Wt 210.6 lb

## 2017-08-05 DIAGNOSIS — C50411 Malignant neoplasm of upper-outer quadrant of right female breast: Secondary | ICD-10-CM

## 2017-08-05 DIAGNOSIS — Z170 Estrogen receptor positive status [ER+]: Principal | ICD-10-CM

## 2017-08-05 NOTE — Progress Notes (Signed)
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 Breast, lumpectomy, right - INVASIVE DUCTAL CARCINOMA WITH EXTRACELLULAR MUCIN, 0.8 CM, MSBR GRADE II. - MARGINS NOT INVOLVED. - TUMOR FOCALLY 0.1 CM FROM ANTERIOR MARGIN. - PREVIOUS BIOPSY SITE AND BIOPSY CLIP. 2. Breast, excision, right additional anterior margin - BENIGN BREAST TISSUE WITH MILD FIBROCYSTIC CHANGES. - NO EVIDENCE OF MALIGNANCY. - FINAL ANTERIOR MARGIN CLEAR. 3. Breast, excision, right additional posterior margin - BENIGN BREAST TISSUE WITH MILD FIBROCYSTIC CHANGES. - NO EVIDENCE OF MALIGNANCY. - FINAL POSTERIOR MARGIN CLEAR. 4. Breast, excision, right additional lateral margin - BENIGN BREAST TISSUE WITH MILD FIBROCYSTIC CHANGES. - NO EVIDENCE OF MALIGNANCY. - FINAL LATERAL MARGIN CLEAR. 5. Breast, excision, right additional medial margin - BENIGN BREAST TISSUE WITH MILD FIBROCYSTIC CHANGES. - NO EVIDENCE OF MALIGNANCY. - FINAL MEDIAL MARGIN CLEAR. 6. Breast, excision, right additional superior margin - BENIGN BREAST TISSUE WITH MILD FIBROCYSTIC CHANGES. - NO EVIDENCE OF MALIGNANCY. - FINAL SUPERIOR MARGIN CLEAR. 7. Breast, excision, right additional inferior margin - BENIGN BREAST TISSUE WITH MILD FIBROCYSTIC CHANGES. - NO EVIDENCE OF MALIGNANCY. - FINAL INFERIOR MARGIN CLEAR. 8. Lymph node, sentinel, biopsy, right axillary - ONE BENIGN LYMPH NODE (0/1). 9. Lymph node, sentinel, biopsy, right - ONE BENIGN LYMPH NODE (0/1). 10. Lymph node, sentinel, biopsy, right - ONE BENIGN LYMPH NODE (0/1).  Receptor Status: ER(100%), PR (5%), Her2-neu (negative), Ki-(20%)  Did patient present with symptoms (if so, please note symptoms) or was this found on screening mammography?:   06/16/2017: Patient had  routine screening mammography showing a possible abnormality in the right breast. She underwentunilateralright breastdiagnostic mammography with tomography and right breast ultrasonography at [facility] on04/10/2017 showingbreast density category B. Suspicious new mass in the right breast9 o'clock central and 4 cm from the nipple measuring 0.8 x 0.6 x 0.7 cm. There was no axillary adenopathy sonographically.  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 --Dr. Marjie Skiff has requested an Oncotype from the initial biopsysample.If that suggests benefit from chemotherapy she will have a port placed at the time of her definitive surgery --Received oncotype score of 25/12%. Physician team notified. Called pt and discuss results and that as of now she does not need chemotherapy.  Lymphedema issues, if any:  None  Pain issues, if any: None  SAFETY ISSUES:  Prior radiation? Adjuvantradiation (for left side) completed May 2008  Pacemaker/ICD? No  Possible current pregnancy? No  Is the patient on methotrexate? No  Current Complaints / other details:   Katelyn Porter") Steenson presents today for follow up regarding radiation to the right upper outer quadrant breast. Reports fatigue and trouble finding enjoyment out of her normal daily activities. Struggles with diet and "eating the right foods". Since surgery she has had moderate pain and difficulty  with range of motion with the right arm. She feels overwhelmed trying  to care for herself and her husband. He was recently discharged from a SNF after suffering a stroke, and appears to have early onset dementia. Her sister helps occasionally, and there is a caretaker who comes when her husband goes for dialysis, but otherwise everything falls to her. She would like help from social work and a Automotive engineer.

## 2017-08-05 NOTE — Progress Notes (Signed)
Radiation Oncology         (336) [phone] ________________________________  Name: Katelyn Porter MRN: [id number]  Date: 08/05/2017  DOB: [birth year]  Re-Evaluation Visit Note  CC: [name], DO  Magrinat, Virgie Dad, MD    ICD-10-CM   1. Malignant neoplasm of upper-outer quadrant of right breast in female, estrogen receptor positive ([place]) C50.411 Ambulatory referral to Social Work   Z17.0     Diagnosis:   67 y.o. 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 Shelby Dubin from 06/07/2006-07/21/2006: The left breast was treated to 45 Gy in 25 fractions, followed by a 16 Gy boost, to yield a total dose of 61 Gy.   Narrative:  The patient returns today for re-evaluation of her right breast cancer. Since initial consultation, she underwent right breast lumpectomy with sentinel lymph node biopsy by Dr. [name] on 07/22/17. Final pathology revealed grade 2 invasive ductal carcinoma with extracellular mucin, measuring 0.8 cm. This was ER 100%, PR 5%, Her2 negative, and Ki 20%. Margins not involved; tumor focally 0.1 cm from anterior margin. 3 sentinel lymph nodes also negative.    The patient did have Oncotype DX testing which showed low risk for distant recurrence at 12% with hormonal therapy and therefore, adjuvant chemotherapy is not recommended. The absolute chemotherapy benefit was less than 1%.  On review of systems, the patient reports fatigue. She reports moderate right breast/axilla pain and decreased range of motion in her right shoulder since her surgery. The pain is relieved with ice and ibuprofen. She also takes one hydrocodone at night to manage the pain while she sleeps. She also reports some redness and swelling to her right forearm from a wasp  sting yesterday. She has follow-up with Dr. [name] tomorrow.                  ALLERGIES:  is allergic to crab [shellfish allergy]; sulfa antibiotics; lexapro [escitalopram oxalate]; lipitor [atorvastatin]; and sulfa drugs cross reactors.  Meds: Current Outpatient Medications  Medication Sig Dispense Refill  . B Complex-C-Folic Acid (STRESS 924 B-COMPLEX) TABS Take by mouth.    . DULoxetine (CYMBALTA) 60 MG capsule Take 60 mg by mouth daily.    Marland Kitchen HYDROcodone-acetaminophen (NORCO/VICODIN) 5-325 MG tablet Take 1 tablet by mouth every 6 (six) hours as needed for moderate pain. 15 tablet 0  . ibuprofen (ADVIL,MOTRIN) 800 MG tablet Take 1 tablet (800 mg total) by mouth every 8 (eight) hours as needed. 30 tablet 0  . Multiple Vitamins-Minerals (ONE-A-DAY WOMENS 50 PLUS PO) Take 1 tablet by mouth daily.    . pantoprazole (PROTONIX) 40 MG tablet TAKE 1 TABLET BY MOUTH ONCE DAILY 90 tablet 1  . pravastatin (PRAVACHOL) 40 MG tablet Take 1 tablet (40 mg total) by mouth daily. 30 tablet 5  . triamterene-hydrochlorothiazide (DYAZIDE) 37.5-25 MG capsule Take by mouth.    . Vitamin D, Ergocalciferol, (DRISDOL) 50000 units CAPS capsule Take 50,000 Units by mouth once a week.     No current facility-administered medications for this encounter.     Physical Findings: The patient is in no acute distress. Patient is alert and oriented.  height is '5\' 4"'  (1.626 m) and weight is 210 lb 9.6 oz (95.5 kg). Her oral temperature is 98.8 F (37.1 C). Her blood pressure  is 140/68 and her pulse is 90. Her respiration is 20 and oxygen saturation is 99%.   Lungs are clear to auscultation bilaterally. Heart has regular rate and rhythm. No palpable cervical, supraclavicular, or axillary adenopathy. Abdomen soft, non-tender, normal bowel sounds. She has some erythema to her right forearm from recent wasp sting with mild swelling.  Breast Exam Right Breast: Well-healing scar in the lateral aspect of the breast at  approximately 8:30 position. Scar in axillary region that is healing well. Left Breast: Well-healed lumpectomy scar. Mild hyperpigmentation changes.  Lab Findings: Lab Results  Component Value Date   WBC 6.5 06/29/2017   HGB 14.7 06/29/2017   HCT 43.6 06/29/2017   MCV 89.3 06/29/2017   PLT 296 06/29/2017    Radiographic Findings: Nm Sentinel Node Inj-no Rpt (breast)  Result Date: 07/22/2017 Sulfur colloid was injected by the nuclear medicine technologist for melanoma sentinel node.   Mm Breast Surgical Specimen  Result Date: 07/22/2017 CLINICAL DATA:  Evaluate specimen EXAM: SPECIMEN RADIOGRAPH OF THE RIGHT BREAST COMPARISON:  Previous exam(s). FINDINGS: Status post excision of the right breast. The radioactive seed and biopsy marker clip are present, completely intact, and were marked for pathology. IMPRESSION: Specimen radiograph of the right breast. Electronically Signed   By: [name]   On: 07/22/2017 09:21   Mm Rt Radioactive Seed Loc Mammo Guide  Result Date: 07/21/2017 CLINICAL DATA:  67 year old patient recently diagnosed with right breast cancer. Radioactive seed localization was requested. EXAM: MAMMOGRAPHIC GUIDED RADIOACTIVE SEED LOCALIZATION OF THE RIGHT BREAST COMPARISON:  Previous exam(s). FINDINGS: Patient presents for radioactive seed localization prior to lumpectomy. I met with the patient and we discussed the procedure of seed localization including benefits and alternatives. We discussed the high likelihood of a successful procedure. We discussed the risks of the procedure including infection, bleeding, tissue injury and further surgery. We discussed the low dose of radioactivity involved in the procedure. Informed, written consent was given. The usual time-out protocol was performed immediately prior to the procedure. Using mammographic guidance, sterile technique, 1% lidocaine and an I-125 radioactive seed, the mass and associated ribbon shaped biopsy clip  were localized using a lateral to medial approach. The follow-up mammogram images confirm the seed in the expected location and were marked for Dr. [name]. Follow-up survey of the patient confirms presence of the radioactive seed. Order number of I-125 seed:  [id number]. Total activity:  0.251 mCi reference Date: 29 June 2017 The patient tolerated the procedure well and was released from the [place]. She was given instructions regarding seed removal. IMPRESSION: Radioactive seed localization right breast. No apparent complications. Electronically Signed   By: Curlene Dolphin M.D.   On: 07/21/2017 14:22    Impression:  Stage IA, pT1bN0,invasive ductal carcinoma of the right breast, with extracellular mucin; grade 2; estrogen and progesterone receptor positive, HER-2 negative, Ki-67 20%.  Patient would be a good candidate for adjuvant radiation therapy directed to the right breast. Given potential overlap over sternum area with previous radiation therapy,  I would not recommended hypofractionated treatment. We discussed the general course of radiation, potential side effects, and toxicities with radiation with the patient. The patient is aware of these in light of previous radiation therapy and would like to proceed with this course of treatment. We discussed and signed the radiation oncology consent form, and a copy was retained for our records.  Plan:  Patient will be set up for CT simulation approximately 5 weeks out from her surgery  with treatments to begin approximately 6 weeks post-op. I anticipate between 6 and 6.5 weeks of radiation therapy. Patient will see Dr. [name] tomorrow for her post-op visit. Patient will let us know if the erythema on the right forearm from her wasp sting worsens or does not improve. She will discuss this area with Dr. [name] tomorrow as it relates to her recent surgery.   ____________________________________  Blair Promise, PhD, MD  This document serves as a  record of services personally performed by Gery Pray, MD. It was created on his behalf by Rae Lips, a trained medical scribe. The creation of this record is based on the scribe's personal observations and the provider's statements to them. This document has been checked and approved by the attending provider.

## 2017-08-06 ENCOUNTER — Encounter: Payer: Self-pay | Admitting: General Practice

## 2017-08-06 NOTE — Progress Notes (Signed)
[facility] Spiritual Care Note  Referred by Willodean Rosenthal, Dr Clabe Seal nurse, for spiritual and emotional support regarding personal and caregiver stressors. Reached her briefly by phone. We plan to f/u in more detail by phone next week.   [place], [place], [facility] Pager [phone] Voicemail [phone]

## 2017-08-06 NOTE — Progress Notes (Signed)
[facility] Psychosocial Distress Screening Clinical Social Work  Clinical Social Work was referred by distress screening protocol.  The patient scored a 7 on the Psychosocial Distress Thermometer which indicates moderate distress. Clinical Social Worker Edwyna Shell to assess for distress and other psychosocial needs. Is currently caring for huband at home, insurance pays for PT, OT and nursing care through skilled home health.  Husband attends dialysis Mon/Wed/Fri.  She has hired help for [employer] as  Husband cannot be left on his own and this is financially stressful.  Patient continues to work, likes her job and job has been accommodating to her treatment schedule needs.  "I try to keep a positive attitude."  CSW will mail information on caregiver support options and [facility] in order to assist w difficulties of managing husband's care at home.  Encouraged patient to reach out when radiation is scheduled if she finds herself unable to drive herself to appointments.  Encouraged patient to call as needed for support and resources.  ONCBCN DISTRESS SCREENING 08/05/2017  Screening Type Change in Status  Distress experienced in past week (1-10) 7  Practical problem type Food  Family Problem type Partner  Emotional problem type Adjusting to illness  Information Concerns Type Lack of info about maintaining fitness  Physical Problem type Pain;Sleep/insomnia    Clinical Social Worker follow up needed: No.  If yes, follow up plan:   Edwyna Shell, LCSW Clinical Social Worker Phone:  [phone]

## 2017-08-10 ENCOUNTER — Encounter: Payer: Self-pay | Admitting: General Practice

## 2017-08-10 NOTE — Progress Notes (Signed)
[facility] Spiritual Care Note  Followed up with Katelyn Porter by phone, establishing pastoral relationship. She was very welcoming of [name] empathic listening and emotional support, sharing about caregiver stress with husband at home, satisfaction with her job, emotional support from her sister, and some caregiver support from pt's dtr. Reflected back strengths including self-advocacy and planning, including intention to discuss best fit for antidepressants with her PCP in order to maximize coping tools during this stressful time.  We set up a f/u appt in my office after her appt with Dr [name] on 6/24, and [name] plans to call sooner if need arises.   [name], [place], [facility] Pager [phone] Voicemail [phone]

## 2017-08-12 DIAGNOSIS — E559 Vitamin D deficiency, unspecified: Secondary | ICD-10-CM

## 2017-08-12 HISTORY — DX: Vitamin D deficiency, unspecified: E55.9

## 2017-08-13 ENCOUNTER — Telehealth: Payer: Self-pay | Admitting: Oncology

## 2017-08-13 NOTE — Telephone Encounter (Signed)
Scheduled appt pe r5/29 schmessage - pt aware of appt date and time.

## 2017-08-23 ENCOUNTER — Ambulatory Visit
Admission: RE | Admit: 2017-08-23 | Discharge: 2017-08-23 | Disposition: A | Discharge status: Home / Self Care | Payer: Medicare Other | Source: Ambulatory Visit | Attending: Radiation Oncology | Admitting: Radiation Oncology

## 2017-08-23 DIAGNOSIS — Z510 Encounter for antineoplastic radiation therapy: Secondary | ICD-10-CM | POA: Insufficient documentation

## 2017-08-23 DIAGNOSIS — Z170 Estrogen receptor positive status [ER+]: Secondary | ICD-10-CM | POA: Insufficient documentation

## 2017-08-23 DIAGNOSIS — C50411 Malignant neoplasm of upper-outer quadrant of right female breast: Secondary | ICD-10-CM | POA: Diagnosis not present

## 2017-08-23 NOTE — Progress Notes (Signed)
  Radiation Oncology         [phone] ________________________________  Name: Katelyn Porter MRN: [id number]  Date: 08/23/2017  DOB: 06-Mar-1951  SIMULATION AND TREATMENT PLANNING NOTE    ICD-10-CM   1. Malignant neoplasm of upper-outer quadrant of right breast in female, estrogen receptor positive ([place]) C50.411    Z17.0     DIAGNOSIS:  66 y.o. female with Stage IA, pT1bN0,invasive ductal carcinoma of the right breast, with extracellular mucin; grade 2; estrogen and progesterone receptor positive, HER-2 negative  NARRATIVE:  The patient was brought to the [facility].  Identity was confirmed.  All relevant records and images related to the planned course of therapy were reviewed.  The patient freely provided informed written consent to proceed with treatment after reviewing the details related to the planned course of therapy. The consent form was witnessed and verified by the simulation staff.  Then, the patient was set-up in a stable reproducible  supine position for radiation therapy.  CT images were obtained.  Surface markings were placed.  The CT images were loaded into the planning software.  Then the target and avoidance structures were contoured.  Treatment planning then occurred.  The radiation prescription was entered and confirmed.  Then, I designed and supervised the construction of a total of 3 medically necessary complex treatment devices.  I have requested : 3D Simulation  I have requested a DVH of the following structures: heart, lungs and lumpectomy cavoty.  I have ordered:dose calc.  PLAN:  The patient will receive 50.4 Gy in 28 fractions. Followed by a boost to the lumpectomy cavity of 10 Gy in 5 fractions.    Optical Surface Tracking Plan:  Since intensity modulated radiotherapy (IMRT) and 3D conformal radiation treatment methods are predicated on accurate and precise positioning for treatment, intrafraction motion monitoring is medically necessary to  ensure accurate and safe treatment delivery.  The ability to quantify intrafraction motion without excessive ionizing radiation dose can only be performed with optical surface tracking. Accordingly, surface imaging offers the opportunity to obtain 3D measurements of patient position throughout IMRT and 3D treatments without excessive radiation exposure.  I am ordering optical surface tracking for this patient's upcoming course of radiotherapy. ________________________________     -----------------------------------  Blair Promise, PhD, MD   This document serves as a record of services personally performed by Blair Promise, PhD, MD. It was created on his behalf by Margit Banda, a trained medical scribe. The creation of this record is based on the scribe's personal observations and the provider's statements to them. This document has been checked and approved by the attending provider.

## 2017-08-24 ENCOUNTER — Ambulatory Visit (AMBULATORY_SURGERY_CENTER): Payer: Self-pay | Admitting: *Deleted

## 2017-08-24 ENCOUNTER — Other Ambulatory Visit: Payer: Self-pay

## 2017-08-24 VITALS — Ht 64.0 in | Wt 206.0 lb

## 2017-08-24 DIAGNOSIS — Z800 Family history of malignant neoplasm of digestive organs: Secondary | ICD-10-CM

## 2017-08-24 NOTE — Progress Notes (Signed)
No egg or soy allergy known to patient  No issues with past sedation with any surgeries  or procedures, no intubation problems Pt. States that sometimes she wakes up during the procedure No diet pills per patient No home 02 use per patient  No blood thinners per patient  Pt denies issues with constipation  No A fib or A flutter  EMMI video sent to pt's e mail

## 2017-08-25 DIAGNOSIS — Z510 Encounter for antineoplastic radiation therapy: Secondary | ICD-10-CM | POA: Diagnosis not present

## 2017-08-30 ENCOUNTER — Inpatient Hospital Stay: Payer: Medicare Other | Attending: Oncology | Admitting: Nutrition

## 2017-08-30 ENCOUNTER — Ambulatory Visit
Admission: RE | Admit: 2017-08-30 | Discharge: 2017-08-30 | Disposition: A | Discharge status: Home / Self Care | Payer: Medicare Other | Source: Ambulatory Visit | Attending: Radiation Oncology | Admitting: Radiation Oncology

## 2017-08-30 DIAGNOSIS — Z170 Estrogen receptor positive status [ER+]: Secondary | ICD-10-CM | POA: Insufficient documentation

## 2017-08-30 DIAGNOSIS — C50411 Malignant neoplasm of upper-outer quadrant of right female breast: Secondary | ICD-10-CM | POA: Insufficient documentation

## 2017-08-30 NOTE — Progress Notes (Signed)
67 year old female diagnosed with breast cancer.  She is to receive radiation therapy only.  Past medical history includes IBS, hypertension, hyperlipidemia, GERD, and depression.  Medications include vitamin B complex, multivitamin, and Protonix.  Labs include glucose 108, BUN 22, and creatinine 1.04 on April 16.  Height: 64 inches. Weight: 206 pounds. Usual body weight: 214 pounds in January 2019. BMI: 35.36.  Patient reports she has been trying to eat more vegetables and fruits in the hopes of losing weight. She states weight loss is reflective of decreased calories. Patient requesting general information about eating during breast cancer treatment.  Nutrition diagnosis: Food and nutrition related knowledge deficit related to breast cancer and associated treatments as evidenced by no prior need for nutrition related information.  Intervention: Patient was educated to increase plant-based foods and to consume adequate protein for healing. Support patient's desire to try to eat healthier foods for slow, safe weight loss. Educated patient on the importance of lean protein sources during treatment for healing. I provided fact sheet on protein foods. Encourage patient to reduce sweetened drinks. Questions were answered teach back method used.  Contact information was given.  Monitoring, evaluation, goals: Patient will tolerate adequate calories and protein for weight maintenance/safe weight loss.  No follow-up required.  Nutrition diagnosis resolved.  **Disclaimer: This note was dictated with voice recognition software. Similar sounding words can inadvertently be transcribed and this note may contain transcription errors which may not have been corrected upon publication of note.**

## 2017-08-31 ENCOUNTER — Ambulatory Visit
Admission: RE | Admit: 2017-08-31 | Discharge: 2017-08-31 | Disposition: A | Discharge status: Home / Self Care | Payer: Medicare Other | Source: Ambulatory Visit | Attending: Radiation Oncology | Admitting: Radiation Oncology

## 2017-08-31 DIAGNOSIS — C50411 Malignant neoplasm of upper-outer quadrant of right female breast: Secondary | ICD-10-CM

## 2017-08-31 DIAGNOSIS — Z510 Encounter for antineoplastic radiation therapy: Secondary | ICD-10-CM | POA: Diagnosis not present

## 2017-08-31 DIAGNOSIS — Z170 Estrogen receptor positive status [ER+]: Principal | ICD-10-CM

## 2017-08-31 MED ORDER — RADIAPLEXRX EX GEL
Freq: Once | CUTANEOUS | Status: AC
  Administered 2017-08-31: 16:00:00 via TOPICAL

## 2017-08-31 MED ORDER — ALRA NON-METALLIC DEODORANT (RAD-ONC)
1.0000 "application " | Freq: Once | TOPICAL | Status: AC
  Administered 2017-08-31: 1 via TOPICAL

## 2017-08-31 NOTE — Progress Notes (Signed)
Pt here for patient teaching.  Pt given Radiation and You booklet, skin care instructions, Alra deodorant and Radiaplex gel.  Reviewed areas of pertinence such as fatigue, hair loss, skin changes, breast tenderness and breast swelling . Pt able to give teach back of to pat skin and use unscented/gentle soap,apply Radiaplex bid, avoid applying anything to skin within 4 hours of treatment, avoid wearing an under wire bra and to use an electric razor if they must shave. Pt demonstrated understanding, needs reinforcement, no evidence of learning, refused teaching and  of information given and will contact nursing with any questions or concerns.     [email or web address]      

## 2017-09-01 ENCOUNTER — Ambulatory Visit
Admission: RE | Admit: 2017-09-01 | Discharge: 2017-09-01 | Disposition: A | Discharge status: Home / Self Care | Payer: Medicare Other | Source: Ambulatory Visit | Attending: Radiation Oncology | Admitting: Radiation Oncology

## 2017-09-01 DIAGNOSIS — Z510 Encounter for antineoplastic radiation therapy: Secondary | ICD-10-CM | POA: Diagnosis not present

## 2017-09-02 ENCOUNTER — Ambulatory Visit
Admission: RE | Admit: 2017-09-02 | Discharge: 2017-09-02 | Disposition: A | Discharge status: Home / Self Care | Payer: Medicare Other | Source: Ambulatory Visit | Attending: Radiation Oncology | Admitting: Radiation Oncology

## 2017-09-02 DIAGNOSIS — Z510 Encounter for antineoplastic radiation therapy: Secondary | ICD-10-CM | POA: Diagnosis not present

## 2017-09-03 ENCOUNTER — Ambulatory Visit
Admission: RE | Admit: 2017-09-03 | Discharge: 2017-09-03 | Disposition: A | Discharge status: Home / Self Care | Payer: Medicare Other | Source: Ambulatory Visit | Attending: Radiation Oncology | Admitting: Radiation Oncology

## 2017-09-03 DIAGNOSIS — Z510 Encounter for antineoplastic radiation therapy: Secondary | ICD-10-CM | POA: Diagnosis not present

## 2017-09-05 NOTE — Progress Notes (Signed)
Katelyn Porter  [phone] Fax:([phone]) [phone]     ID: Katelyn Porter DOB: 05/11/1950  MR#: [id number]  VJK#:[id number]  Patient Care Team: [name], DO as PCP - General (Internal Medicine) Megan Salon, MD as Attending Physician (Obstetrics and Gynecology) Magrinat, Virgie Dad, MD as Consulting Physician (Oncology) Erroll Luna, MD as Consulting Physician (General Surgery) Gery Pray, MD as Consulting Physician (Radiation Oncology) OTHER MD:  CHIEF COMPLAINT: Estrogen receptor positive breast cancer  CURRENT TREATMENT: adjuvant radiation   HISTORY OF CURRENT ILLNESS: From the original intake note:  Katelyn Porter has a 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  She was treated with surgery radiation and tamoxifen for 5 years and we released her from follow-up in 2013.  More recently she had routine screening mammography, on 06/16/2017 showing a possible abnormality in the right breast. She underwent unilateral right breast diagnostic mammography with tomography and right breast ultrasonography at [facility] on 06/21/2017 showing breast density category B. Suspicious new mass in the right breast 9 o'clock central and 4 cm from the nipple measuring 0.8 x 0.6 x 0.7 cm. There was no axillary adenopathy sonographically.   Accordingly on 06/22/2017 she proceeded to biopsy of the right breast area in question. The pathology from this procedure showed ([id number]): Breast, right, needle core biopsy, 9:15 o'clock, UOQ with invasive ductal carcinoma with extracellular mucin, grade 2. Prognostic indicators significant for: estrogen receptor, 100% positive and progesterone receptor, 5% positive, both with strong staining intensity. Proliferation marker Ki67 at 20%. HER2 negative.  The patient's subsequent history is as detailed below.  INTERVAL HISTORY: Katelyn Porter returns today for follow up and treatment of her estrogen receptor  positive breast cancer. She is currently undergoing adjuvant radiation treatment. She feels very tired and she hasn't been sleeping well at night.   Since her last visit, she underwent right lumpectomy and right axillary sentinel lymph node sampling ([id number]) on 07/22/2017 with pathology showing: Invasive ductal carcinoma grade II, with extracellular mucin spanning 0.8 cm. Negative margins. Tumor focally 0.1 cm from the anterior margin, but no evidence of malignancy. Three right axillary sentinel lymph nodes are negative for malignancy (0/3)  She also completed genetics testing on was evaluated in the breast cancer clinic on 06/29/2017. 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  There were also 2 variants of uncertain significance.  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 She is considering lessening her work load. She denies unusual headaches, visual changes, nausea, vomiting, or dizziness. There has been no unusual cough, phlegm production, or pleurisy. This been no change in bowel or bladder habits. She denies unexplained fatigue or unexplained weight loss, bleeding, rash, or fever. A detailed review of systems was otherwise stable.    PAST MEDICAL HISTORY: Past Medical History:  Diagnosis Date  . Breast cancer ([place]) 2008   left, with radiation finished 08, tamoxifen  . Breast cancer ([place]) 2019   Rt. had lymph node removed  having radiation currently  . Complication of anesthesia    woke up with colonoscopy and hysterectomy  . Depression 01/24/2013  . Family history of breast cancer   . Family history of colon cancer   . GERD (gastroesophageal reflux disease)   . Hyperlipidemia   . Hypertension   . IBS (irritable bowel syndrome)   . Palpitations   . Vertigo     PAST SURGICAL HISTORY: Past Surgical History:  Procedure Laterality Date  .  ABDOMINAL HYSTERECTOMY  1985   TAH (ovaries remain)  . BREAST LUMPECTOMY Left 2007  .  BREAST LUMPECTOMY WITH RADIOACTIVE SEED AND SENTINEL LYMPH NODE BIOPSY Right 07/22/2017   Procedure: BREAST LUMPECTOMY WITH RADIOACTIVE SEED AND SENTINEL LYMPH NODE BIOPSY;  Surgeon: Erroll Luna, MD;  Location: [facility];  Service: General;  Laterality: Right;  . COLONOSCOPY    . TONSILLECTOMY     age 62    FAMILY HISTORY Family History  Problem Relation Age of Onset  . Lung cancer Father        Died at 40 from lung cancer  . Heart disease Father        heart attack  . Colon cancer Mother 21  . Hypertension Mother   . Kidney disease Mother   . Diabetes Mother   . Hypertension Sister   . Diabetes Sister   . Colon polyps Sister   . Breast cancer Maternal Aunt        dx in her 24s  . Coronary artery disease Brother 33       died of "massive heart attack"  . Diabetes Brother   . Colon cancer Cousin        maternal 1st cousin dx over 45  . Ovarian cancer Paternal Grandmother   . Breast cancer Cousin        mat 1st cousin dx in her 76s  . Esophageal cancer Neg Hx   . Stomach cancer Neg Hx   . Rectal cancer Neg Hx   The patient's father had a history of tongue cancer which apparently spread to his lungs according to the patient. He passed due to a MI at age 27. The patient's mother died at age 47 due to renal failure and diabetes.  She also had a history of colon cancer. The patient had 1 brother who died from an MI; and 1 sister.  There was a maternal aunt diagnosed with breast cancer in the 68's. A maternal cousin passed due to breast cancer. Another maternal cousin had colon cancer. She denies a family history of ovarian cancer.   GYNECOLOGIC HISTORY:  Patient's last menstrual period was 03/17/1983. Menarche: 67 years old. She is GXP0. Her LMP was age 54 after her hysterectomy with BSO. She took HRT until 2008, discontinued at the time of her initial breast cancer diagnosis.    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 Her  husband, Marjory Lies, worked in [employer] but is retired. He has 2 children from a prior marriage, a daughter Zigmund Daniel, who lives in [place] and works for [employer], and a son, Personnel officer, who works for the [employer] in [place], [place]. The patient has 3 step-grandchildren. She attends [employer].     ADVANCED DIRECTIVES:    HEALTH MAINTENANCE: Social History   Tobacco Use  . Smoking status: Never Smoker  . Smokeless tobacco: Never Used  Substance Use Topics  . Alcohol use: No  . Drug use: No     Colonoscopy: UTD/Dr. Perry/ normal  PAP: s/p TAHBSO  Bone density:   Allergies  Allergen Reactions  . Crab [Shellfish Allergy] Swelling    Blisters on tongue.  . Sulfa Antibiotics Other (See Comments)    headache  . Lexapro [Escitalopram Oxalate] Other (See Comments)    drowsiness  . Lipitor [Atorvastatin]     Muscle cramping  . Sulfa Drugs Cross Reactors     Current Outpatient Medications  Medication  Sig Dispense Refill  . Artificial Tear Solution (AKWA TEARS RENEWED OP) Place 1 drop into both eyes daily as needed.    . Cholecalciferol (VITAMIN D3) 1000 units CAPS Take 1 capsule by mouth daily.    . DULoxetine (CYMBALTA) 60 MG capsule Take 60 mg by mouth daily.    . Multiple Vitamins-Minerals (ONE-A-DAY WOMENS 50 PLUS PO) Take 1 tablet by mouth daily.    . pantoprazole (PROTONIX) 40 MG tablet TAKE 1 TABLET BY MOUTH ONCE DAILY 90 tablet 1  . PEG-KCl-NaCl-NaSulf-Na Asc-C (PLENVU) 140 g SOLR Take by mouth. Take as directed per colonoscopy instructions  Sample given   Lot  [id number]  Exp.  09/2018  Plenvu    . pravastatin (PRAVACHOL) 40 MG tablet Take 1 tablet (40 mg total) by mouth daily. 30 tablet 5  . triamterene-hydrochlorothiazide (DYAZIDE) 37.5-25 MG capsule Take by mouth.     No current facility-administered medications for this visit.     OBJECTIVE: Middle-aged African-American woman who appears fatigued Vitals:   09/06/17 1448  BP: (!) 141/57    Pulse: 68  Resp: 18  Temp: 98.8 F (37.1 C)  SpO2: 100%     Body mass index is 35.87 kg/m.   Wt Readings from Last 3 Encounters:  09/06/17 209 lb (94.8 kg)  08/24/17 206 lb (93.4 kg)  08/05/17 210 lb 9.6 oz (95.5 kg)      ECOG FS:1 - Symptomatic but completely ambulatory  Sclerae unicteric, EOMs intact Oropharynx clear and moist No cervical or supraclavicular adenopathy Lungs no rales or rhonchi Heart regular rate and rhythm Abd soft, nontender, positive bowel sounds MSK no focal spinal tenderness, no upper extremity lymphedema Neuro: nonfocal, well oriented, tired affect Breasts: The right breast is status post biopsy and is currently receiving radiation.  There is minimal hyperpigmentation.  The cosmetic result is good.  The left breast is status post remote lumpectomy and radiation.  There is no evidence of local recurrence.  Both axillae are benign.  LAB RESULTS:  CMP     Component Value Date/Time   NA 139 06/29/2017 1520   K 4.1 06/29/2017 1520   CL 103 06/29/2017 1520   CO2 25 06/29/2017 1520   GLUCOSE 108 (H) 06/29/2017 1520   BUN 22 (H) 06/29/2017 1520   CREATININE 1.04 (H) 06/29/2017 1520   CREATININE 0.94 04/13/2013 1411   CALCIUM 10.2 06/29/2017 1520   PROT 7.7 06/29/2017 1520   ALBUMIN 4.4 06/29/2017 1520   AST 19 06/29/2017 1520   ALT 23 06/29/2017 1520   ALKPHOS 79 06/29/2017 1520   BILITOT 0.7 06/29/2017 1520   GFRNONAA 55 (L) 06/29/2017 1520   GFRNONAA 68 01/24/2013 0942   GFRAA >60 06/29/2017 1520   GFRAA 79 01/24/2013 0942    No results found for: TOTALPROTELP, ALBUMINELP, A1GS, A2GS, BETS, BETA2SER, GAMS, MSPIKE, SPEI  No results found for: KPAFRELGTCHN, LAMBDASER, KAPLAMBRATIO  Lab Results  Component Value Date   WBC 6.5 06/29/2017   NEUTROABS 3.2 06/29/2017   HGB 14.7 06/29/2017   HCT 43.6 06/29/2017   MCV 89.3 06/29/2017   PLT 296 06/29/2017    '@LASTCHEMISTRY' @  Lab Results  Component Value Date   LABCA2 30 07/28/2010     No components found for: RCBULA453  No results for input(s): INR in the last 168 hours.  Lab Results  Component Value Date   LABCA2 30 07/28/2010    No results found for: MIW803  No results found for: OZY248  No results found for: GNO037  No results found for: CA2729  No components found for: HGQUANT  No results found for: CEA1 / No results found for: CEA1   No results found for: AFPTUMOR  No results found for: CHROMOGRNA  No results found for: PSA1  No visits with results within 3 Day(s) from this visit.  Latest known visit with results is:  Office Visit on 06/29/2017  Component Date Value Ref Range Status  . Sodium 06/29/2017 139  135 - 145 mmol/L Final  . Potassium 06/29/2017 4.1  3.5 - 5.1 mmol/L Final  . Chloride 06/29/2017 103  101 - 111 mmol/L Final  . CO2 06/29/2017 25  22 - 32 mmol/L Final  . Glucose, Bld 06/29/2017 108* 65 - 99 mg/dL Final  . BUN 06/29/2017 22* 6 - 20 mg/dL Final  . Creatinine, Ser 06/29/2017 1.04* 0.44 - 1.00 mg/dL Final  . Calcium 06/29/2017 10.2  8.9 - 10.3 mg/dL Final  . Total Protein 06/29/2017 7.7  6.5 - 8.1 g/dL Final  . Albumin 06/29/2017 4.4  3.5 - 5.0 g/dL Final  . AST 06/29/2017 19  15 - 41 U/L Final  . ALT 06/29/2017 23  14 - 54 U/L Final  . Alkaline Phosphatase 06/29/2017 79  38 - 126 U/L Final  . Total Bilirubin 06/29/2017 0.7  0.3 - 1.2 mg/dL Final  . GFR calc non Af Amer 06/29/2017 55* >60 mL/min Final  . GFR calc Af Amer 06/29/2017 >60  >60 mL/min Final   Comment: (NOTE) The eGFR has been calculated using the CKD EPI equation. This calculation has not been validated in all clinical situations. eGFR's persistently <60 mL/min signify possible Chronic Kidney Disease.   Georgiann Hahn gap 06/29/2017 11  5 - 15 Final   Performed at [facility], [address] [place], [place] [address]    (this displays the last labs from the last 3 days)  No results found for: TOTALPROTELP, ALBUMINELP, A1GS, A2GS,  BETS, BETA2SER, GAMS, MSPIKE, SPEI (this displays SPEP labs)  No results found for: KPAFRELGTCHN, LAMBDASER, KAPLAMBRATIO (kappa/lambda light chains)  No results found for: HGBA, HGBA2QUANT, HGBFQUANT, HGBSQUAN (Hemoglobinopathy evaluation)   No results found for: LDH  No results found for: IRON, TIBC, IRONPCTSAT (Iron and TIBC)  No results found for: FERRITIN  Urinalysis    Component Value Date/Time   COLORURINE YELLOW 02/24/2016 1906   APPEARANCEUR CLEAR 02/24/2016 1906   LABSPEC 1.021 02/24/2016 1906   PHURINE 6.0 02/24/2016 1906   GLUCOSEU NEGATIVE 02/24/2016 1906   HGBUR NEGATIVE 02/24/2016 1906   BILIRUBINUR NEGATIVE 02/24/2016 1906   BILIRUBINUR n 04/13/2013 1318   [place] 02/24/2016 1906   PROTEINUR NEGATIVE 02/24/2016 1906   UROBILINOGEN negative 04/13/2013 1318   NITRITE NEGATIVE 02/24/2016 1906   LEUKOCYTESUR NEGATIVE 02/24/2016 1906     STUDIES: No results found.  ELIGIBLE FOR AVAILABLE RESEARCH PROTOCOL: no  ASSESSMENT: 67 y.o. [place] , [place] woman status post left lumpectomy February 2008 for a grade 3 ductal carcinoma in situ which was strongly estrogen and progesterone receptor positive.  (a)   adjuvant radiation completed May 2008  (b)  on tamoxifen July 2008 through March 2013.  (1) status post right breast upper outer quadrant biopsy 06/22/2017 for a clinnical T1b N0, stage IA invasive ductal carcinoma, with extracellular mucin; grade 2; estrogen and progesterone receptor positive, HER-2 not amplified, with an MIB-1 of 20%.  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 The following genes were evaluated for sequence changes only: HOXB13*, NTHL1*, SDHA   (3) Oncotype obtained from the 06/22/2017 biopsy showed a score of 25, predicting a risk of recurrence outside the breast over the next 9 years of 12% if the patient took antiestrogens for 5 years.  It also shows no benefit from chemotherapy.  (4) status post right lumpectomy and sentinel lymph node sampling 07/22/2017 for a pT1b pN0, stage Ia invasive ductal carcinoma, grade 2, with extracellular mucin, and negative margins.  (a) a total of 3 lymph nodes were removed  (5) adjuvant radiation to be completed 10/15/2017  (6) to start anastrozole 11/14/2017  (7) intensified screening: The patient will have yearly breast MRI October, and yearly mammography April   PLAN: Katelyn Porter is tolerating radiation generally well except for fatigue.  Partly this may be due to insomnia as well.  At any rate she is falling asleep just waiting for the doctor or start checking in, and I think it might be helpful if she could work part-time through August.  Hopefully by September she will be picking up some steam.  Today we reviewed her Oncotype results which are very favorable assuming she takes an antiestrogen for 5 years.  Since she previously took tamoxifen, it would make more sense if she took anastrozole this time.  We reviewed the possible toxicity side effects and complications of anastrozole in detail.  She will started November 14, 2017, a few weeks after completing her radiation treatment.  We also reviewed the results of her genetics testing.  She has a good understanding of the options of bilateral mastectomies versus intensified screening.  She very much prefers the intensified screening option and I have set her up for a breast MRI in October of this year.  She will return to see me in  November.  She knows to call for any problems that may develop before the next visit.  Magrinat, Virgie Dad, MD  09/06/17 3:19 PM Medical Oncology and Hematology [facility] [address] [place], [place] [address] Tel. [phone]    Fax. [phone]  Alice Rieger, am acting as scribe for Chauncey Cruel MD.  I, Lurline Del MD, have reviewed the above documentation for accuracy and completeness, and I agree with the above.   ADDENDUM: From the genetics note dated 07/21/2017: The ATM gene is involved in the detection and surveillance of DNA damage.  ATM phosphorylation of BRCA1 is critical for proper response to DNA double-strand breaks.  This is believed to be the reason for the role ATM has in breast cancer risk.  Individuals with a ATM mutation are at a greater risk for having children with Ataxia-telangiectasia (A-T). AT is characterized by progressive cerebellar degeneration (ataxia), dilated blood vessels in the eyes and skin (telangiectasia), immunodeficiency, chromosomal instability, increased sensitivity to ionizing radiation and a predisposition to lymphoma and leukemia.  Therefore, individuals of childbearing age who have a known ATM mutation may want to consider having their spouse tested to determine their risk for having a child with A-T.  Studies of these families demonstrated increased incident of breast  cancer in the mothers (who are heterozygous carriers) of the affected children, thus prompting further evaluation of the relationship between breast cancer and ATM.  Women who are heterozygous ATM carriers have an increase breast cancer risk.  They have 5-fold increased risk of breast cancer <50 years, and 2-3 fold increased risk for breast cancer overall.  In families with familial breast cancer that were negative for BRCA1 or BRCA2 genes, approximately 2.7% of women were found to have one ATM mutation.  In families with both breast cancer and  leukemia, 6.7% of women were found to have one ATM mutation.   Management for individuals with ATM mutations can be found in the NCCN guidelines (v.3.2019). These guidelines recommend the following:  Breast Cancer   Screening: Annual mammogram with consideration of tomosynthesis and consider breast MRI with contrast starting at age 49 years.  Risk Reducing Mastectomy: Evidence insufficient, consider based on family history  Ovarian Cancer  No increased risk for ovarian cancer, therefore no recommendations  Other Cancer Risks  Unknown or insufficient evidence for pancreatic or prostate cancer risk

## 2017-09-06 ENCOUNTER — Telehealth: Payer: Self-pay | Admitting: Oncology

## 2017-09-06 ENCOUNTER — Inpatient Hospital Stay (HOSPITAL_BASED_OUTPATIENT_CLINIC_OR_DEPARTMENT_OTHER): Payer: Medicare Other | Admitting: Oncology

## 2017-09-06 ENCOUNTER — Ambulatory Visit
Admission: RE | Admit: 2017-09-06 | Discharge: 2017-09-06 | Disposition: A | Discharge status: Home / Self Care | Payer: Medicare Other | Source: Ambulatory Visit | Attending: Radiation Oncology | Admitting: Radiation Oncology

## 2017-09-06 VITALS — BP 141/57 | HR 68 | Temp 98.8°F | Resp 18 | Ht 64.0 in | Wt 209.0 lb

## 2017-09-06 DIAGNOSIS — D0512 Intraductal carcinoma in situ of left breast: Secondary | ICD-10-CM

## 2017-09-06 DIAGNOSIS — C50411 Malignant neoplasm of upper-outer quadrant of right female breast: Secondary | ICD-10-CM

## 2017-09-06 DIAGNOSIS — Z510 Encounter for antineoplastic radiation therapy: Secondary | ICD-10-CM | POA: Diagnosis not present

## 2017-09-06 DIAGNOSIS — C50912 Malignant neoplasm of unspecified site of left female breast: Secondary | ICD-10-CM

## 2017-09-06 DIAGNOSIS — Z170 Estrogen receptor positive status [ER+]: Secondary | ICD-10-CM

## 2017-09-06 DIAGNOSIS — Z1379 Encounter for other screening for genetic and chromosomal anomalies: Secondary | ICD-10-CM

## 2017-09-06 MED ORDER — ANASTROZOLE 1 MG PO TABS: 1.0000 mg | ORAL_TABLET | Freq: Every day | ORAL | 4 refills | Status: DC

## 2017-09-06 NOTE — Telephone Encounter (Signed)
Schedule mailed.  °

## 2017-09-06 NOTE — Telephone Encounter (Signed)
Left message re November lab/fu. [facility] will contact patient re mri. Also provided contact information for patient to f/u with [facility] if she does not hear from them.

## 2017-09-07 ENCOUNTER — Ambulatory Visit (AMBULATORY_SURGERY_CENTER): Payer: Medicare Other | Admitting: Internal Medicine

## 2017-09-07 ENCOUNTER — Encounter: Payer: Self-pay | Admitting: Internal Medicine

## 2017-09-07 ENCOUNTER — Ambulatory Visit
Admission: RE | Admit: 2017-09-07 | Discharge: 2017-09-07 | Disposition: A | Discharge status: Home / Self Care | Payer: Medicare Other | Source: Ambulatory Visit | Attending: Radiation Oncology | Admitting: Radiation Oncology

## 2017-09-07 ENCOUNTER — Other Ambulatory Visit: Payer: Self-pay

## 2017-09-07 VITALS — BP 160/55 | HR 56 | Temp 98.4°F | Resp 14 | Ht 64.0 in | Wt 206.0 lb

## 2017-09-07 DIAGNOSIS — D125 Benign neoplasm of sigmoid colon: Secondary | ICD-10-CM | POA: Diagnosis not present

## 2017-09-07 DIAGNOSIS — Z1211 Encounter for screening for malignant neoplasm of colon: Secondary | ICD-10-CM

## 2017-09-07 DIAGNOSIS — D122 Benign neoplasm of ascending colon: Secondary | ICD-10-CM

## 2017-09-07 DIAGNOSIS — Z510 Encounter for antineoplastic radiation therapy: Secondary | ICD-10-CM | POA: Diagnosis not present

## 2017-09-07 DIAGNOSIS — Z800 Family history of malignant neoplasm of digestive organs: Secondary | ICD-10-CM | POA: Diagnosis present

## 2017-09-07 DIAGNOSIS — D123 Benign neoplasm of transverse colon: Secondary | ICD-10-CM | POA: Diagnosis not present

## 2017-09-07 DIAGNOSIS — D120 Benign neoplasm of cecum: Secondary | ICD-10-CM | POA: Diagnosis not present

## 2017-09-07 MED ORDER — SODIUM CHLORIDE 0.9 % IV SOLN: 500.0000 mL | Freq: Once | INTRAVENOUS | Status: DC

## 2017-09-07 NOTE — Op Note (Signed)
[facility] Patient Name: Katelyn Porter Procedure Date: 09/07/2017 9:41 AM MRN: [id number] Endoscopist: Docia Chuck. Henrene Pastor , MD Age: 67 Referring MD:  Date of Birth: 1950/04/25 Gender: Female Account #: [id number] Procedure:                Colonoscopy, With cold snare polypectomy x 3; And                            hot snare polypectomy x 1 Indications:              Screening in patient at increased risk: Colorectal                            cancer in mother 53 or older. Niece with colon                            cancer. Previous examination elsewhere 2012                            reported as normal Medicines:                Monitored Anesthesia Care Procedure:                Pre-Anesthesia Assessment:                           - Prior to the procedure, a History and Physical                            was performed, and patient medications and                            allergies were reviewed. The patient's tolerance of                            previous anesthesia was also reviewed. The risks                            and benefits of the procedure and the sedation                            options and risks were discussed with the patient.                            All questions were answered, and informed consent                            was obtained. Prior Anticoagulants: The patient has                            taken no previous anticoagulant or antiplatelet                            agents. ASA Grade Assessment: II - A patient with  mild systemic disease. After reviewing the risks                            and benefits, the patient was deemed in                            satisfactory condition to undergo the procedure.                           After obtaining informed consent, the colonoscope                            was passed under direct vision. Throughout the                            procedure, the patient's blood pressure,  pulse, and                            oxygen saturations were monitored continuously. The                            Colonoscope was introduced through the anus and                            advanced to the the cecum, identified by                            appendiceal orifice and ileocecal valve. The                            ileocecal valve, appendiceal orifice, and rectum                            were photographed. The quality of the bowel                            preparation was excellent. The colonoscopy was                            performed without difficulty. The patient tolerated                            the procedure well. The bowel preparation used was                            SUPREP. Scope In: 9:48:18 AM Scope Out: 10:14:40 AM Scope Withdrawal Time: 0 hours 23 minutes 25 seconds  Total Procedure Duration: 0 hours 26 minutes 22 seconds  Findings:                 A 15 mm polyp was found in the sigmoid colon At 20                            cm. The polyp was pedunculated. The polyp was  removed with a hot snare In 2 pieces. Resection and                            retrieval were complete.                           Three polyps were found in the transverse colon,                            ascending colon and cecum. The polyps were 2 to 8                            mm in size. These polyps were removed with a cold                            snare. Resection and retrieval were complete.                           Multiple small-mouthed diverticula were found in                            the sigmoid colon.                           The exam was otherwise without abnormality on                            direct and retroflexion views. Complications:            No immediate complications. Estimated blood loss:                            None. Estimated Blood Loss:     Estimated blood loss: none. Impression:               - One 15 mm polyp in  the sigmoid colon, removed                            with a hot snare. Resected and retrieved.                           - Three 2 to 8 mm polyps in the transverse colon,                            in the ascending colon and in the cecum, removed                            with a cold snare. Resected and retrieved.                           - Diverticulosis in the sigmoid colon.                           - The examination was otherwise normal on direct  and retroflexion views. Recommendation:           - Repeat colonoscopy in 3 years for surveillance.                           - Patient has a contact number available for                            emergencies. The signs and symptoms of potential                            delayed complications were discussed with the                            patient. Return to normal activities tomorrow.                            Written discharge instructions were provided to the                            patient.                           - Resume previous diet.                           - Continue present medications.                           - Await pathology results. Docia Chuck. Henrene Pastor, MD 09/07/2017 10:25:21 AM This report has been signed electronically.

## 2017-09-07 NOTE — Patient Instructions (Signed)
YOU HAD AN ENDOSCOPIC PROCEDURE TODAY AT [facility]:   Refer to the procedure report that was given to you for any specific questions about what was found during the examination.  If the procedure report does not answer your questions, please call your gastroenterologist to clarify.  If you requested that your care partner not be given the details of your procedure findings, then the procedure report has been included in a sealed envelope for you to review at your convenience later.  YOU SHOULD EXPECT: Some feelings of bloating in the abdomen. Passage of more gas than usual.  Walking can help get rid of the air that was put into your GI tract during the procedure and reduce the bloating. If you had a lower endoscopy (such as a colonoscopy or flexible sigmoidoscopy) you may notice spotting of blood in your stool or on the toilet paper. If you underwent a bowel prep for your procedure, you may not have a normal bowel movement for a few days.  Please Note:  You might notice some irritation and congestion in your nose or some drainage.  This is from the oxygen used during your procedure.  There is no need for concern and it should clear up in a day or so.  SYMPTOMS TO REPORT IMMEDIATELY:   Following lower endoscopy (colonoscopy or flexible sigmoidoscopy):  Excessive amounts of blood in the stool  Significant tenderness or worsening of abdominal pains  Swelling of the abdomen that is new, acute  Fever of 100F or higher   Following upper endoscopy (EGD)  Vomiting of blood or coffee ground material  New chest pain or pain under the shoulder blades  Painful or persistently difficult swallowing  New shortness of breath  Fever of 100F or higher  Black, tarry-looking stools  For urgent or emergent issues, a gastroenterologist can be reached at any hour by calling [phone].   DIET:  We do recommend a small meal at first, but then you may proceed to your regular diet.  Drink  plenty of fluids but you should avoid alcoholic beverages for 24 hours.  ACTIVITY:  You should plan to take it easy for the rest of today and you should NOT DRIVE or use heavy machinery until tomorrow (because of the sedation medicines used during the test).    FOLLOW UP: Our staff will call the number listed on your records the next business day following your procedure to check on you and address any questions or concerns that you may have regarding the information given to you following your procedure. If we do not reach you, we will leave a message.  However, if you are feeling well and you are not experiencing any problems, there is no need to return our call.  We will assume that you have returned to your regular daily activities without incident.  If any biopsies were taken you will be contacted by phone or by letter within the next 1-3 weeks.  Please call us at [phone] if you have not heard about the biopsies in 3 weeks.    SIGNATURES/CONFIDENTIALITY: You and/or your care partner have signed paperwork which will be entered into your electronic medical record.  These signatures attest to the fact that that the information above on your After Visit Summary has been reviewed and is understood.  Full responsibility of the confidentiality of this discharge information lies with you and/or your care-partner.  Polyp and diverticulosis information given.  Recall 3 years.

## 2017-09-07 NOTE — Progress Notes (Signed)
Pt's states no medical or surgical changes since previsit or office visit.above progress note entry entered in error ( called to room for pathology)

## 2017-09-07 NOTE — Progress Notes (Signed)
Spontaneous respirations throughout. VSS. Resting comfortably. To PACU on room air. Report to  RN. 

## 2017-09-07 NOTE — Progress Notes (Signed)
Called to room to assist during endoscopic procedure.  Patient ID and intended procedure confirmed with present staff. Received instructions for my participation in the procedure from the performing physician.  

## 2017-09-07 NOTE — Progress Notes (Deleted)
Pt. Here extended period because he was HIPPA and Dr. Henrene Pastor wanted to talk with him post procedure.  He was sleeping when Dr. Henrene Pastor visited immediately after procedure.  Dr. Henrene Pastor reviewed findings prior to discharge.

## 2017-09-08 ENCOUNTER — Telehealth: Payer: Self-pay

## 2017-09-08 ENCOUNTER — Ambulatory Visit
Admission: RE | Admit: 2017-09-08 | Discharge: 2017-09-08 | Disposition: A | Discharge status: Home / Self Care | Payer: Medicare Other | Source: Ambulatory Visit | Attending: Radiation Oncology | Admitting: Radiation Oncology

## 2017-09-08 DIAGNOSIS — Z510 Encounter for antineoplastic radiation therapy: Secondary | ICD-10-CM | POA: Diagnosis not present

## 2017-09-08 NOTE — Telephone Encounter (Signed)
No answer, left message for patient to follow up with [facility] if any issues or concerns with procedure. B.Lailee Hoelzel RN

## 2017-09-08 NOTE — Telephone Encounter (Signed)
Follow up call made, left a voicemail. 

## 2017-09-09 ENCOUNTER — Other Ambulatory Visit: Payer: Self-pay

## 2017-09-09 ENCOUNTER — Ambulatory Visit (INDEPENDENT_AMBULATORY_CARE_PROVIDER_SITE_OTHER): Payer: Medicare Other | Admitting: Obstetrics & Gynecology

## 2017-09-09 ENCOUNTER — Ambulatory Visit
Admission: RE | Admit: 2017-09-09 | Discharge: 2017-09-09 | Disposition: A | Discharge status: Home / Self Care | Payer: Medicare Other | Source: Ambulatory Visit | Attending: Radiation Oncology | Admitting: Radiation Oncology

## 2017-09-09 ENCOUNTER — Encounter: Payer: Self-pay | Admitting: Obstetrics & Gynecology

## 2017-09-09 ENCOUNTER — Other Ambulatory Visit: Payer: Self-pay | Admitting: *Deleted

## 2017-09-09 VITALS — BP 142/70 | HR 76 | Resp 14 | Ht 62.25 in | Wt 208.8 lb

## 2017-09-09 DIAGNOSIS — Z01419 Encounter for gynecological examination (general) (routine) without abnormal findings: Secondary | ICD-10-CM

## 2017-09-09 DIAGNOSIS — Z124 Encounter for screening for malignant neoplasm of cervix: Secondary | ICD-10-CM | POA: Diagnosis not present

## 2017-09-09 DIAGNOSIS — E2839 Other primary ovarian failure: Secondary | ICD-10-CM

## 2017-09-09 DIAGNOSIS — Z510 Encounter for antineoplastic radiation therapy: Secondary | ICD-10-CM | POA: Diagnosis not present

## 2017-09-09 MED ORDER — NITROFURANTOIN MONOHYD MACRO 100 MG PO CAPS: ORAL_CAPSULE | ORAL | 0 refills | Status: DC

## 2017-09-09 NOTE — Progress Notes (Signed)
67 y.o. G1P0 MarriedAfrican AmericanF here for annual exam.  Has been diagnosed with a second breast cancer.  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  Oncotype testing showed no benefit with chemotherapy.  Had lumpectomy with [facility].  Doing radiation for 6 weeks.    Denies vaginal bleeding.    Husband had a stroke and a subsequent hematoma after a fall.  Is currently in rehab.  His speech is slurred and using a walker.    PCP:  Dr. Suzy Bouchard.    Patient's last menstrual period was 03/17/1983.          Sexually active: No.  The current method of family planning is status post hysterectomy.    Exercising: No.   Smoker:  no  Health Maintenance: Pap:  2007 normal   History of abnormal Pap:  No MMG:  07/22/17 right lumpectomy - [facility] Colonoscopy: 09/07/17 polyps. F/u 3 years.  Doesn't have results yet.   BMD:   2007 TDaP:  06/12/14 Pneumonia vaccine(s): done  Shingrix:   No Hep C testing: 06/09/16 Neg  Screening Labs: PCP   reports that she has never smoked. She has never used smokeless tobacco. She reports that she does not drink alcohol or use drugs.  Past Medical History:  Diagnosis Date  . Breast cancer ([place]) 2008   left, with radiation finished 08, tamoxifen  . Breast cancer ([place]) 2019   Rt. had lymph node removed  having radiation currently  . Complication of anesthesia    woke up with colonoscopy and hysterectomy  . Depression 01/24/2013  . Family history of breast cancer   . Family history of colon cancer   . GERD (gastroesophageal reflux disease)   . Hyperlipidemia   . Hypertension   . IBS (irritable bowel syndrome)   . Palpitations   . Vertigo     Past Surgical History:  Procedure Laterality Date  . ABDOMINAL HYSTERECTOMY  1985   TAH (ovaries remain)  . BREAST LUMPECTOMY Left 2007  . BREAST LUMPECTOMY WITH RADIOACTIVE SEED AND SENTINEL LYMPH NODE BIOPSY  Right 07/22/2017   Procedure: BREAST LUMPECTOMY WITH RADIOACTIVE SEED AND SENTINEL LYMPH NODE BIOPSY;  Surgeon: Erroll Luna, MD;  Location: [facility];  Service: General;  Laterality: Right;  . COLONOSCOPY    . TONSILLECTOMY     age 67    Current Outpatient Medications  Medication Sig Dispense Refill  . Cholecalciferol (VITAMIN D3) 1000 units CAPS Take 1 capsule by mouth daily.    . DULoxetine (CYMBALTA) 60 MG capsule Take 60 mg by mouth daily.    . Multiple Vitamins-Minerals (ONE-A-DAY WOMENS 50 PLUS PO) Take 1 tablet by mouth daily.    . pantoprazole (PROTONIX) 40 MG tablet TAKE 1 TABLET BY MOUTH ONCE DAILY 90 tablet 1  . pravastatin (PRAVACHOL) 40 MG tablet Take 1 tablet (40 mg total) by mouth daily. 30 tablet 5  . triamterene-hydrochlorothiazide (DYAZIDE) 37.5-25 MG capsule Take by mouth.    Marland Kitchen anastrozole (ARIMIDEX) 1 MG tablet Take 1 tablet (1 mg total) by mouth daily. (Patient not taking: Reported on 09/07/2017) 90 tablet 4   Current Facility-Administered Medications  Medication Dose Route Frequency Provider Last Rate Last Dose  . 0.9 %  sodium chloride infusion  500 mL Intravenous Once Irene Shipper, MD        Family History  Problem Relation Age of Onset  . Lung cancer Father  Died at 1 from lung cancer  . Heart disease Father        heart attack  . Colon cancer Mother 20  . Hypertension Mother   . Kidney disease Mother   . Diabetes Mother   . Hypertension Sister   . Diabetes Sister   . Colon polyps Sister   . Breast cancer Maternal Aunt        dx in her 31s  . Coronary artery disease Brother 17       died of "massive heart attack"  . Diabetes Brother   . Colon cancer Cousin        maternal 1st cousin dx over 48  . Ovarian cancer Paternal Grandmother   . Breast cancer Cousin        mat 1st cousin dx in her 17s  . Esophageal cancer Neg Hx   . Stomach cancer Neg Hx   . Rectal cancer Neg Hx     Review of Systems  All other systems reviewed  and are negative.   Exam:   BP (!) 142/70 (BP Location: Left Arm, Patient Position: Sitting, Cuff Size: Large)   Pulse 76   Resp 14   Ht 5' 2.25" (1.581 m)   Wt 208 lb 12.8 oz (94.7 kg)   LMP 03/17/1983   BMI 37.88 kg/m   Height:   Height: 5' 2.25" (158.1 cm)  Ht Readings from Last 3 Encounters:  09/09/17 5' 2.25" (1.581 m)  09/07/17 '5\' 4"'  (1.626 m)  09/06/17 '5\' 4"'  (1.626 m)    General appearance: alert, cooperative and appears stated age Head: Normocephalic, without obvious abnormality, atraumatic Neck: no adenopathy, supple, symmetrical, trachea midline and thyroid normal to inspection and palpation Lungs: clear to auscultation bilaterally Breasts: markers for radiation on breast, scar on right healing well with radiation changes present, left breast with well healed scar and stable radiation change, no lump/LAD/new skin changes on left. Heart: regular rate and rhythm Abdomen: soft, non-tender; bowel sounds normal; no masses,  no organomegaly Extremities: extremities normal, atraumatic, no cyanosis or edema Skin: Skin color, texture, turgor normal. No rashes or lesions Lymph nodes: Cervical, supraclavicular, and axillary nodes normal. No abnormal inguinal nodes palpated Neurologic: Grossly normal   Pelvic: External genitalia:  no lesions              Urethra:  normal appearing urethra with no masses, tenderness or lesions              Bartholins and Skenes: normal                 Vagina: normal appearing vagina with normal color and discharge, no lesions              Cervix: absent              Pap taken: No. Bimanual Exam:  Uterus:  uterus absent              Adnexa: no mass, fullness, tenderness               Rectovaginal: Confirms               Anus:  normal sphincter tone, no lesions  Chaperone was present for exam.  A:  Well Woman with normal exam PMP, no HRT Breast cancer 2008 and then again this year.  Followed by Dr. [name].  Will start Aridimex after she  completed radiation. Hypertension H/o depression that is stable H/O elevated lipids H/o TAH  1085, ovaries remain  H/o recurrent UTIs  P:   Mammogram 3D and yearly MRI will be done after treatment is completed pap smear not indicated today RF for Nitrofurantion 137m bid x 3 days.  Rx to pharmacy to file rx if needed. Lab work planned with PCP Not interested in Shingrix vaccine right now. BMD order placed.  Pt will start Arimidex in September.  Should do BMD sometime this fall. return annually or prn

## 2017-09-10 ENCOUNTER — Ambulatory Visit
Admission: RE | Admit: 2017-09-10 | Discharge: 2017-09-10 | Disposition: A | Discharge status: Home / Self Care | Payer: Medicare Other | Source: Ambulatory Visit | Attending: Radiation Oncology | Admitting: Radiation Oncology

## 2017-09-10 DIAGNOSIS — Z510 Encounter for antineoplastic radiation therapy: Secondary | ICD-10-CM | POA: Diagnosis not present

## 2017-09-13 ENCOUNTER — Encounter: Payer: Self-pay | Admitting: Internal Medicine

## 2017-09-13 ENCOUNTER — Ambulatory Visit
Admission: RE | Admit: 2017-09-13 | Discharge: 2017-09-13 | Disposition: A | Discharge status: Home / Self Care | Payer: Medicare Other | Source: Ambulatory Visit | Attending: Radiation Oncology | Admitting: Radiation Oncology

## 2017-09-13 DIAGNOSIS — Z510 Encounter for antineoplastic radiation therapy: Secondary | ICD-10-CM | POA: Diagnosis present

## 2017-09-13 DIAGNOSIS — Z170 Estrogen receptor positive status [ER+]: Secondary | ICD-10-CM | POA: Insufficient documentation

## 2017-09-13 DIAGNOSIS — C50411 Malignant neoplasm of upper-outer quadrant of right female breast: Secondary | ICD-10-CM | POA: Diagnosis not present

## 2017-09-14 ENCOUNTER — Ambulatory Visit
Admission: RE | Admit: 2017-09-14 | Discharge: 2017-09-14 | Disposition: A | Discharge status: Home / Self Care | Payer: Medicare Other | Source: Ambulatory Visit | Attending: Radiation Oncology | Admitting: Radiation Oncology

## 2017-09-14 DIAGNOSIS — Z510 Encounter for antineoplastic radiation therapy: Secondary | ICD-10-CM | POA: Diagnosis not present

## 2017-09-15 ENCOUNTER — Ambulatory Visit
Admission: RE | Admit: 2017-09-15 | Discharge: 2017-09-15 | Disposition: A | Discharge status: Home / Self Care | Payer: Medicare Other | Source: Ambulatory Visit | Attending: Radiation Oncology | Admitting: Radiation Oncology

## 2017-09-15 DIAGNOSIS — Z510 Encounter for antineoplastic radiation therapy: Secondary | ICD-10-CM | POA: Diagnosis not present

## 2017-09-17 ENCOUNTER — Ambulatory Visit: Payer: Medicare Other

## 2017-09-20 ENCOUNTER — Ambulatory Visit
Admission: RE | Admit: 2017-09-20 | Discharge: 2017-09-20 | Disposition: A | Discharge status: Home / Self Care | Payer: Medicare Other | Source: Ambulatory Visit | Attending: Radiation Oncology | Admitting: Radiation Oncology

## 2017-09-20 DIAGNOSIS — Z510 Encounter for antineoplastic radiation therapy: Secondary | ICD-10-CM | POA: Diagnosis not present

## 2017-09-21 ENCOUNTER — Ambulatory Visit
Admission: RE | Admit: 2017-09-21 | Discharge: 2017-09-21 | Disposition: A | Discharge status: Home / Self Care | Payer: Medicare Other | Source: Ambulatory Visit | Attending: Radiation Oncology | Admitting: Radiation Oncology

## 2017-09-21 DIAGNOSIS — Z510 Encounter for antineoplastic radiation therapy: Secondary | ICD-10-CM | POA: Diagnosis not present

## 2017-09-22 ENCOUNTER — Ambulatory Visit
Admission: RE | Admit: 2017-09-22 | Discharge: 2017-09-22 | Disposition: A | Discharge status: Home / Self Care | Payer: Medicare Other | Source: Ambulatory Visit | Attending: Radiation Oncology | Admitting: Radiation Oncology

## 2017-09-22 DIAGNOSIS — Z510 Encounter for antineoplastic radiation therapy: Secondary | ICD-10-CM | POA: Diagnosis not present

## 2017-09-23 ENCOUNTER — Ambulatory Visit
Admission: RE | Admit: 2017-09-23 | Discharge: 2017-09-23 | Disposition: A | Discharge status: Home / Self Care | Payer: Medicare Other | Source: Ambulatory Visit | Attending: Radiation Oncology | Admitting: Radiation Oncology

## 2017-09-23 DIAGNOSIS — Z510 Encounter for antineoplastic radiation therapy: Secondary | ICD-10-CM | POA: Diagnosis not present

## 2017-09-24 ENCOUNTER — Ambulatory Visit
Admission: RE | Admit: 2017-09-24 | Discharge: 2017-09-24 | Disposition: A | Discharge status: Home / Self Care | Payer: Medicare Other | Source: Ambulatory Visit | Attending: Radiation Oncology | Admitting: Radiation Oncology

## 2017-09-24 DIAGNOSIS — Z510 Encounter for antineoplastic radiation therapy: Secondary | ICD-10-CM | POA: Diagnosis not present

## 2017-09-27 ENCOUNTER — Ambulatory Visit
Admission: RE | Admit: 2017-09-27 | Discharge: 2017-09-27 | Disposition: A | Discharge status: Home / Self Care | Payer: Medicare Other | Source: Ambulatory Visit | Attending: Radiation Oncology | Admitting: Radiation Oncology

## 2017-09-27 DIAGNOSIS — Z510 Encounter for antineoplastic radiation therapy: Secondary | ICD-10-CM | POA: Diagnosis not present

## 2017-09-28 ENCOUNTER — Ambulatory Visit
Admission: RE | Admit: 2017-09-28 | Discharge: 2017-09-28 | Disposition: A | Discharge status: Home / Self Care | Payer: Medicare Other | Source: Ambulatory Visit | Attending: Radiation Oncology | Admitting: Radiation Oncology

## 2017-09-28 DIAGNOSIS — Z510 Encounter for antineoplastic radiation therapy: Secondary | ICD-10-CM | POA: Diagnosis not present

## 2017-09-29 ENCOUNTER — Ambulatory Visit
Admission: RE | Admit: 2017-09-29 | Discharge: 2017-09-29 | Disposition: A | Discharge status: Home / Self Care | Payer: Medicare Other | Source: Ambulatory Visit | Attending: Radiation Oncology | Admitting: Radiation Oncology

## 2017-09-29 DIAGNOSIS — Z510 Encounter for antineoplastic radiation therapy: Secondary | ICD-10-CM | POA: Diagnosis not present

## 2017-09-30 ENCOUNTER — Emergency Department (HOSPITAL_COMMUNITY): Payer: Medicare Other

## 2017-09-30 ENCOUNTER — Encounter (HOSPITAL_COMMUNITY): Payer: Self-pay

## 2017-09-30 ENCOUNTER — Other Ambulatory Visit: Payer: Self-pay

## 2017-09-30 ENCOUNTER — Emergency Department (HOSPITAL_COMMUNITY)
Admission: EM | Admit: 2017-09-30 | Discharge: 2017-09-30 | Disposition: A | Discharge status: Home / Self Care | Payer: Medicare Other | Attending: Emergency Medicine | Admitting: Emergency Medicine

## 2017-09-30 ENCOUNTER — Ambulatory Visit: Admission: RE | Admit: 2017-09-30 | Payer: Medicare Other | Source: Ambulatory Visit

## 2017-09-30 DIAGNOSIS — Z79899 Other long term (current) drug therapy: Secondary | ICD-10-CM | POA: Insufficient documentation

## 2017-09-30 DIAGNOSIS — R51 Headache: Secondary | ICD-10-CM | POA: Insufficient documentation

## 2017-09-30 DIAGNOSIS — R42 Dizziness and giddiness: Secondary | ICD-10-CM | POA: Diagnosis not present

## 2017-09-30 DIAGNOSIS — I10 Essential (primary) hypertension: Secondary | ICD-10-CM | POA: Insufficient documentation

## 2017-09-30 LAB — CBC WITH DIFFERENTIAL/PLATELET
Basophils Absolute: 0 10*3/uL (ref 0.0–0.1)
Basophils Relative: 1 %
EOS ABS: 0.2 10*3/uL (ref 0.0–0.7)
EOS PCT: 3 %
HCT: 42.4 % (ref 36.0–46.0)
HEMOGLOBIN: 14.1 g/dL (ref 12.0–15.0)
LYMPHS ABS: 1.6 10*3/uL (ref 0.7–4.0)
Lymphocytes Relative: 32 %
MCH: 30.5 pg (ref 26.0–34.0)
MCHC: 33.3 g/dL (ref 30.0–36.0)
MCV: 91.6 fL (ref 78.0–100.0)
MONO ABS: 0.5 10*3/uL (ref 0.1–1.0)
MONOS PCT: 10 %
Neutro Abs: 2.7 10*3/uL (ref 1.7–7.7)
Neutrophils Relative %: 54 %
PLATELETS: 278 10*3/uL (ref 150–400)
RBC: 4.63 MIL/uL (ref 3.87–5.11)
RDW: 12.7 % (ref 11.5–15.5)
WBC: 4.9 10*3/uL (ref 4.0–10.5)

## 2017-09-30 LAB — URINALYSIS, ROUTINE W REFLEX MICROSCOPIC
Bilirubin Urine: NEGATIVE
GLUCOSE, UA: NEGATIVE mg/dL
HGB URINE DIPSTICK: NEGATIVE
KETONES UR: NEGATIVE mg/dL
Leukocytes, UA: NEGATIVE
Nitrite: NEGATIVE
PH: 8 (ref 5.0–8.0)
Protein, ur: NEGATIVE mg/dL
Specific Gravity, Urine: 1.004 — ABNORMAL LOW (ref 1.005–1.030)

## 2017-09-30 LAB — I-STAT TROPONIN, ED: TROPONIN I, POC: 0.01 ng/mL (ref 0.00–0.08)

## 2017-09-30 LAB — COMPREHENSIVE METABOLIC PANEL
ALT: 19 U/L (ref 0–44)
AST: 19 U/L (ref 15–41)
Albumin: 4.1 g/dL (ref 3.5–5.0)
Alkaline Phosphatase: 86 U/L (ref 38–126)
Anion gap: 7 (ref 5–15)
BILIRUBIN TOTAL: 0.7 mg/dL (ref 0.3–1.2)
BUN: 14 mg/dL (ref 8–23)
CO2: 29 mmol/L (ref 22–32)
Calcium: 9.7 mg/dL (ref 8.9–10.3)
Chloride: 109 mmol/L (ref 98–111)
Creatinine, Ser: 0.88 mg/dL (ref 0.44–1.00)
GFR calc Af Amer: >60 mL/min (ref >60)
Glucose, Bld: 120 mg/dL — ABNORMAL HIGH (ref 70–99)
POTASSIUM: 3.5 mmol/L (ref 3.5–5.1)
Sodium: 145 mmol/L (ref 135–145)
TOTAL PROTEIN: 7.3 g/dL (ref 6.5–8.1)

## 2017-09-30 LAB — CBG MONITORING, ED: Glucose-Capillary: 91 mg/dL (ref 70–99)

## 2017-09-30 MED ORDER — MECLIZINE HCL 12.5 MG PO TABS: 12.5000 mg | ORAL_TABLET | Freq: Three times a day (TID) | ORAL | 0 refills | Status: DC | PRN

## 2017-09-30 MED ORDER — PROMETHAZINE HCL 25 MG/ML IJ SOLN
12.5000 mg | Freq: Once | INTRAMUSCULAR | Status: AC
  Administered 2017-09-30: 12.5 mg via INTRAVENOUS
  Filled 2017-09-30: qty 1

## 2017-09-30 MED ORDER — HYDRALAZINE HCL 20 MG/ML IJ SOLN
5.0000 mg | Freq: Once | INTRAMUSCULAR | Status: AC
  Administered 2017-09-30: 5 mg via INTRAVENOUS

## 2017-09-30 MED ORDER — MECLIZINE HCL 25 MG PO TABS
25.0000 mg | ORAL_TABLET | Freq: Once | ORAL | Status: AC
  Administered 2017-09-30: 25 mg via ORAL

## 2017-09-30 MED ORDER — DEXAMETHASONE SODIUM PHOSPHATE 10 MG/ML IJ SOLN
4.0000 mg | Freq: Once | INTRAMUSCULAR | Status: AC
  Administered 2017-09-30: 4 mg via INTRAVENOUS

## 2017-09-30 MED ORDER — METOCLOPRAMIDE HCL 5 MG/ML IJ SOLN
10.0000 mg | Freq: Once | INTRAMUSCULAR | Status: AC
  Administered 2017-09-30: 10 mg via INTRAVENOUS
  Filled 2017-09-30: qty 2

## 2017-09-30 MED ORDER — KETOROLAC TROMETHAMINE 30 MG/ML IJ SOLN
30.0000 mg | Freq: Once | INTRAMUSCULAR | Status: AC
  Administered 2017-09-30: 30 mg via INTRAVENOUS
  Filled 2017-09-30: qty 1

## 2017-09-30 MED ORDER — DIPHENHYDRAMINE HCL 50 MG/ML IJ SOLN
12.5000 mg | Freq: Once | INTRAMUSCULAR | Status: AC
  Administered 2017-09-30: 12.5 mg via INTRAVENOUS
  Filled 2017-09-30: qty 1

## 2017-09-30 NOTE — ED Notes (Signed)
Per Dr Darl Householder do orthostatic vitals after meds due to pt still being dizzy

## 2017-09-30 NOTE — Discharge Instructions (Signed)
Stay hydrated.   Take meclizine as needed for dizziness.   Continue your blood pressure medicines. Talk to your doctor about adjusting your blood pressure medicines to get it better under control   See your doctor in a week   Return to ER if you have worse dizziness, headaches, vomiting, fever, neck pain, chest pain, palpitations

## 2017-09-30 NOTE — ED Notes (Signed)
Bed: OY77 Expected date:  Expected time:  Means of arrival:  Comments: EMS palpitations from cancer center

## 2017-09-30 NOTE — Progress Notes (Signed)
Pt arrived to nursing station in Radiation Oncology. Pt had not received radiation treatment today. Pt c/o feeling like she was "going to pass out". Pt c/o heart palpitations and throbbing skull base headache for past 1-2 days, intermittent. VS were taken with BP 204/75. ED was offered to pt and pt agreeable to ED visit at [facility] ED. This RN contacted [facility] ED charge RN Maylon Cos who gave room 24 assignment. Pt was transported to [facility] ED via wheelchair without incident. Pt voided large amount of urine without sample obtained. Pt kept eyes closed during transfer due to "dizziness".  Pt was helped into gown, placed on cardiac monitor and BP cuff placed on LEFT arm as pt is being treated for RIGHT breast cancer. Loma Sousa, RN BSN

## 2017-09-30 NOTE — ED Provider Notes (Signed)
[facility] DEPT Provider Note   CSN: [id number] Arrival date & time: 09/30/17  1550     History   Chief Complaint Chief Complaint  Patient presents with  . Hypertension  . Dizziness  . Headache    HPI Katelyn Porter is a 67 y.o. female history of breast cancer on radiation, no vertigo, palpitations here presenting with worse vertigo, headache, palpitations.  Patient states that she is on radiation for her breast cancer and most recently about 2 days ago.  Patient states that she has been having palpitations for the last several days.  Patient also has been started having intermittent headaches and feels like the room is spinning.  Patient states that she has history of vertigo and got worse over the last several days.  Patient has no weakness or numbness or trouble speaking. Patient states that she has severe claustrophobia and is unable to get MRI. Patient has no hx of stroke or aneurysms. States that her breast cancer is localized and she has no known brain mets.   The history is provided by the patient.    Past Medical History:  Diagnosis Date  . Breast cancer ([place]) 2008   left, with radiation finished 08, tamoxifen  . Breast cancer ([place]) 2019   Rt. had lymph node removed  having radiation currently  . Complication of anesthesia    woke up with colonoscopy and hysterectomy  . Depression 01/24/2013  . Family history of breast cancer   . Family history of colon cancer   . GERD (gastroesophageal reflux disease)   . Hyperlipidemia   . Hypertension   . IBS (irritable bowel syndrome)   . Palpitations   . Vertigo     Patient Active Problem List   Diagnosis Date Noted  . Family history of ovarian cancer 07/21/2017  . Genetic testing 07/13/2017  . Family history of breast cancer   . Family history of colon cancer   . Malignant neoplasm of upper-outer quadrant of right breast in female, estrogen receptor positive ([place]) 06/29/2017  . 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 Hyperlipidemia 07/23/2016  . Essential (primary) hypertension 05/11/2014  . Gastro-esophageal reflux disease without esophagitis 01/24/2013  . Recurrent major depressive disorder, in partial remission ([place]) 01/24/2013  . Routine general medical examination at a health care facility 06/22/2012  . Other malaise and fatigue 05/10/2012  . Hot flashes 05/10/2012  . Unspecified constipation 05/10/2012    Past Surgical History:  Procedure Laterality Date  . ABDOMINAL HYSTERECTOMY  1985   TAH (ovaries remain)  . BREAST LUMPECTOMY Left 2007  . BREAST LUMPECTOMY WITH RADIOACTIVE SEED AND SENTINEL LYMPH NODE BIOPSY Right 07/22/2017   Procedure: BREAST LUMPECTOMY WITH RADIOACTIVE SEED AND SENTINEL LYMPH NODE BIOPSY;  Surgeon: Erroll Luna, MD;  Location: [facility];  Service: General;  Laterality: Right;  . COLONOSCOPY    . TONSILLECTOMY     age 29     OB History    Gravida  1   Para  0   Term      Preterm      AB      Living  0     SAB      TAB      Ectopic      Multiple      Live Births               Home Medications    Prior to  Admission medications   Medication Sig Start Date End Date Taking? Authorizing Provider  Cholecalciferol (VITAMIN D3) 1000 units CAPS Take 1 capsule by mouth daily.   Yes [provider]  DULoxetine (CYMBALTA) 60 MG capsule Take 60 mg by mouth daily. 06/22/17  Yes [provider]  Multiple Vitamins-Minerals (ONE-A-DAY WOMENS 50 PLUS PO) Take 1 tablet by mouth daily.   Yes [provider]  pantoprazole (PROTONIX) 40 MG tablet TAKE 1 TABLET BY MOUTH ONCE DAILY 07/23/17  Yes Irene Shipper, MD  pravastatin (PRAVACHOL) 40 MG tablet Take 1 tablet (40 mg total) by mouth daily. 05/08/13  Yes Debbrah Alar, NP  triamterene-hydrochlorothiazide (DYAZIDE) 37.5-25 MG capsule Take by mouth.   Yes [provider]  anastrozole  (ARIMIDEX) 1 MG tablet Take 1 tablet (1 mg total) by mouth daily. Patient not taking: Reported on 09/07/2017 09/06/17   Magrinat, Virgie Dad, MD  nitrofurantoin, macrocrystal-monohydrate, (MACROBID) 100 MG capsule 1 tab bid x 3 days with symptoms. 09/09/17   Megan Salon, MD    Family History Family History  Problem Relation Age of Onset  . Lung cancer Father        Died at 51 from lung cancer  . Heart disease Father        heart attack  . Colon cancer Mother 74  . Hypertension Mother   . Kidney disease Mother   . Diabetes Mother   . Hypertension Sister   . Diabetes Sister   . Colon polyps Sister   . Breast cancer Maternal Aunt        dx in her 63s  . Coronary artery disease Brother 32       died of "massive heart attack"  . Diabetes Brother   . Colon cancer Cousin        maternal 1st cousin dx over 67  . Ovarian cancer Paternal Grandmother   . Breast cancer Cousin        mat 1st cousin dx in her 88s  . Esophageal cancer Neg Hx   . Stomach cancer Neg Hx   . Rectal cancer Neg Hx     Social History Social History   Tobacco Use  . Smoking status: Never Smoker  . Smokeless tobacco: Never Used  Substance Use Topics  . Alcohol use: No  . Drug use: No     Allergies   Crab [shellfish allergy]; Sulfa antibiotics; Lipitor [atorvastatin]; and Sulfa drugs cross reactors   Review of Systems Review of Systems  Cardiovascular: Positive for palpitations.  Neurological: Positive for dizziness and headaches.  All other systems reviewed and are negative.    Physical Exam Updated Vital Signs BP (!) 170/71   Pulse 76   Resp 20   Wt 94.3 kg (208 lb)   LMP 03/17/1983   SpO2 99%   BMI 37.74 kg/m   Physical Exam  Constitutional: She is oriented to person, place, and time.  Slightly uncomfortable   HENT:  Head: Normocephalic.  Mouth/Throat: Oropharynx is clear and moist.  Eyes: Pupils are equal, round, and reactive to light. EOM are normal.  Neck: Normal range of  motion. Neck supple.  Cardiovascular: Normal rate, regular rhythm and normal heart sounds.  Pulmonary/Chest: Effort normal and breath sounds normal.  Abdominal: Soft. Bowel sounds are normal.  Musculoskeletal: Normal range of motion.  Neurological: She is alert and oriented to person, place, and time. She displays a negative Romberg sign.  CN 2- 12 intact. No obvious nystagmus. Nl finger to  nose bilaterally, nl strength bilaterally. Nl gait   Skin: Skin is warm.  Psychiatric: She has a normal mood and affect. Her behavior is normal.  Nursing note and vitals reviewed.    ED Treatments / Results  Labs (all labs ordered are listed, but only abnormal results are displayed) Labs Reviewed  URINALYSIS, ROUTINE W REFLEX MICROSCOPIC - Abnormal; Notable for the following components:      Result Value   Specific Gravity, Urine 1.004 (*)    All other components within normal limits  COMPREHENSIVE METABOLIC PANEL - Abnormal; Notable for the following components:   Glucose, Bld 120 (*)    All other components within normal limits  CBC WITH DIFFERENTIAL/PLATELET  CBG MONITORING, ED  I-STAT TROPONIN, ED    EKG EKG Interpretation  Date/Time:  Thursday September 30 2017 16:19:55 EDT Ventricular Rate:  65 PR Interval:    QRS Duration: 94 QT Interval:  412 QTC Calculation: 429 R Axis:   55 Text Interpretation:  Sinus rhythm No significant change since last tracing Confirmed by Wandra Arthurs [phone]) on 09/30/2017 4:34:52 PM   Radiology Dg Chest 2 View  Result Date: 09/30/2017 CLINICAL DATA:  Hypertension and palpitation EXAM: CHEST - 2 VIEW COMPARISON:  April 20, 2016 FINDINGS: The heart size and mediastinal contours are within normal limits. There is no focal infiltrate, pulmonary edema, or pleural effusion. The visualized skeletal structures are unremarkable. IMPRESSION: No active cardiopulmonary disease. Electronically Signed   By: Abelardo Diesel M.D.   On: 09/30/2017 18:22   Ct Head Wo  Contrast  Result Date: 09/30/2017 CLINICAL DATA:  Dizziness, headache EXAM: CT HEAD WITHOUT CONTRAST TECHNIQUE: Contiguous axial images were obtained from the base of the skull through the vertex without intravenous contrast. COMPARISON:  01/20/2010 FINDINGS: Brain: No acute intracranial abnormality. Specifically, no hemorrhage, hydrocephalus, mass lesion, acute infarction, or significant intracranial injury. Vascular: No hyperdense vessel or unexpected calcification. Skull: No acute calvarial abnormality. Sinuses/Orbits: Visualized paranasal sinuses and mastoids clear. Orbital soft tissues unremarkable. Other: None IMPRESSION: No acute intracranial abnormality. Electronically Signed   By: Rolm Baptise M.D.   On: 09/30/2017 18:18    Procedures Procedures (including critical care time)  Medications Ordered in ED Medications  meclizine (ANTIVERT) tablet 25 mg (25 mg Oral Given 09/30/17 1721)  hydrALAZINE (APRESOLINE) injection 5 mg (5 mg Intravenous Given 09/30/17 1721)  promethazine (PHENERGAN) injection 12.5 mg (12.5 mg Intravenous Given 09/30/17 1724)  dexamethasone (DECADRON) injection 4 mg (4 mg Intravenous Given 09/30/17 1725)  ketorolac (TORADOL) 30 MG/ML injection 30 mg (30 mg Intravenous Given 09/30/17 2003)  metoCLOPramide (REGLAN) injection 10 mg (10 mg Intravenous Given 09/30/17 2003)  diphenhydrAMINE (BENADRYL) injection 12.5 mg (12.5 mg Intravenous Given 09/30/17 2004)     Initial Impression / Assessment and Plan / ED Course  I have reviewed the triage vital signs and the nursing notes.  Pertinent labs & imaging results that were available during my care of the patient were reviewed by me and considered in my medical decision making (see chart for details).     Katelyn Porter is a 67 y.o. female here with dizziness, headaches, palpitations. Has hx of vertigo and I think likely peripheral vertigo vs complex migraines vs symptomatic hypertension. Has no known mets to the brain and no  hx of stroke or aneurysms. Patient has unremarkable neuro exam and I doubt posterior circulation stroke. Patient has severe claustrophobia and has been unable to tolerate MRI in the past and I don't think she needs  MRI currently. Will get labs, CT head, EKG. Will give migraine cocktail and reassess.      8:16 PM Labs unremarkable. CT head showed no bleed. Patient not orthostatic after migraine cocktail, meclizine, IVF. BP down to 117/80 after BP meds. I think likely complex migraine vs symptomatic hypertension. I doubt cerebellar stroke. Told her to talk to PCP about BP meds and take meclizine prn for dizziness.    Final Clinical Impressions(s) / ED Diagnoses   Final diagnoses:  None    ED Discharge Orders    None       Drenda Freeze, MD 09/30/17 2017

## 2017-09-30 NOTE — ED Triage Notes (Signed)
Pt brought over from [facility] with complaints of dizziness, headache, hypertension, and palpitations. Pt came in for her radiation for Right breast cancer and blood pressure was 204/75 sitting and 204/47 standing. Pt also states "she is dizzy, feels her heart pounding and has headache in the back of the head like she's never had before" pt also states she "feels like she is going to fall out" Pt did not get radiation and was brought straight here.

## 2017-10-01 ENCOUNTER — Ambulatory Visit
Admission: RE | Admit: 2017-10-01 | Discharge: 2017-10-01 | Disposition: A | Discharge status: Home / Self Care | Payer: Medicare Other | Source: Ambulatory Visit | Attending: Radiation Oncology | Admitting: Radiation Oncology

## 2017-10-01 DIAGNOSIS — Z510 Encounter for antineoplastic radiation therapy: Secondary | ICD-10-CM | POA: Diagnosis not present

## 2017-10-04 ENCOUNTER — Other Ambulatory Visit: Payer: Self-pay | Admitting: Obstetrics & Gynecology

## 2017-10-04 ENCOUNTER — Ambulatory Visit
Admission: RE | Admit: 2017-10-04 | Discharge: 2017-10-04 | Disposition: A | Discharge status: Home / Self Care | Payer: Medicare Other | Source: Ambulatory Visit | Attending: Radiation Oncology | Admitting: Radiation Oncology

## 2017-10-04 DIAGNOSIS — E2839 Other primary ovarian failure: Secondary | ICD-10-CM

## 2017-10-04 DIAGNOSIS — Z510 Encounter for antineoplastic radiation therapy: Secondary | ICD-10-CM | POA: Diagnosis not present

## 2017-10-05 ENCOUNTER — Ambulatory Visit
Admission: RE | Admit: 2017-10-05 | Discharge: 2017-10-05 | Disposition: A | Discharge status: Home / Self Care | Payer: Medicare Other | Source: Ambulatory Visit | Attending: Radiation Oncology | Admitting: Radiation Oncology

## 2017-10-05 ENCOUNTER — Ambulatory Visit: Payer: Medicare Other | Admitting: Radiation Oncology

## 2017-10-05 DIAGNOSIS — Z510 Encounter for antineoplastic radiation therapy: Secondary | ICD-10-CM | POA: Diagnosis not present

## 2017-10-05 DIAGNOSIS — C50411 Malignant neoplasm of upper-outer quadrant of right female breast: Secondary | ICD-10-CM

## 2017-10-05 DIAGNOSIS — Z170 Estrogen receptor positive status [ER+]: Principal | ICD-10-CM

## 2017-10-05 MED ORDER — RADIAPLEXRX EX GEL
Freq: Two times a day (BID) | CUTANEOUS | Status: DC
  Administered 2017-10-05: 16:00:00 via TOPICAL

## 2017-10-06 ENCOUNTER — Ambulatory Visit
Admission: RE | Admit: 2017-10-06 | Discharge: 2017-10-06 | Disposition: A | Discharge status: Home / Self Care | Payer: Medicare Other | Source: Ambulatory Visit | Attending: Radiation Oncology | Admitting: Radiation Oncology

## 2017-10-06 DIAGNOSIS — Z510 Encounter for antineoplastic radiation therapy: Secondary | ICD-10-CM | POA: Diagnosis not present

## 2017-10-07 ENCOUNTER — Ambulatory Visit
Admission: RE | Admit: 2017-10-07 | Discharge: 2017-10-07 | Disposition: A | Discharge status: Home / Self Care | Payer: Medicare Other | Source: Ambulatory Visit | Attending: Radiation Oncology | Admitting: Radiation Oncology

## 2017-10-07 DIAGNOSIS — Z510 Encounter for antineoplastic radiation therapy: Secondary | ICD-10-CM | POA: Diagnosis not present

## 2017-10-08 ENCOUNTER — Ambulatory Visit
Admission: RE | Admit: 2017-10-08 | Discharge: 2017-10-08 | Disposition: A | Discharge status: Home / Self Care | Payer: Medicare Other | Source: Ambulatory Visit | Attending: Radiation Oncology | Admitting: Radiation Oncology

## 2017-10-08 DIAGNOSIS — Z510 Encounter for antineoplastic radiation therapy: Secondary | ICD-10-CM | POA: Diagnosis not present

## 2017-10-11 ENCOUNTER — Ambulatory Visit
Admission: RE | Admit: 2017-10-11 | Discharge: 2017-10-11 | Disposition: A | Discharge status: Home / Self Care | Payer: Medicare Other | Source: Ambulatory Visit | Attending: Radiation Oncology | Admitting: Radiation Oncology

## 2017-10-11 ENCOUNTER — Ambulatory Visit: Payer: Medicare Other

## 2017-10-11 DIAGNOSIS — Z510 Encounter for antineoplastic radiation therapy: Secondary | ICD-10-CM | POA: Diagnosis not present

## 2017-10-12 ENCOUNTER — Ambulatory Visit: Payer: Medicare Other

## 2017-10-12 ENCOUNTER — Ambulatory Visit
Admission: RE | Admit: 2017-10-12 | Discharge: 2017-10-12 | Disposition: A | Discharge status: Home / Self Care | Payer: Medicare Other | Source: Ambulatory Visit | Attending: Radiation Oncology | Admitting: Radiation Oncology

## 2017-10-12 DIAGNOSIS — Z510 Encounter for antineoplastic radiation therapy: Secondary | ICD-10-CM | POA: Diagnosis not present

## 2017-10-13 ENCOUNTER — Ambulatory Visit
Admission: RE | Admit: 2017-10-13 | Discharge: 2017-10-13 | Disposition: A | Discharge status: Home / Self Care | Payer: Medicare Other | Source: Ambulatory Visit | Attending: Radiation Oncology | Admitting: Radiation Oncology

## 2017-10-13 ENCOUNTER — Ambulatory Visit: Payer: Medicare Other

## 2017-10-13 DIAGNOSIS — Z510 Encounter for antineoplastic radiation therapy: Secondary | ICD-10-CM | POA: Diagnosis not present

## 2017-10-14 ENCOUNTER — Ambulatory Visit
Admission: RE | Admit: 2017-10-14 | Discharge: 2017-10-14 | Disposition: A | Discharge status: Home / Self Care | Payer: Medicare Other | Source: Ambulatory Visit | Attending: Radiation Oncology | Admitting: Radiation Oncology

## 2017-10-14 DIAGNOSIS — C50411 Malignant neoplasm of upper-outer quadrant of right female breast: Secondary | ICD-10-CM | POA: Diagnosis not present

## 2017-10-14 DIAGNOSIS — Z510 Encounter for antineoplastic radiation therapy: Secondary | ICD-10-CM | POA: Diagnosis present

## 2017-10-14 DIAGNOSIS — Z170 Estrogen receptor positive status [ER+]: Secondary | ICD-10-CM | POA: Insufficient documentation

## 2017-10-15 ENCOUNTER — Ambulatory Visit: Payer: Medicare Other

## 2017-10-15 ENCOUNTER — Ambulatory Visit
Admission: RE | Admit: 2017-10-15 | Discharge: 2017-10-15 | Disposition: A | Discharge status: Home / Self Care | Payer: Medicare Other | Source: Ambulatory Visit | Attending: Radiation Oncology | Admitting: Radiation Oncology

## 2017-10-15 DIAGNOSIS — Z510 Encounter for antineoplastic radiation therapy: Secondary | ICD-10-CM | POA: Diagnosis not present

## 2017-10-18 ENCOUNTER — Ambulatory Visit
Admission: RE | Admit: 2017-10-18 | Discharge: 2017-10-18 | Disposition: A | Discharge status: Home / Self Care | Payer: Medicare Other | Source: Ambulatory Visit | Attending: Radiation Oncology | Admitting: Radiation Oncology

## 2017-10-18 ENCOUNTER — Ambulatory Visit: Payer: Medicare Other

## 2017-10-18 DIAGNOSIS — Z510 Encounter for antineoplastic radiation therapy: Secondary | ICD-10-CM | POA: Diagnosis not present

## 2017-10-19 ENCOUNTER — Encounter: Payer: Self-pay | Admitting: *Deleted

## 2017-10-19 ENCOUNTER — Encounter: Payer: Self-pay | Admitting: Radiation Oncology

## 2017-10-19 ENCOUNTER — Ambulatory Visit
Admission: RE | Admit: 2017-10-19 | Discharge: 2017-10-19 | Disposition: A | Discharge status: Home / Self Care | Payer: Medicare Other | Source: Ambulatory Visit | Attending: Radiation Oncology | Admitting: Radiation Oncology

## 2017-10-19 DIAGNOSIS — Z510 Encounter for antineoplastic radiation therapy: Secondary | ICD-10-CM | POA: Diagnosis not present

## 2017-10-20 NOTE — Progress Notes (Signed)
  Radiation Oncology         [phone] ________________________________  Name: Maliea Grandmaison MRN: [id number]  Date: 10/19/2017  DOB: 1950/11/10  End of Treatment Note  Diagnosis:   67 y.o.female with Stage IA, pT1bN0,invasive ductal carcinomaof the right breast, with extracellular mucin; grade 2; estrogen and progesterone receptor positive, HER-2negative     Indication for treatment:  Curative       Radiation treatment dates:   08/31/2017 to 10/19/2017  Site/dose:    1. The Right breast was treated to 50.4 Gy in 28 fractions of 1.8 Gy. 2. The Right breast was boosted to 10 Gy in 5 fractions of 2 Gy.  Beams/energy:    1. 3D // 10X, 6X 2.  Isodose plan // 10X  Narrative: The patient tolerated radiation treatment relatively well.  Patient developed hyperpigmentation changes and some peeling within the right breast. Some small areas of skin breakdown with early moist desquamation. She reports sharp shooting and dull pains in her breast area. She also reports pain around her nipple area. Reports moderate fatigue. States she is using her radiaplex as directed.   Plan: The patient has completed radiation treatment. Patient will use tripe antibiotic ointment. The patient will return to radiation oncology clinic for routine followup in one month. I advised them to call or return sooner if they have any questions or concerns related to their recovery or treatment.  -----------------------------------  Blair Promise, PhD, MD  This document serves as a record of services personally performed by Gery Pray, MD. It was created on his behalf by Arlyce Harman, a trained medical scribe. The creation of this record is based on the scribe's personal observations and the provider's statements to them. This document has been checked and approved by the attending provider.

## 2017-11-22 ENCOUNTER — Ambulatory Visit
Admission: RE | Admit: 2017-11-22 | Discharge: 2017-11-22 | Disposition: A | Discharge status: Home / Self Care | Payer: Medicare Other | Source: Ambulatory Visit | Attending: Radiation Oncology | Admitting: Radiation Oncology

## 2017-11-22 ENCOUNTER — Encounter: Payer: Self-pay | Admitting: Radiation Oncology

## 2017-11-22 ENCOUNTER — Telehealth: Payer: Self-pay

## 2017-11-22 ENCOUNTER — Other Ambulatory Visit: Payer: Self-pay

## 2017-11-22 VITALS — BP 142/62 | HR 79 | Temp 98.2°F | Resp 18 | Ht 64.0 in | Wt 206.4 lb

## 2017-11-22 DIAGNOSIS — Z882 Allergy status to sulfonamides status: Secondary | ICD-10-CM | POA: Insufficient documentation

## 2017-11-22 DIAGNOSIS — Z79899 Other long term (current) drug therapy: Secondary | ICD-10-CM | POA: Insufficient documentation

## 2017-11-22 DIAGNOSIS — Z170 Estrogen receptor positive status [ER+]: Secondary | ICD-10-CM | POA: Diagnosis not present

## 2017-11-22 DIAGNOSIS — C50411 Malignant neoplasm of upper-outer quadrant of right female breast: Secondary | ICD-10-CM | POA: Insufficient documentation

## 2017-11-22 NOTE — Progress Notes (Signed)
Radiation Oncology         [id number]) [phone] ________________________________  Name: Katelyn Porter MRN: [id number]  Date: 11/22/2017  DOB: April 06, 1950  Follow-Up Visit Note  CC: [name], DO  Magrinat, Virgie Dad, MD    ICD-10-CM   1. Malignant neoplasm of upper-outer quadrant of right breast in female, estrogen receptor positive ([place]) C50.411    Z17.0     Diagnosis:   67 y.o.female with Stage IA, pT1bN0,invasive ductal carcinomaof the right breast, with extracellular mucin; grade 2; estrogen and progesterone receptor positive, HER-2negative     Interval Since Last Radiation:  1 month 3 days    Radiation treatment dates:   08/31/2017 to 10/19/2017  Site/dose:    1. The Right breast was treated to 50.4 Gy in 28 fractions of 1.8 Gy. 2. The Right breast was boosted to 10 Gy in 5 fractions of 2 Gy.  Narrative:  The patient returns today for routine follow-up following completion of her radiation treatments on 10/19/2017. She is doing well overall. She reports that following treatment, she took a week off and worked for 3 days and then quit her job. She notes that she feels much better that she retired at this time. She has gotten a lot of rest recently and she is able to complete her daily activities without any issues. She has received her prescription for anastrozole that was given by Dr. [name].   On review of systems, she reports soreness to her right axilla. She notes that if she has pain she will take extra-strength tylenol for her pain as needed. she denies any other symptoms. Pertinent positives are listed and detailed within the above HPI.                 ALLERGIES:  is allergic to crab [shellfish allergy]; sulfa antibiotics; lipitor [atorvastatin]; and sulfa drugs cross reactors.  Meds: Current Outpatient Medications  Medication Sig Dispense Refill  . anastrozole (ARIMIDEX) 1 MG tablet Take 1 tablet (1 mg total) by mouth daily. 90 tablet 4  . Cholecalciferol  (VITAMIN D3) 1000 units CAPS Take 1 capsule by mouth daily.    . DULoxetine (CYMBALTA) 60 MG capsule Take 60 mg by mouth daily.    . meclizine (ANTIVERT) 12.5 MG tablet Take 1 tablet (12.5 mg total) by mouth 3 (three) times daily as needed for dizziness. 20 tablet 0  . Multiple Vitamins-Minerals (ONE-A-DAY WOMENS 50 PLUS PO) Take 1 tablet by mouth daily.    . nitrofurantoin, macrocrystal-monohydrate, (MACROBID) 100 MG capsule 1 tab bid x 3 days with symptoms. 30 capsule 0  . pantoprazole (PROTONIX) 40 MG tablet TAKE 1 TABLET BY MOUTH ONCE DAILY 90 tablet 1  . pravastatin (PRAVACHOL) 40 MG tablet Take 1 tablet (40 mg total) by mouth daily. 30 tablet 5  . triamterene-hydrochlorothiazide (DYAZIDE) 37.5-25 MG capsule Take by mouth.     Current Facility-Administered Medications  Medication Dose Route Frequency Provider Last Rate Last Dose  . 0.9 %  sodium chloride infusion  500 mL Intravenous Once Irene Shipper, MD        Physical Findings: The patient is in no acute distress. Patient is alert and oriented.  height is '5\' 4"'  (1.626 m) and weight is 206 lb 6 oz (93.6 kg). Her oral temperature is 98.2 F (36.8 C). Her blood pressure is 142/62 (abnormal) and her pulse is 79. Her respiration is 18 and oxygen saturation is 100%.  Lungs are clear to auscultation bilaterally. Heart has regular  rate and rhythm. No palpable cervical, supraclavicular, or axillary adenopathy. Abdomen soft, non-tender, normal bowel sounds. Left breast no palpable masses, nipple discharge, or bleeding. Right breast with hyperpigmentation changes in the breast area. Skin is well healed. No palpable mass, nipple discharge, or bleeding. Patient is sensitivity with palpation in the right axillary region, but no palpable abnormality.   Lab Findings: Lab Results  Component Value Date   WBC 4.9 09/30/2017   HGB 14.1 09/30/2017   HCT 42.4 09/30/2017   MCV 91.6 09/30/2017   PLT 278 09/30/2017    Radiographic Findings: No results  found.  Impression: No evidence of recurrence on clinical exam. Patient has some mild tenderness in the breast as expected.   Plan:  Routine follow up in 3 months. I've recommended that she began her adjuvant hormonal therapy.   ____________________________________   Blair Promise, PhD, MD    This document serves as a record of services personally performed by Gery Pray, MD. It was created on his behalf by [facility], a trained medical scribe. The creation of this record is based on the scribe's personal observations and the provider's statements to them. This document has been checked and approved by the attending provider.

## 2017-11-22 NOTE — Telephone Encounter (Signed)
Per 9/9 phone que. patient called to verify appointment.

## 2018-01-24 NOTE — Progress Notes (Signed)
Katelyn Porter  [phone] [phone]     ID: Katelyn Porter DOB: 02/21/1951  MR#: [id number]  JYN#:[id number]  Patient Care Team: [name], DO as PCP - General (Internal Medicine) Megan Salon, MD as Attending Physician (Obstetrics and Gynecology) Gaige Sebo, Virgie Dad, MD as Consulting Physician (Oncology) Erroll Luna, MD as Consulting Physician (General Surgery) Gery Pray, MD as Consulting Physician (Radiation Oncology) OTHER MD:  CHIEF COMPLAINT: Estrogen receptor positive breast cancer  CURRENT TREATMENT: Anastrozole   HISTORY OF CURRENT ILLNESS: From the original intake note:  Katelyn Porter has a 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  She was treated with surgery radiation and tamoxifen for 5 years and we released her from follow-up in 2013.  More recently she had routine screening mammography, on 06/16/2017 showing a possible abnormality in the right breast. She underwent unilateral right breast diagnostic mammography with tomography and right breast ultrasonography at [facility] on 06/21/2017 showing breast density category B. Suspicious new mass in the right breast 9 o'clock central and 4 cm from the nipple measuring 0.8 x 0.6 x 0.7 cm. There was no axillary adenopathy sonographically.   Accordingly on 06/22/2017 she proceeded to biopsy of the right breast area in question. The pathology from this procedure showed ([id number]): Breast, right, needle core biopsy, 9:15 o'clock, UOQ with invasive ductal carcinoma with extracellular mucin, grade 2. Prognostic indicators significant for: estrogen receptor, 100% positive and progesterone receptor, 5% positive, both with strong staining intensity. Proliferation marker Ki67 at 20%. HER2 negative.  The patient's subsequent history is as detailed below.  INTERVAL HISTORY: Katelyn Porter returns today for follow-up and treatment of her estrogen receptor  positive breast cancer.   Since her last visit here, she completed adjuvant radiation treatment on 10/19/2017. She tolerated treatment well, however, became very fatigued during treatment. Dr. Sondra Come ended up giving her a week off of work to rest and reports after going back she felt it was too much so she retired.   She continues to have hot flashes at night, making it even harder for her to sleep at night.   REVIEW OF SYSTEMS: Katelyn Porter reports her husband passed away 2018-02-09 due to a hemorraghic stroke.  This was not unexpected death.  She has not been sleeping much since and reports being very fatigued. The patient denies unusual headaches, visual changes, nausea, vomiting, or dizziness. There has been no unusual cough, phlegm production, or pleurisy. This been no change in bowel or bladder habits. The patient denies unexplained weight loss, bleeding, rash, or fever. A detailed review of systems was otherwise noncontributory.    PAST MEDICAL HISTORY: Past Medical History:  Diagnosis Date  . Breast cancer ([place]) 2008   left, with radiation finished 08, tamoxifen  . Breast cancer ([place]) 2019   Rt. had lymph node removed  having radiation currently  . Complication of anesthesia    woke up with colonoscopy and hysterectomy  . Depression 01/24/2013  . Family history of breast cancer   . Family history of colon cancer   . GERD (gastroesophageal reflux disease)   . Hyperlipidemia   . Hypertension   . IBS (irritable bowel syndrome)   . Palpitations   . Vertigo     PAST SURGICAL HISTORY: Past Surgical History:  Procedure Laterality Date  . ABDOMINAL HYSTERECTOMY  1985   TAH (ovaries remain)  . BREAST LUMPECTOMY Left 2007  . BREAST LUMPECTOMY WITH RADIOACTIVE SEED AND SENTINEL LYMPH NODE  BIOPSY Right 07/22/2017   Procedure: BREAST LUMPECTOMY WITH RADIOACTIVE SEED AND SENTINEL LYMPH NODE BIOPSY;  Surgeon: Erroll Luna, MD;  Location: [facility];  Service: General;  Laterality:  Right;  . COLONOSCOPY    . TONSILLECTOMY     age 66    FAMILY HISTORY Family History  Problem Relation Age of Onset  . Lung cancer Father        Died at 32 from lung cancer  . Heart disease Father        heart attack  . Colon cancer Mother 46  . Hypertension Mother   . Kidney disease Mother   . Diabetes Mother   . Hypertension Sister   . Diabetes Sister   . Colon polyps Sister   . Breast cancer Maternal Aunt        dx in her 97s  . Coronary artery disease Brother 23       died of "massive heart attack"  . Diabetes Brother   . Colon cancer Cousin        maternal 1st cousin dx over 8  . Ovarian cancer Paternal Grandmother   . Breast cancer Cousin        mat 1st cousin dx in her 47s  . Esophageal cancer Neg Hx   . Stomach cancer Neg Hx   . Rectal cancer Neg Hx   The patient's father had a history of tongue cancer which apparently spread to his lungs according to the patient. He passed due to a MI at age 14. The patient's mother died at age 53 due to renal failure and diabetes.  She also had a history of colon cancer. The patient had 1 brother who died from an MI; and 1 sister.  There was a maternal aunt diagnosed with breast cancer in the 59's. A maternal cousin passed due to breast cancer. Another maternal cousin had colon cancer. She denies a family history of ovarian cancer.   GYNECOLOGIC HISTORY:  Patient's last menstrual period was 03/17/1983. Menarche: 67 years old. She is GXP0. Her LMP was age 58 after her hysterectomy with BSO. She took HRT until 2008, discontinued at the time of her initial breast cancer diagnosis.    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 Her husband, Katelyn Porter, worked in [employer] but is retired. He passed away 02/13/2018 from a hemorraghic stroke. He had 2 children from a prior marriage, a daughter Zigmund Daniel, who lives in [place] and works for [employer], and  a son, Personnel officer, who works for the [employer] in [place], [place]. The patient has 3 step-grandchildren. She now lives alone. She attends [employer].     ADVANCED DIRECTIVES:    HEALTH MAINTENANCE: Social History   Tobacco Use  . Smoking status: Never Smoker  . Smokeless tobacco: Never Used  Substance Use Topics  . Alcohol use: No  . Drug use: No     Colonoscopy: UTD/Dr. Perry/ normal  PAP: s/p TAHBSO  Bone density:   Allergies  Allergen Reactions  . Crab [Shellfish Allergy] Swelling    Blisters on tongue.  . Sulfa Antibiotics Other (See Comments)    headache  . Lipitor [Atorvastatin]     Muscle cramping  . Sulfa Drugs Cross Reactors     Current Outpatient Medications  Medication Sig Dispense Refill  . anastrozole (ARIMIDEX) 1 MG tablet Take 1 tablet (1 mg total) by mouth daily. Onward  tablet 4  . Cholecalciferol (VITAMIN D3) 1000 units CAPS Take 1 capsule by mouth daily.    . DULoxetine (CYMBALTA) 60 MG capsule Take 60 mg by mouth daily.    . meclizine (ANTIVERT) 12.5 MG tablet Take 1 tablet (12.5 mg total) by mouth 3 (three) times daily as needed for dizziness. 20 tablet 0  . Multiple Vitamins-Minerals (ONE-A-DAY WOMENS 50 PLUS PO) Take 1 tablet by mouth daily.    . nitrofurantoin, macrocrystal-monohydrate, (MACROBID) 100 MG capsule 1 tab bid x 3 days with symptoms. 30 capsule 0  . pantoprazole (PROTONIX) 40 MG tablet TAKE 1 TABLET BY MOUTH ONCE DAILY 90 tablet 1  . pravastatin (PRAVACHOL) 40 MG tablet Take 1 tablet (40 mg total) by mouth daily. 30 tablet 5  . triamterene-hydrochlorothiazide (DYAZIDE) 37.5-25 MG capsule Take by mouth.     Current Facility-Administered Medications  Medication Dose Route Frequency Provider Last Rate Last Dose  . 0.9 %  sodium chloride infusion  500 mL Intravenous Once Irene Shipper, MD        OBJECTIVE: Middle-aged African-American woman who appears stated age  46:   01/25/18 1259  BP: (!) 138/54  Pulse: 76    Resp: 18  Temp: 98.4 F (36.9 C)  SpO2: 100%     Body mass index is 35.31 kg/m.   Wt Readings from Last 3 Encounters:  01/25/18 205 lb 11.2 oz (93.3 kg)  11/22/17 206 lb 6 oz (93.6 kg)  09/30/17 208 lb (94.3 kg)      ECOG FS:1 - Symptomatic but completely ambulatory  Sclerae unicteric, pupils round and equal No cervical or supraclavicular adenopathy Lungs no rales or rhonchi Heart regular rate and rhythm Abd soft, nontender, positive bowel sounds MSK no focal spinal tenderness, no upper extremity lymphedema Neuro: nonfocal, well oriented, appropriate affect Breasts: The right breast has undergone lumpectomy and radiation.  There continues to be some hyperpigmentation and some coarsening of the skin.  The left breast is unremarkable--it also is status post remote lumpectomy and radiation.  There is no evidence of local recurrence on either side.  Both axillae are benign.  LAB RESULTS:  CMP     Component Value Date/Time   NA 143 01/25/2018 1237   K 3.6 01/25/2018 1237   CL 109 01/25/2018 1237   CO2 26 01/25/2018 1237   GLUCOSE 103 (H) 01/25/2018 1237   BUN 17 01/25/2018 1237   CREATININE 0.98 01/25/2018 1237   CREATININE 0.94 04/13/2013 1411   CALCIUM 9.1 01/25/2018 1237   PROT 6.5 01/25/2018 1237   ALBUMIN 3.6 01/25/2018 1237   AST 16 01/25/2018 1237   ALT 17 01/25/2018 1237   ALKPHOS 77 01/25/2018 1237   BILITOT 0.7 01/25/2018 1237   GFRNONAA 59 (L) 01/25/2018 1237   GFRNONAA 68 01/24/2013 0942   GFRAA >60 01/25/2018 1237   GFRAA 79 01/24/2013 0942    No results found for: TOTALPROTELP, ALBUMINELP, A1GS, A2GS, BETS, BETA2SER, GAMS, MSPIKE, SPEI  No results found for: KPAFRELGTCHN, LAMBDASER, KAPLAMBRATIO  Lab Results  Component Value Date   WBC 4.3 01/25/2018   NEUTROABS 2.5 01/25/2018   HGB 12.9 01/25/2018   HCT 39.1 01/25/2018   MCV 92.2 01/25/2018   PLT 238 01/25/2018    '@LASTCHEMISTRY' @  Lab Results  Component Value Date   LABCA2 30 07/28/2010     No components found for: [id number]  No results for input(s): INR in the last 168 hours.  Lab Results  Component Value Date   LABCA2 52  07/28/2010    No results found for: UUE280  No results found for: KLK917  No results found for: HXT056  No results found for: CA2729  No components found for: HGQUANT  No results found for: CEA1 / No results found for: CEA1   No results found for: AFPTUMOR  No results found for: [place]  No results found for: PSA1  Appointment on 01/25/2018  Component Date Value Ref Range Status  . Sodium 01/25/2018 143  135 - 145 mmol/L Final  . Potassium 01/25/2018 3.6  3.5 - 5.1 mmol/L Final  . Chloride 01/25/2018 109  98 - 111 mmol/L Final  . CO2 01/25/2018 26  22 - 32 mmol/L Final  . Glucose, Bld 01/25/2018 103* 70 - 99 mg/dL Final  . BUN 01/25/2018 17  8 - 23 mg/dL Final  . Creatinine, Ser 01/25/2018 0.98  0.44 - 1.00 mg/dL Final  . Calcium 01/25/2018 9.1  8.9 - 10.3 mg/dL Final  . Total Protein 01/25/2018 6.5  6.5 - 8.1 g/dL Final  . Albumin 01/25/2018 3.6  3.5 - 5.0 g/dL Final  . AST 01/25/2018 16  15 - 41 U/L Final  . ALT 01/25/2018 17  0 - 44 U/L Final  . Alkaline Phosphatase 01/25/2018 77  38 - 126 U/L Final  . Total Bilirubin 01/25/2018 0.7  0.3 - 1.2 mg/dL Final  . GFR calc non Af Amer 01/25/2018 59* >60 mL/min Final  . GFR calc Af Amer 01/25/2018 >60  >60 mL/min Final   Comment: (NOTE) The eGFR has been calculated using the CKD EPI equation. This calculation has not been validated in all clinical situations. eGFR's persistently <60 mL/min signify possible Chronic Kidney Disease.   Georgiann Hahn gap 01/25/2018 8  5 - 15 Final   Performed at [facility], [place] [address] [place], [place] [address]  . WBC 01/25/2018 4.3  4.0 - 10.5 K/uL Final  . RBC 01/25/2018 4.24  3.87 - 5.11 MIL/uL Final  . Hemoglobin 01/25/2018 12.9  12.0 - 15.0 g/dL Final  . HCT 01/25/2018 39.1  36.0 - 46.0 % Final  . MCV 01/25/2018  92.2  80.0 - 100.0 fL Final  . MCH 01/25/2018 30.4  26.0 - 34.0 pg Final  . MCHC 01/25/2018 33.0  30.0 - 36.0 g/dL Final  . RDW 01/25/2018 11.9  11.5 - 15.5 % Final  . Platelets 01/25/2018 238  150 - 400 K/uL Final  . nRBC 01/25/2018 0.0  0.0 - 0.2 % Final  . Neutrophils Relative % 01/25/2018 57  % Final  . Neutro Abs 01/25/2018 2.5  1.7 - 7.7 K/uL Final  . Lymphocytes Relative 01/25/2018 31  % Final  . Lymphs Abs 01/25/2018 1.4  0.7 - 4.0 K/uL Final  . Monocytes Relative 01/25/2018 9  % Final  . Monocytes Absolute 01/25/2018 0.4  0.1 - 1.0 K/uL Final  . Eosinophils Relative 01/25/2018 2  % Final  . Eosinophils Absolute 01/25/2018 0.1  0.0 - 0.5 K/uL Final  . Basophils Relative 01/25/2018 1  % Final  . Basophils Absolute 01/25/2018 0.0  0.0 - 0.1 K/uL Final  . Immature Granulocytes 01/25/2018 0  % Final  . Abs Immature Granulocytes 01/25/2018 0.01  0.00 - 0.07 K/uL Final   Performed at [facility], [place] [address], [place], [place] [address]    (this displays the last labs from the last 3 days)  No results found for: TOTALPROTELP, ALBUMINELP, A1GS, A2GS, BETS, BETA2SER, GAMS, MSPIKE, SPEI (  this displays SPEP labs)  No results found for: KPAFRELGTCHN, LAMBDASER, KAPLAMBRATIO (kappa/lambda light chains)  No results found for: HGBA, HGBA2QUANT, HGBFQUANT, HGBSQUAN (Hemoglobinopathy evaluation)   No results found for: LDH  No results found for: IRON, TIBC, IRONPCTSAT (Iron and TIBC)  No results found for: FERRITIN  Urinalysis    Component Value Date/Time   COLORURINE YELLOW 09/30/2017 1807   APPEARANCEUR CLEAR 09/30/2017 1807   LABSPEC 1.004 (L) 09/30/2017 1807   PHURINE 8.0 09/30/2017 1807   GLUCOSEU NEGATIVE 09/30/2017 1807   HGBUR NEGATIVE 09/30/2017 1807   BILIRUBINUR NEGATIVE 09/30/2017 1807   BILIRUBINUR n 04/13/2013 1318   KETONESUR NEGATIVE 09/30/2017 1807   PROTEINUR NEGATIVE 09/30/2017 1807   UROBILINOGEN negative 04/13/2013 1318    NITRITE NEGATIVE 09/30/2017 1807   LEUKOCYTESUR NEGATIVE 09/30/2017 1807     STUDIES: Breast MRI due this month  ELIGIBLE FOR AVAILABLE RESEARCH PROTOCOL: no  ASSESSMENT: 67 y.o. [place] , [place] woman status post left lumpectomy February 2008 for a grade 3 ductal carcinoma in situ which was strongly estrogen and progesterone receptor positive.  (a)   adjuvant radiation completed May 2008  (b)  on tamoxifen July 2008 through March 2013.  (1) status post right breast upper outer quadrant biopsy 06/22/2017 for a clinnical T1b N0, stage IA invasive ductal carcinoma, with extracellular mucin; grade 2; estrogen and progesterone receptor positive, HER-2 not amplified, with an MIB-1 of 20%.  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 The following genes were evaluated for sequence changes only: HOXB13*, NTHL1*, SDHA   (3) Oncotype obtained from the 06/22/2017 biopsy showed a score of 25, predicting a risk of recurrence outside the breast over the next 9 years of 12% if the patient took antiestrogens for 5 years.  It also shows no benefit from chemotherapy.  (4) status post right lumpectomy and sentinel lymph node sampling 07/22/2017 for a pT1b pN0, stage Ia invasive ductal carcinoma, grade 2, with extracellular mucin, and negative margins.  (a) a total of 3 lymph nodes were removed  (5) adjuvant radiation 08/31/2017 to 10/19/2017 Site/dose:    1. The Right breast was treated to 50.4 Gy in 28 fractions of  1.8 Gy. 2. The Right breast was boosted to 10 Gy in 5 fractions of 2 Gy.  (6) to start anastrozole 11/14/2017  (7) intensified screening: The patient will have yearly breast MRI November, and yearly mammography May   PLAN: We offered Katelyn Porter our condolences on her husband's sudden death.  It is not a surprise that she is having difficulty sleeping at night, now alone in the house.  She is tolerating anastrozole well although she is having some hot flashes particularly at night.  I think gabapentin at bedtime would be helpful in several counts.  She has a good understanding of the possible toxicity side effects and complications of that agent and requested to start it.  Otherwise the plan will be to continue anastrozole for a total of 5 years  We are continuing with intensified screening.  I have written for an MRI  of the breast this month and she will have mammography in May.  She will see me again in June  She knows to call for any other issues that may develop before the next visit.  ADDENDUM: From the genetics note dated 07/21/2017: The ATM gene is involved in the detection and surveillance of DNA damage.  ATM phosphorylation of BRCA1 is critical for proper response to DNA double-strand breaks.  This is believed to be the reason for the role ATM has in breast cancer risk.  Individuals with a ATM mutation are at a greater risk for having children with Ataxia-telangiectasia (A-T). AT is characterized by progressive cerebellar degeneration (ataxia), dilated blood vessels in the eyes and skin (telangiectasia), immunodeficiency, chromosomal instability, increased sensitivity to ionizing radiation and a predisposition to lymphoma and leukemia.  Therefore, individuals of childbearing age who have a known ATM mutation may want to consider having their spouse tested to determine their risk for having a child with A-T.  Studies of these families demonstrated increased incident of breast cancer in  the mothers (who are heterozygous carriers) of the affected children, thus prompting further evaluation of the relationship between breast cancer and ATM.  Women who are heterozygous ATM carriers have an increase breast cancer risk.  They have 5-fold increased risk of breast cancer <50 years, and 2-3 fold increased risk for breast cancer overall.  In families with familial breast cancer that were negative for BRCA1 or BRCA2 genes, approximately 2.7% of women were found to have one ATM mutation.  In families with both breast cancer and leukemia, 6.7% of women were found to have one ATM mutation.   Management for individuals with ATM mutations can be found in the NCCN guidelines (v.3.2019). These guidelines recommend the following:  Breast Cancer   Screening: Annual mammogram with consideration of tomosynthesis and consider breast MRI with contrast starting at age 39 years.  Risk Reducing Mastectomy: Evidence insufficient, consider based on family history  Ovarian Cancer  No increased risk for ovarian cancer, therefore no recommendations  Other Cancer Risks  Unknown or insufficient evidence for pancreatic or prostate cancer risk  Tyja Gortney, Virgie Dad, MD  01/25/18 1:38 PM Medical Oncology and Hematology [facility] [address] [place], [place] [address] Tel. [phone]    Fax. [phone]  I, Margit Banda am acting as a scribe for Chauncey Cruel, MD.   I, Lurline Del MD, have reviewed the above documentation for accuracy and completeness, and I agree with the above.

## 2018-01-25 ENCOUNTER — Telehealth: Payer: Self-pay | Admitting: Oncology

## 2018-01-25 ENCOUNTER — Inpatient Hospital Stay: Payer: Medicare Other | Attending: Oncology | Admitting: Oncology

## 2018-01-25 ENCOUNTER — Inpatient Hospital Stay: Payer: Medicare Other

## 2018-01-25 VITALS — BP 138/54 | HR 76 | Temp 98.4°F | Resp 18 | Ht 64.0 in | Wt 205.7 lb

## 2018-01-25 DIAGNOSIS — D0512 Intraductal carcinoma in situ of left breast: Secondary | ICD-10-CM

## 2018-01-25 DIAGNOSIS — C50911 Malignant neoplasm of unspecified site of right female breast: Secondary | ICD-10-CM

## 2018-01-25 DIAGNOSIS — Z170 Estrogen receptor positive status [ER+]: Secondary | ICD-10-CM | POA: Insufficient documentation

## 2018-01-25 DIAGNOSIS — Z79811 Long term (current) use of aromatase inhibitors: Secondary | ICD-10-CM

## 2018-01-25 DIAGNOSIS — C50912 Malignant neoplasm of unspecified site of left female breast: Secondary | ICD-10-CM

## 2018-01-25 DIAGNOSIS — C50411 Malignant neoplasm of upper-outer quadrant of right female breast: Secondary | ICD-10-CM

## 2018-01-25 LAB — COMPREHENSIVE METABOLIC PANEL
ALBUMIN: 3.6 g/dL (ref 3.5–5.0)
ALT: 17 U/L (ref 0–44)
AST: 16 U/L (ref 15–41)
Alkaline Phosphatase: 77 U/L (ref 38–126)
Anion gap: 8 (ref 5–15)
BUN: 17 mg/dL (ref 8–23)
CHLORIDE: 109 mmol/L (ref 98–111)
CO2: 26 mmol/L (ref 22–32)
Calcium: 9.1 mg/dL (ref 8.9–10.3)
Creatinine, Ser: 0.98 mg/dL (ref 0.44–1.00)
GFR calc Af Amer: >60 mL/min (ref >60)
GFR, EST NON AFRICAN AMERICAN: 59 mL/min — AB (ref >60)
Glucose, Bld: 103 mg/dL — ABNORMAL HIGH (ref 70–99)
POTASSIUM: 3.6 mmol/L (ref 3.5–5.1)
Sodium: 143 mmol/L (ref 135–145)
Total Bilirubin: 0.7 mg/dL (ref 0.3–1.2)
Total Protein: 6.5 g/dL (ref 6.5–8.1)

## 2018-01-25 LAB — CBC WITH DIFFERENTIAL/PLATELET
Abs Immature Granulocytes: 0.01 10*3/uL (ref 0.00–0.07)
BASOS ABS: 0 10*3/uL (ref 0.0–0.1)
Basophils Relative: 1 %
Eosinophils Absolute: 0.1 10*3/uL (ref 0.0–0.5)
Eosinophils Relative: 2 %
HCT: 39.1 % (ref 36.0–46.0)
HEMOGLOBIN: 12.9 g/dL (ref 12.0–15.0)
IMMATURE GRANULOCYTES: 0 %
Lymphocytes Relative: 31 %
Lymphs Abs: 1.4 10*3/uL (ref 0.7–4.0)
MCH: 30.4 pg (ref 26.0–34.0)
MCHC: 33 g/dL (ref 30.0–36.0)
MCV: 92.2 fL (ref 80.0–100.0)
MONO ABS: 0.4 10*3/uL (ref 0.1–1.0)
Monocytes Relative: 9 %
Neutro Abs: 2.5 10*3/uL (ref 1.7–7.7)
Neutrophils Relative %: 57 %
Platelets: 238 10*3/uL (ref 150–400)
RBC: 4.24 MIL/uL (ref 3.87–5.11)
RDW: 11.9 % (ref 11.5–15.5)
WBC: 4.3 10*3/uL (ref 4.0–10.5)
nRBC: 0 % (ref 0.0–0.2)

## 2018-01-25 MED ORDER — GABAPENTIN 300 MG PO CAPS: 300.0000 mg | ORAL_CAPSULE | Freq: Every day | ORAL | 4 refills | Status: DC

## 2018-01-25 NOTE — Telephone Encounter (Signed)
Gave pt avs and calendar  °

## 2018-01-27 ENCOUNTER — Ambulatory Visit
Admission: RE | Admit: 2018-01-27 | Discharge: 2018-01-27 | Disposition: A | Discharge status: Home / Self Care | Payer: Medicare Other | Source: Ambulatory Visit | Attending: Obstetrics & Gynecology | Admitting: Obstetrics & Gynecology

## 2018-01-27 DIAGNOSIS — E2839 Other primary ovarian failure: Secondary | ICD-10-CM

## 2018-02-16 ENCOUNTER — Ambulatory Visit
Admission: RE | Admit: 2018-02-16 | Discharge: 2018-02-16 | Disposition: A | Discharge status: Home / Self Care | Payer: Medicare Other | Source: Ambulatory Visit | Attending: Oncology | Admitting: Oncology

## 2018-02-16 DIAGNOSIS — Z170 Estrogen receptor positive status [ER+]: Principal | ICD-10-CM

## 2018-02-16 DIAGNOSIS — C50411 Malignant neoplasm of upper-outer quadrant of right female breast: Secondary | ICD-10-CM

## 2018-02-16 DIAGNOSIS — C50912 Malignant neoplasm of unspecified site of left female breast: Secondary | ICD-10-CM

## 2018-02-16 DIAGNOSIS — D0512 Intraductal carcinoma in situ of left breast: Secondary | ICD-10-CM

## 2018-02-16 MED ORDER — GADOBUTROL 1 MMOL/ML IV SOLN
9.0000 mL | Freq: Once | INTRAVENOUS | Status: AC | PRN
  Administered 2018-02-16: 9 mL via INTRAVENOUS

## 2018-02-17 ENCOUNTER — Encounter: Payer: Self-pay | Admitting: Oncology

## 2018-02-17 ENCOUNTER — Other Ambulatory Visit: Payer: Self-pay | Admitting: Oncology

## 2018-02-24 ENCOUNTER — Encounter: Payer: Self-pay | Admitting: Radiation Oncology

## 2018-02-24 ENCOUNTER — Ambulatory Visit
Admission: RE | Admit: 2018-02-24 | Discharge: 2018-02-24 | Disposition: A | Discharge status: Home / Self Care | Payer: Medicare Other | Source: Ambulatory Visit | Attending: Radiation Oncology | Admitting: Radiation Oncology

## 2018-02-24 ENCOUNTER — Other Ambulatory Visit: Payer: Self-pay

## 2018-02-24 VITALS — BP 156/66 | HR 74 | Temp 97.9°F | Resp 18 | Ht 61.0 in | Wt 206.0 lb

## 2018-02-24 DIAGNOSIS — Z79899 Other long term (current) drug therapy: Secondary | ICD-10-CM | POA: Diagnosis not present

## 2018-02-24 DIAGNOSIS — Z923 Personal history of irradiation: Secondary | ICD-10-CM | POA: Insufficient documentation

## 2018-02-24 DIAGNOSIS — D0512 Intraductal carcinoma in situ of left breast: Secondary | ICD-10-CM | POA: Insufficient documentation

## 2018-02-24 DIAGNOSIS — C50912 Malignant neoplasm of unspecified site of left female breast: Secondary | ICD-10-CM

## 2018-02-24 NOTE — Progress Notes (Signed)
Pt presents today for f/u with Dr. Sondra Come. Pt reports mild fatigue. Pt reports "soreness" in right axilla, rated 5/10. Pt is not using any cream on breast. Breast is slightly hyperpigmented.   Pt's husband passed away in 2023/02/11 of this year. Pt is dealing with grief/depression.  BP (!) 156/66 (BP Location: Left Arm, Patient Position: Sitting)   Pulse 74   Temp 97.9 F (36.6 C) (Oral)   Resp 18   Ht 5\' 1"  (1.549 m)   Wt 206 lb (93.4 kg)   LMP 03/17/1983   SpO2 100%   BMI 38.92 kg/m   Wt Readings from Last 3 Encounters:  02/24/18 206 lb (93.4 kg)  01/25/18 205 lb 11.2 oz (93.3 kg)  11/22/17 206 lb 6 oz (93.6 kg)   Loma Sousa, RN BSN

## 2018-02-24 NOTE — Progress Notes (Signed)
Radiation Oncology         [phone] ________________________________  Name: Katelyn Porter MRN: [id number]  Date: 02/24/2018  DOB: 1950/12/04  Follow-Up Visit Note  CC: [name], DO  Magrinat, Virgie Dad, MD    ICD-10-CM   1. 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 Her husband recently passed away from hemorraghic stroke on 01/15/18, which was not unexpected but still happened quickly. She became teary, and we expressed our condolences. She is dealing with grief and issues sleeping. She was prescribed gabapentin by Dr. [name], but she doesn't always remember to take it. She denies swelling but notes tenderness to the right nipple and axilla. She is experiencing hot flashes from the anastrozole. She will see her surgeon Dr. [name] in January 2020.   She underwent bilateral breast MRI on 02/16/18 showing no evidence of malignancy in either breast.                 ALLERGIES:  is allergic to crab [shellfish allergy]; sulfa antibiotics; lipitor [atorvastatin]; and sulfa drugs cross reactors.  Meds: Current Outpatient Medications  Medication Sig Dispense Refill  . anastrozole (ARIMIDEX) 1 MG tablet Take 1 tablet (1 mg total) by mouth daily. 90 tablet 4  . busPIRone (BUSPAR) 10 MG tablet Take 10 mg by mouth daily.    . Cholecalciferol (VITAMIN D3) 1000 units CAPS Take 1 capsule by mouth daily.    . DULoxetine (CYMBALTA) 60 MG capsule Take 60 mg by mouth daily.    Marland Kitchen gabapentin (NEURONTIN) 300 MG capsule Take 1 capsule (300 mg total) by mouth at bedtime. 90  capsule 4  . Multiple Vitamins-Minerals (ONE-A-DAY WOMENS 50 PLUS PO) Take 1 tablet by mouth daily.    . pantoprazole (PROTONIX) 40 MG tablet TAKE 1 TABLET BY MOUTH ONCE DAILY 90 tablet 1  . pravastatin (PRAVACHOL) 40 MG tablet Take 1 tablet (40 mg total) by mouth daily. 30 tablet 5  . triamterene-hydrochlorothiazide (DYAZIDE) 37.5-25 MG capsule Take by mouth.    . meclizine (ANTIVERT) 12.5 MG tablet Take 1 tablet (12.5 mg total) by mouth 3 (three) times daily as needed for dizziness. (Patient not taking: Reported on 02/24/2018) 20 tablet 0  . nitrofurantoin, macrocrystal-monohydrate, (MACROBID) 100 MG capsule 1 tab bid x 3 days with symptoms. (Patient not taking: Reported on 02/24/2018) 30 capsule 0   Current Facility-Administered Medications  Medication Dose Route Frequency Provider Last Rate Last Dose  . 0.9 %  sodium chloride infusion  500 mL Intravenous Once Irene Shipper, MD        Physical Findings: The patient is in no acute distress. Patient is alert and oriented.  height is 5' 1" (1.549 m) and weight is 206 lb (93.4 kg). Her oral temperature is 97.9 F (36.6 C). Her blood pressure is 156/66 (abnormal) and her pulse is 74. Her respiration is 18 and oxygen saturation is 100%.  Lungs are clear to auscultation bilaterally. Heart has regular rate and rhythm. No palpable cervical, supraclavicular, or axillary adenopathy. Abdomen soft, non-tender, normal bowel sounds. Left breast: no palpable masses, nipple discharge, or bleeding.  Right breast: patient continues to have hyperpigmentation changes and edema. No palpable mass, nipple discharge, or bleeding. Patient is sensitivity with palpation in the right axillary region, but no palpable abnormality.   Lab Findings: Lab Results  Component Value Date   WBC 4.3 01/25/2018   HGB 12.9 01/25/2018   HCT 39.1 01/25/2018   MCV 92.2 01/25/2018   PLT 238 01/25/2018    Radiographic Findings: Mr Breast Bilateral W Wo Contrast Inc Cad  Result  Date: 02/16/2018 CLINICAL DATA:  67 year old patient had right breast lumpectomy in May 2019 for estrogen receptor right breast cancer that measured 0.8 cm on ultrasound. Sentinel lymph node sampling was negative. She has completed radiation therapy. The patient has a history of left breast cancer diagnosed in 2009, treated with lumpectomy and radiation. LABS:  None obtained EXAM: BILATERAL BREAST MRI WITH AND WITHOUT CONTRAST TECHNIQUE: Multiplanar, multisequence MR images of both breasts were obtained prior to and following the intravenous administration of 9 ml of Gadavist Three-dimensional MR images were rendered by post-processing of the original MR data on an independent workstation. The three-dimensional MR images were interpreted, and findings are reported in the following complete MRI report for this study. Three dimensional images were evaluated at the independent DynaCad workstation COMPARISON:  Previous exam(s). FINDINGS: Breast composition: b. Scattered fibroglandular tissue. Background parenchymal enhancement: Mild Right breast: Increased vascularity to the right breast compared to the left and increased T2 weighted signal in the right breast parenchyma and right pectoralis muscle and mild skin thickening are consistent with recent treatment of right breast cancer. There are lumpectomy changes in the outer right breast. No suspicious enhancement or mass is identified in the right breast to suggest malignancy. Left breast: No mass or abnormal enhancement. Remote lumpectomy changes in the outer left breast. No abnormal T2 weighted signal or skin thickening. Lymph nodes: Postsurgical changes visualized in the right axilla. Negative for right axillary lymphadenopathy. Negative for left axillary lymphadenopathy. Negative for internal mammary chain lymphadenopathy. Ancillary findings:  None. IMPRESSION: No MRI evidence of malignancy in either breast. Changes in the right breast consistent with lumpectomy and  radiation therapy performed in 2019. Remote lumpectomy changes in the left breast. RECOMMENDATION: Bilateral diagnostic mammogram is recommended in April 2020. BI-RADS CATEGORY  2: Benign. Electronically Signed   By: Curlene Dolphin M.D.   On: 02/16/2018 16:52   Dg Bone Density (dxa)  Result Date: 01/27/2018 EXAM: DUAL X-RAY ABSORPTIOMETRY (DXA) FOR BONE MINERAL DENSITY IMPRESSION: Referring Physician:  Megan Salon Your patient completed a BMD test using Lunar IDXA DXA system ( analysis version: 16 ) manufactured by [employer]. Technologist: KT PATIENT: Name: Ranya, Fiddler Patient ID: [id number] Birth Date: 1950-10-08 Height: 61.7 in. Sex: Female Measured: 01/27/2018 Weight: 203.2 lbs. Indications: Anastrazole, Breast Cancer History, cymbalta, Depression, Estrogen Deficient, Height Loss (781.91), Hysterectomy, Pantoprazole, Postmenopausal Fractures: None Treatments: Calcium (E943.0), Vitamin D (E933.5) ASSESSMENT: The BMD measured at Femur Neck Right is 1.338 g/cm2 with a T-score of 2.2. This patient is considered NORMAL according to [facility]) criteria. The scan quality is good. L-3  was excluded due to degenerative changes. Site Region Measured Date Measured Age [age]A T-score BMD Significant CHANGE DualFemur Neck Right 01/27/2018    66.3         2.2     1.338 g/cm2 AP Spine  L1-L4 (L3) 01/27/2018    67.3         3.4     1.579 g/cm2 DualFemur Total Mean 01/27/2018  66.3         2.7     1.352 g/cm2 World Health Organization [facility]) criteria for post-menopausal, Caucasian Women: Normal       T-score at or above -1 SD Osteopenia   T-score between -1 and -2.5 SD Osteoporosis T-score at or below -2.5 SD RECOMMENDATION: 1. All patients should optimize calcium and vitamin D intake. 2. Consider FDA approved medical therapies in postmenopausal women and men aged 78 years and older, based on the following: a. A hip or vertebral (clinical or morphometric) fracture b. T- score < or = -2.5 at the  femoral neck or spine after appropriate evaluation to exclude secondary causes c. Low bone mass (T-score between -1.0 and -2.5 at the femoral neck or spine) and a 10 year probability of a hip fracture > or = 3% or a 10 year probability of a major osteoporosis-related fracture > or = 20% based on the [place]-adapted WHO algorithm d. Clinician judgment and/or patient preferences may indicate treatment for people with 10-year fracture probabilities above or below these levels FOLLOW-UP: People with diagnosed cases of osteoporosis or at high risk for fracture should have regular bone mineral density tests. For patients eligible for Medicare, routine testing is allowed once every 2 years. The testing frequency can be increased to one year for patients who have rapidly progressing disease, those who are receiving or discontinuing medical therapy to restore bone mass, or have additional risk factors. I have reviewed this report and agree with the above findings. Mark A. Thornton Papas, M.D. [facility] Electronically Signed   By: Lavonia Dana M.D.   On: 01/27/2018 16:09    Impression: Stage IA, pT1bN0,Right Breast IDC, with extracellular mucin, grade 2; ER+ / PR+ / Her-2 neg No evidence of recurrence on clinical exam or recent breast MRI. Patient continues to have some edema in the breast. We discussed physical therapy evaluation, but she decided to hold off on referral at this time due to stress from her husband passing away recently.  Plan:  PRN follow up with radiation oncology in light her continued close follow up with medical oncology and her surgeon.  ____________________________________   Blair Promise, PhD, MD   This document serves as a record of services personally performed by Gery Pray, MD. It was created on his behalf by Wilburn Mylar, a trained medical scribe. The creation of this record is based on the scribe's personal observations and the provider's statements to them. This document has  been checked and approved by the attending provider.

## 2018-03-02 ENCOUNTER — Other Ambulatory Visit: Payer: Self-pay | Admitting: Internal Medicine

## 2018-03-03 ENCOUNTER — Other Ambulatory Visit: Payer: Self-pay | Admitting: Internal Medicine

## 2018-03-11 NOTE — Telephone Encounter (Signed)
Called and spoke to pt. She has already picked up script from [facility] that was sent on 12-19.  She has a follow up appt with Dr. Henrene Pastor in January.

## 2018-03-29 ENCOUNTER — Encounter: Payer: Self-pay | Admitting: Internal Medicine

## 2018-03-29 ENCOUNTER — Ambulatory Visit (INDEPENDENT_AMBULATORY_CARE_PROVIDER_SITE_OTHER): Payer: Medicare Other | Admitting: Internal Medicine

## 2018-03-29 VITALS — BP 114/68 | HR 80 | Ht 62.0 in | Wt 203.2 lb

## 2018-03-29 DIAGNOSIS — K219 Gastro-esophageal reflux disease without esophagitis: Secondary | ICD-10-CM | POA: Diagnosis not present

## 2018-03-29 DIAGNOSIS — Z86010 Personal history of colonic polyps: Secondary | ICD-10-CM | POA: Diagnosis not present

## 2018-03-29 DIAGNOSIS — R140 Abdominal distension (gaseous): Secondary | ICD-10-CM | POA: Diagnosis not present

## 2018-03-29 MED ORDER — PANTOPRAZOLE SODIUM 40 MG PO TBEC: 40.0000 mg | DELAYED_RELEASE_TABLET | Freq: Every day | ORAL | 3 refills | Status: DC

## 2018-03-29 MED ORDER — METRONIDAZOLE 250 MG PO TABS: 250.0000 mg | ORAL_TABLET | Freq: Three times a day (TID) | ORAL | 0 refills | Status: DC

## 2018-03-29 NOTE — Progress Notes (Signed)
HISTORY OF PRESENT ILLNESS:  Katelyn Porter is a 68 y.o. female with past medical history as listed below who is followed in this office for chronic GERD, adenomatous colon polyps and a family history of colon cancer, and chronic abdominal bloating with increased intestinal gas.  The patient was last seen September 07, 2017 when she underwent surveillance colonoscopy.  She was found to have 4 adenomatous polyps including a 15 mm adenoma for which follow-up in 3 years was recommended.  Patient presents today for ongoing management of her chronic GERD.  She takes pantoprazole 40 mg daily.  On medication she has no reflux symptoms.  Off medication significant symptoms.  No dysphasia.  Patient has had chronic problems with abdominal bloating and gas.  Had similar complaints 2 years ago.  She tells me that this seems to be getting worse.  She describes postprandial fullness.  Increased flatus.  She has lost 10 pounds over the past 2 years as recommended.  She describes foul-smelling belching.  No bleeding.  No pain.  Review of outside blood work from November 2019 finds unremarkable comprehensive metabolic panel and CBC with hemoglobin 12.9.  Review of outside x-ray file shows abdominal ultrasound of the right upper quadrant December 2017 reveals fatty liver.  She did undergo upper endoscopy in February 2016 to evaluate epigastric pain and GERD.  There was evidence of esophagitis which was mild.  Otherwise normal exam.  REVIEW OF SYSTEMS:  All non-GI ROS negative unless otherwise stated in the HPI except for depression  Past Medical History:  Diagnosis Date  . Breast cancer ([place]) 2008   left, with radiation finished 08, tamoxifen  . Breast cancer ([place]) 2019   Rt. had lymph node removed  having radiation currently  . Complication of anesthesia    woke up with colonoscopy and hysterectomy  . Depression 01/24/2013  . Family history of breast cancer   . Family history of colon cancer   . GERD  (gastroesophageal reflux disease)   . Hyperlipidemia   . Hypertension   . IBS (irritable bowel syndrome)   . Palpitations   . Vertigo     Past Surgical History:  Procedure Laterality Date  . ABDOMINAL HYSTERECTOMY  1985   TAH (ovaries remain)  . BREAST LUMPECTOMY Left 2007  . BREAST LUMPECTOMY WITH RADIOACTIVE SEED AND SENTINEL LYMPH NODE BIOPSY Right 07/22/2017   Procedure: BREAST LUMPECTOMY WITH RADIOACTIVE SEED AND SENTINEL LYMPH NODE BIOPSY;  Surgeon: Erroll Luna, MD;  Location: [place];  Service: General;  Laterality: Right;  . COLONOSCOPY    . TONSILLECTOMY     age 49    Social History Katelyn Porter  reports that she has never smoked. She has never used smokeless tobacco. She reports that she does not drink alcohol or use drugs.  family history includes Breast cancer in her cousin and maternal aunt; Colon cancer in her cousin; Colon cancer (age of onset: 48) in her mother; Colon polyps in her sister; Coronary artery disease (age of onset: 54) in her brother; Diabetes in her brother, mother, and sister; Heart disease in her father; Hypertension in her mother and sister; Kidney disease in her mother; Lung cancer in her father; Ovarian cancer in her paternal grandmother.  Allergies  Allergen Reactions  . Crab [Shellfish Allergy] Swelling    Blisters on tongue.  . Sulfa Antibiotics Other (See Comments)    headache  . Lipitor [Atorvastatin]     Muscle cramping  . Sulfa Drugs Cross  Reactors        PHYSICAL EXAMINATION: Vital signs: BP 114/68   Pulse 80   Ht 5\' 2"  (1.575 m)   Wt 203 lb 3.2 oz (92.2 kg)   LMP 03/17/1983   BMI 37.17 kg/m   Constitutional: generally well-appearing, no acute distress Psychiatric: alert and oriented x3, cooperative Eyes: extraocular movements intact, anicteric, conjunctiva pink Mouth: oral pharynx moist, no lesions Neck: supple no lymphadenopathy Cardiovascular: heart regular rate and rhythm, no murmur Lungs: clear  to auscultation bilaterally Abdomen: soft, obese, nontender, nondistended, no obvious ascites, no peritoneal signs, normal bowel sounds, no organomegaly Rectal: Omitted Extremities: no clubbing, cyanosis, or lower extremity edema bilaterally Skin: no lesions on visible extremities Neuro: No focal deficits.  Cranial nerves intact  ASSESSMENT:  1.  GERD.  Symptoms controlled on pantoprazole 2.  Abdominal bloating and increased intestinal gas.  Rule out bacterial overgrowth. 3.  Morbid obesity 4.  History of adenomatous colon polyps.   PLAN:  1.  Reflux precautions 2.  Discussion on increased intestinal gas 3.  Provided literature on intestinal gas as well as anti-gas and flatulence dietary sheet/booklet 4.  Refill pantoprazole 40 mg daily.  1 year worth of refills 5.  Advised with regards to risks associated with chronic PPI use 6.  Prescribe metronidazole 250 mg 3 times daily for 10 days.  Side effects reviewed 7.  Surveillance colonoscopy around June 2022 8.  Routine GI office follow-up 1 year.  Sooner if needed  25-minute spent face-to-face with the patient.  Greater than 50% the time used for counseling regarding her GERD, bloating with increased intestinal gas, and treatment regimen as outlined

## 2018-03-29 NOTE — Patient Instructions (Signed)
We have sent the following medications to your pharmacy for you to pick up at your convenience: Flagyl, Pantoprazole  Please follow up in one year

## 2018-04-07 ENCOUNTER — Telehealth: Payer: Self-pay | Admitting: *Deleted

## 2018-04-07 MED ORDER — GABAPENTIN 100 MG PO CAPS: 100.0000 mg | ORAL_CAPSULE | Freq: Every day | ORAL | 3 refills | Status: DC

## 2018-04-07 NOTE — Telephone Encounter (Signed)
Pt called wanting to known if MD could decrease her dose of gabapentin to 100 mg instead of the 300 mg due to ongoing drowsiness.  Pharmacy verified - new prescription left.

## 2018-04-21 ENCOUNTER — Encounter: Payer: Medicare Other | Admitting: Adult Health

## 2018-04-21 DIAGNOSIS — T451X5A Adverse effect of antineoplastic and immunosuppressive drugs, initial encounter: Secondary | ICD-10-CM

## 2018-04-21 DIAGNOSIS — R232 Flushing: Secondary | ICD-10-CM | POA: Insufficient documentation

## 2018-04-21 HISTORY — DX: Adverse effect of antineoplastic and immunosuppressive drugs, initial encounter: T45.1X5A

## 2018-05-11 ENCOUNTER — Telehealth: Payer: Self-pay

## 2018-05-11 NOTE — Telephone Encounter (Signed)
LVM for patient reminding of SCP visit with NP on 05/18/18 at 10 am.  Center number left for patient for call back with questions.

## 2018-05-18 ENCOUNTER — Inpatient Hospital Stay: Payer: Medicare Other | Admitting: Adult Health

## 2018-05-30 ENCOUNTER — Telehealth: Payer: Self-pay | Admitting: Physician Assistant

## 2018-05-30 NOTE — Telephone Encounter (Signed)
A user error has taken place: ERROR °

## 2018-05-31 ENCOUNTER — Encounter: Payer: Self-pay | Admitting: Physician Assistant

## 2018-05-31 ENCOUNTER — Other Ambulatory Visit: Payer: Self-pay

## 2018-05-31 ENCOUNTER — Ambulatory Visit (INDEPENDENT_AMBULATORY_CARE_PROVIDER_SITE_OTHER): Payer: Medicare Other | Admitting: Physician Assistant

## 2018-05-31 VITALS — BP 106/70 | HR 66 | Temp 98.5°F | Ht 64.0 in | Wt 210.0 lb

## 2018-05-31 DIAGNOSIS — R143 Flatulence: Secondary | ICD-10-CM

## 2018-05-31 DIAGNOSIS — R140 Abdominal distension (gaseous): Secondary | ICD-10-CM | POA: Diagnosis not present

## 2018-05-31 DIAGNOSIS — R194 Change in bowel habit: Secondary | ICD-10-CM

## 2018-05-31 NOTE — Patient Instructions (Signed)
If you are age 67 or older, your body mass index should be between 23-30. Your Body mass index is 36.05 kg/m. If this is out of the aforementioned range listed, please consider follow up with your Primary Care Provider.  If you are age 40 or younger, your body mass index should be between 19-25. Your Body mass index is 36.05 kg/m. If this is out of the aformentioned range listed, please consider follow up with your Primary Care Provider.   Start IBgard - you have been given samples.  Take 2 tablets twice daily for one month, then 1 tablet twice daily.  You can buy this over-the-counter.  Limit / Stop dairy.  Follow up with me in 2-3 months.  The schedule is not available at this time.  Please call the office in a couple of weeks to make an appointment.  Thank you for choosing me and [facility].    Ellouise Newer, PA-C   To help prevent the possible spread of infection to our patients, communities, and staff; we will be implementing the following measures:  Please only allow one visitor/family member to accompany you to any upcoming appointments with [facility]. If you have any concerns about this please contact our office to discuss prior to the appointment.

## 2018-05-31 NOTE — Progress Notes (Signed)
Assessment and plan reviewed 

## 2018-05-31 NOTE — Progress Notes (Signed)
Chief Complaint: Diarrhea, bloating and gas  HPI:    Katelyn Porter is a 68 year old African-American female with a past medical history as listed below, known to Dr. Henrene Pastor, who was referred to me by [name], DO for a complaint of diarrhea, bloating and gas.     04/2014 EGD showed evidence of esophagitis.    09/07/2017 colonoscopy with 4 adenomatous polyps including a 15 mm adenoma for which follow-up was recommended in 3 years.    03/29/2018 office visit with Dr. Henrene Pastor.  At that time described chronic abdominal bloating with increased gas.  Patient also discussed that her reflux is controlled on Pantoprazole 40 mg daily.  Her main complaint was of gas which had been increasing.  She described postprandial fullness.  Weight loss of 10 pounds over the past 2 years.  Foul-smelling belching.  Blood work November 2019 unremarkable including CMP and CBC.  Right upper quadrant ultrasound showed fatty liver in December 2017.  At that time surgery was given about intestinal gas as well as anti-gas diet.  Refilled pantoprazole 40 daily.  Prescribed Metronidazole 250 3 times daily for 10 days to consider bacterial overgrowth.    Today, patient presents clinic and explains that she took the Flagyl 3 times daily x1 week and this helped until about 3 weeks ago.  Now she complains of bloating and gas which seems to build throughout the day "by the end of the day I can rest my hands on my stomach like I am pregnant".  Along with this has noticed that her bowel habits have changed.  She is now describing soft/formed pieces of stool which are slightly more frequent throughout the day, they are not liquid or runny.  She has been started on some recent medicines including Clonidine and Rosuvastatin for hot flashes per her, but symptoms started prior to initiating these medicines.  Does try to limit dairy but still eats some.    Denies fever, chills, blood in her stool, weight loss, nausea, vomiting, heartburn or reflux.   Past Medical History:  Diagnosis Date  . Breast cancer ([place]) 2008   left, with radiation finished 08, tamoxifen  . Breast cancer ([place]) 2019   Rt. had lymph node removed  having radiation currently  . Complication of anesthesia    woke up with colonoscopy and hysterectomy  . Depression 01/24/2013  . Family history of breast cancer   . Family history of colon cancer   . GERD (gastroesophageal reflux disease)   . Hyperlipidemia   . Hypertension   . IBS (irritable bowel syndrome)   . Palpitations   . Vertigo     Past Surgical History:  Procedure Laterality Date  . ABDOMINAL HYSTERECTOMY  1985   TAH (ovaries remain)  . BREAST LUMPECTOMY Left 2007  . BREAST LUMPECTOMY WITH RADIOACTIVE SEED AND SENTINEL LYMPH NODE BIOPSY Right 07/22/2017   Procedure: BREAST LUMPECTOMY WITH RADIOACTIVE SEED AND SENTINEL LYMPH NODE BIOPSY;  Surgeon: Erroll Luna, MD;  Location: [facility];  Service: General;  Laterality: Right;  . COLONOSCOPY    . TONSILLECTOMY     age 65    Current Outpatient Medications  Medication Sig Dispense Refill  . Cholecalciferol (VITAMIN D3) 1000 units CAPS Take 1 capsule by mouth daily.    . DULoxetine (CYMBALTA) 60 MG capsule Take 30 mg by mouth 2 (two) times daily.     Marland Kitchen gabapentin (NEURONTIN) 100 MG capsule Take 1 capsule (100 mg total) by mouth at bedtime. [place]  capsule 3  . Multiple Vitamins-Minerals (ONE-A-DAY WOMENS 50 PLUS PO) Take 1 tablet by mouth daily.    . pantoprazole (PROTONIX) 40 MG tablet Take 1 tablet (40 mg total) by mouth daily. 90 tablet 3  . pravastatin (PRAVACHOL) 40 MG tablet Take 1 tablet (40 mg total) by mouth daily. 30 tablet 5  . triamterene-hydrochlorothiazide (DYAZIDE) 37.5-25 MG capsule Take by mouth.     Current Facility-Administered Medications  Medication Dose Route Frequency Provider Last Rate Last Dose  . 0.9 %  sodium chloride infusion  500 mL Intravenous Once Irene Shipper, MD        Allergies as of 05/31/2018 -  Review Complete 05/31/2018  Allergen Reaction Noted  . Crab [shellfish allergy] Swelling 06/22/2012  . Sulfa antibiotics Other (See Comments) 07/03/2014  . Lipitor [atorvastatin]  02/03/2013  . Sulfa drugs cross reactors  07/22/2010    Family History  Problem Relation Age of Onset  . Lung cancer Father        Died at 76 from lung cancer  . Heart disease Father        heart attack  . Colon cancer Mother 55  . Hypertension Mother   . Kidney disease Mother   . Diabetes Mother   . Hypertension Sister   . Diabetes Sister   . Colon polyps Sister   . Breast cancer Maternal Aunt        dx in her 71s  . Coronary artery disease Brother 25       died of "massive heart attack"  . Diabetes Brother   . Colon cancer Cousin        maternal 1st cousin dx over 47  . Ovarian cancer Paternal Grandmother   . Breast cancer Cousin        mat 1st cousin dx in her 48s  . Esophageal cancer Neg Hx   . Stomach cancer Neg Hx   . Rectal cancer Neg Hx     Social History   Socioeconomic History  . Marital status: Married    Spouse name: Not on file  . Number of children: 0  . Years of education: Not on file  . Highest education level: Not on file  Occupational History  . Occupation: accounts payable    Employer: [employer]  . Financial resource strain: Not on file  . Food insecurity:    Worry: Not on file    Inability: Not on file  . Transportation needs:    Medical: Not on file    Non-medical: Not on file  Tobacco Use  . Smoking status: Never Smoker  . Smokeless tobacco: Never Used  Substance and Sexual Activity  . Alcohol use: No  . Drug use: No  . Sexual activity: Not Currently    Birth control/protection: Surgical  Lifestyle  . Physical activity:    Days per week: Not on file    Minutes per session: Not on file  . Stress: Not on file  Relationships  . Social connections:    Talks on phone: Not on file    Gets together: Not on file    Attends  religious service: Not on file    Active member of club or organization: Not on file    Attends meetings of clubs or organizations: Not on file    Relationship status: Not on file  . Intimate partner violence:    Fear of current or ex partner: Not on file    Emotionally  abused: Not on file    Physically abused: Not on file    Forced sexual activity: Not on file  Other Topics Concern  . Not on file  Social History Narrative   No children   Married   Enjoys quilting and horseback riding.    She works in Pension scheme manager.   She completed 2 years of college.     Review of Systems:    Constitutional: No weight loss, fever or chills Cardiovascular: No chest pain Respiratory: No SOB  Gastrointestinal: See HPI and otherwise negative   Physical Exam:  Vital signs: BP 106/70   Pulse 66   Ht 5\' 4"  (1.626 m)   Wt 210 lb (95.3 kg)   LMP 03/17/1983   SpO2 98%   BMI 36.05 kg/m   Constitutional:   Pleasant AA female appears to be in NAD, Well developed, Well nourished, alert and cooperative Respiratory: Respirations even and unlabored. Lungs clear to auscultation bilaterally.   No wheezes, crackles, or rhonchi.  Cardiovascular: Normal S1, S2. No MRG. Regular rate and rhythm. No peripheral edema, cyanosis or pallor.  Gastrointestinal:  Soft, nondistended, nontender. No rebound or guarding. Normal bowel sounds. No appreciable masses or hepatomegaly. Rectal:  Not performed.  Psychiatric: Demonstrates good judgement and reason without abnormal affect or behaviors.  No recent labs.  Assessment: 1.  Abdominal bloating/gas: Most likely related to diet +/- IBS 2.  Change in bowel habits: Towards softer stools over the past 3 to 4 weeks; likely with above  Plan: 1.  Recommend the patient start IBgard 2 tabs twice daily x1 month, then 1 tab twice daily.  Provided her with samples. 2.  Would recommend that she stop dairy 3.  We will try above, if this is unhelpful for the patient could try  an antispasmodic such as Hyoscyamine or Dicyclomine.  Patient was thankful to not be prescribed any medications today.  Could also try Rifaximin/repeat therapy for SIBO as this seemed to work for period of time. 4.  Patient to follow in clinic in 2 to 3 months with me.  Ellouise Newer, PA-C [facility] 05/31/2018, 8:43 AM  Cc: [name], DO

## 2018-06-23 ENCOUNTER — Other Ambulatory Visit: Payer: Self-pay

## 2018-06-23 ENCOUNTER — Ambulatory Visit
Admission: RE | Admit: 2018-06-23 | Discharge: 2018-06-23 | Disposition: A | Discharge status: Home / Self Care | Payer: Medicare Other | Source: Ambulatory Visit | Attending: Oncology | Admitting: Oncology

## 2018-06-23 DIAGNOSIS — Z170 Estrogen receptor positive status [ER+]: Principal | ICD-10-CM

## 2018-06-23 DIAGNOSIS — C50912 Malignant neoplasm of unspecified site of left female breast: Secondary | ICD-10-CM

## 2018-06-23 DIAGNOSIS — D0512 Intraductal carcinoma in situ of left breast: Secondary | ICD-10-CM

## 2018-06-23 DIAGNOSIS — C50411 Malignant neoplasm of upper-outer quadrant of right female breast: Secondary | ICD-10-CM

## 2018-07-07 ENCOUNTER — Ambulatory Visit: Payer: Medicare Other | Admitting: Internal Medicine

## 2018-08-03 ENCOUNTER — Telehealth: Payer: Self-pay

## 2018-08-03 NOTE — Telephone Encounter (Signed)
Screened patient over phone for televisit tomorrow.

## 2018-08-04 ENCOUNTER — Ambulatory Visit (INDEPENDENT_AMBULATORY_CARE_PROVIDER_SITE_OTHER): Payer: Medicare Other | Admitting: Internal Medicine

## 2018-08-04 ENCOUNTER — Telehealth: Payer: Self-pay

## 2018-08-04 ENCOUNTER — Other Ambulatory Visit: Payer: Self-pay

## 2018-08-04 ENCOUNTER — Encounter: Payer: Self-pay | Admitting: Internal Medicine

## 2018-08-04 VITALS — Ht 64.0 in | Wt 210.0 lb

## 2018-08-04 DIAGNOSIS — K219 Gastro-esophageal reflux disease without esophagitis: Secondary | ICD-10-CM | POA: Diagnosis not present

## 2018-08-04 DIAGNOSIS — R140 Abdominal distension (gaseous): Secondary | ICD-10-CM | POA: Diagnosis not present

## 2018-08-04 MED ORDER — METRONIDAZOLE 250 MG PO TABS: 250.0000 mg | ORAL_TABLET | Freq: Three times a day (TID) | ORAL | 0 refills | Status: DC

## 2018-08-04 NOTE — Telephone Encounter (Signed)
Scheduled patient's gastric emptying scan at [facility] for 08/15/2018 at 9:30am.  Called patient and lm on vm the pertinent information and also said I will mail the AVS with the the instructions from Dr. Henrene Pastor.

## 2018-08-04 NOTE — Addendum Note (Signed)
Addended by: Audrea Muscat on: 08/04/2018 11:46 AM   Modules accepted: Orders

## 2018-08-04 NOTE — Progress Notes (Signed)
HISTORY OF PRESENT ILLNESS:  Katelyn Porter is a 68 y.o. female with past medical history as listed below who is followed in this office for chronic GERD and chronic problems with bloating and occasional bowel irregularities.  She also has a history of adenomatous colon polyps.  I last saw the patient March 29, 2018 with a chief complaint of increased flatus and abdominal bloating.  She was prescribed metronidazole which she states was quite helpful.  She was seen by the GI physician assistant in follow-up May 31, 2018.  Recurrent complaints at that time.  States that IBgard was questionably helpful.  She sets up this telehealth visit in the midst of the coronavirus pandemic with chief complaint of abdominal bloating.  Her symptoms are mostly postprandial.  She has no issues overnight or when she awakes in the morning.  She describes a sensation that she has not emptied properly.  In terms of her bowel movements, she describes daily soft bowel movements.  No bleeding.  No true abdominal pain just bloating discomfort.  She remains overweight.  Upper endoscopy February 2016 revealed mild esophagitis but was otherwise normal.  Abdominal ultrasound December 2017 revealed fatty liver.  Her last colonoscopy June 2019 revealed multiple adenomatous colon polyps for which follow-up in 3 years was recommended.  In terms of her GERD, she continues on pantoprazole.  No active symptoms at this time.  She tells me that she is watching the anti-gas and flatulence dietary measures which helps somewhat.  REVIEW OF SYSTEMS:  All non-GI ROS negative unless otherwise stated in the HPI except for depression  Past Medical History:  Diagnosis Date  . Breast cancer ([place]) 2008   left, with radiation finished 08, tamoxifen  . Breast cancer ([place]) 2019   Rt. had lymph node removed  having radiation currently  . Complication of anesthesia    woke up with colonoscopy and hysterectomy  . Depression 01/24/2013  . Family  history of breast cancer   . Family history of colon cancer   . GERD (gastroesophageal reflux disease)   . Hyperlipidemia   . Hypertension   . IBS (irritable bowel syndrome)   . Palpitations   . Vertigo     Past Surgical History:  Procedure Laterality Date  . ABDOMINAL HYSTERECTOMY  1985   TAH (ovaries remain)  . BREAST LUMPECTOMY Left 2007  . BREAST LUMPECTOMY Right 2019  . BREAST LUMPECTOMY WITH RADIOACTIVE SEED AND SENTINEL LYMPH NODE BIOPSY Right 07/22/2017   Procedure: BREAST LUMPECTOMY WITH RADIOACTIVE SEED AND SENTINEL LYMPH NODE BIOPSY;  Surgeon: Erroll Luna, MD;  Location: [facility];  Service: General;  Laterality: Right;  . COLONOSCOPY    . TONSILLECTOMY     age 62    Social History Jamoni Broadfoot  reports that she has never smoked. She has never used smokeless tobacco. She reports that she does not drink alcohol or use drugs.  family history includes Breast cancer in her cousin and maternal aunt; Colon cancer in her cousin; Colon cancer (age of onset: 63) in her mother; Colon polyps in her sister; Coronary artery disease (age of onset: 23) in her brother; Diabetes in her brother, mother, and sister; Heart disease in her father; Hypertension in her mother and sister; Kidney disease in her mother; Lung cancer in her father; Ovarian cancer in her paternal grandmother.  Allergies  Allergen Reactions  . Crab [Shellfish Allergy] Swelling    Blisters on tongue.  . Sulfa Antibiotics Other (See Comments)  headache  . Lipitor [Atorvastatin]     Muscle cramping  . Sulfa Drugs Cross Reactors        PHYSICAL EXAMINATION: No physical examination with telehealth visit   ASSESSMENT:  1.  Chronic postprandial bloating discomfort.  Likely functional.  Possible bacterial overgrowth with prior reported transient response to metronidazole.  Exacerbated by obesity.  Rule out gastroparesis. 2.  GERD.  Endoscopic evidence of mild esophagitis.  Currently  asymptomatic on PPI therapy 3.  Personal history of adenomatous colon polyps and family history of colon cancer.  Last colonoscopy June 2019 as described 4.  Morbid obesity.  Ongoing   PLAN:  1.  PRESCRIBE METRONIDAZOLE 250 mg 3 times daily for 2 weeks; #42; no refills 2.  SCHEDULE SOLID PHASE GASTRIC EMPTYING SCAN "rule out gastroparesis".  We will contact patient with results when available 3.  Reflux precautions 4.  Continue PPI.  Medication risks reviewed 5.  Weight loss and exercise.  Stressed. 6.  Continue with anti-gas and flatulence dietary measures 7.  GI office follow-up in 3 months.  Contact the office in the interim for questions or problems  This telehealth medicine visit was initiated by the patient who consented for this encounter.  She was in her home and I was in my office during the encounter.  She understands that there may be an associated professional charge for this service.

## 2018-08-04 NOTE — Patient Instructions (Signed)
1.  PRESCRIBE METRONIDAZOLE 250 mg 3 times daily for 2 weeks; #42; no refills - I have sent this to your pharmacy electronically.  2.  You have been scheduled for a gastric emptying scan at [facility] on 08/15/2018 at 9:30am. Please arrive at least 15 minutes prior to your appointment for registration. Please make certain not to have anything to eat or drink after midnight the night before your test. Hold your Protonix after midnight. If you need to reschedule your appointment, please contact radiology scheduling at [phone].  A gastric-emptying study measures how long it takes for food to move through your stomach. There are several ways to measure stomach emptying. In the most common test, you eat food that contains a small amount of radioactive material. A scanner that detects the movement of the radioactive material is placed over your abdomen to monitor the rate at which food leaves your stomach. This test normally takes about 4 hours to complete.  We will contact patient with results when available  3.  Reflux precautions 4.  Continue PPI.  Medication risks reviewed 5.  Weight loss and exercise.  Stressed. 6.  Continue with anti-gas and flatulence dietary measures 7.  GI office follow-up in 3 months.  Please call our office next month to schedule this.  Contact the office in the interim for questions or problems.

## 2018-08-15 ENCOUNTER — Telehealth: Payer: Self-pay

## 2018-08-15 ENCOUNTER — Encounter (HOSPITAL_COMMUNITY)
Admission: RE | Admit: 2018-08-15 | Discharge: 2018-08-15 | Disposition: A | Discharge status: Home / Self Care | Payer: Medicare Other | Source: Ambulatory Visit | Attending: Internal Medicine | Admitting: Internal Medicine

## 2018-08-15 ENCOUNTER — Other Ambulatory Visit: Payer: Self-pay

## 2018-08-15 DIAGNOSIS — R140 Abdominal distension (gaseous): Secondary | ICD-10-CM | POA: Diagnosis present

## 2018-08-15 DIAGNOSIS — K219 Gastro-esophageal reflux disease without esophagitis: Secondary | ICD-10-CM

## 2018-08-15 MED ORDER — TECHNETIUM TC 99M SULFUR COLLOID
2.0000 | Freq: Once | INTRAVENOUS | Status: AC | PRN
  Administered 2018-08-15: 2 via ORAL

## 2018-08-22 NOTE — Progress Notes (Signed)
[place]  [phone] [phone]     ID: Katelyn Porter DOB: Feb 05, 1951  MR#: [id number]  BSW#:[id number]  Patient Care Team: [name], DO as PCP - General (Internal Medicine) Megan Salon, MD as Attending Physician (Obstetrics and Gynecology) Sharanda Shinault, Virgie Dad, MD as Consulting Physician (Oncology) Erroll Luna, MD as Consulting Physician (General Surgery) Gery Pray, MD as Consulting Physician (Radiation Oncology) Irene Shipper, MD as Consulting Physician (Gastroenterology) OTHER MD:  CHIEF COMPLAINT: Estrogen receptor positive breast cancer  CURRENT TREATMENT: Anastrozole   INTERVAL HISTORY: Katelyn Porter returns today for follow-up and treatment of her estrogen receptor positive breast cancer.   She continues on anastrozole with fair tolerance. She reports hot flashes. She pays $4-5 for a month supply.  Since her last visit, she underwent bilateral diagnostic mammography with tomography at [facility] on 06/23/2018 showing: breast density category C; no evidence of malignancy in either breast.   REVIEW OF SYSTEMS: Katelyn Porter reports feeling tired today. She states she wakes up at night because her legs are moving. She has been using her treadmill 15 minutes. She met with Dr. Henrene Pastor for her stomach issues. He recommended a diet, which she has been on for about 3 weeks and has helped, she thinks. She also notes numbness to her right hand. She has still been dealing with the loss of her husband, which has been hard for her. She now lives alone.  The patient denies unusual headaches, visual changes, nausea, vomiting, stiff neck, dizziness, or gait imbalance. There has been no cough, phlegm production, or pleurisy, no chest pain or pressure, and no change in bowel or bladder habits. The patient denies fever, rash, bleeding, unexplained fatigue or unexplained weight loss. A detailed review of systems was otherwise entirely negative.    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  She was treated with surgery radiation and tamoxifen for 5 years and we released her from follow-up in 2013.  More recently she had routine screening mammography, on 06/16/2017 showing a possible abnormality in the right breast. She underwent unilateral right breast diagnostic mammography with tomography and right breast ultrasonography at [facility] on 06/21/2017 showing breast density category B. Suspicious new mass in the right breast 9 o'clock central and 4 cm from the nipple measuring 0.8 x 0.6 x 0.7 cm. There was no axillary adenopathy sonographically.   Accordingly on 06/22/2017 she proceeded to biopsy of the right breast area in question. The pathology from this procedure showed ([id number]): Breast, right, needle core biopsy, 9:15 o'clock, UOQ with invasive ductal carcinoma with extracellular mucin, grade 2. Prognostic indicators significant for: estrogen receptor, 100% positive and progesterone receptor, 5% positive, both with strong staining intensity. Proliferation marker Ki67 at 20%. HER2 negative.  The patient's subsequent history is as detailed below.   PAST MEDICAL HISTORY: Past Medical History:  Diagnosis Date  . Breast cancer ([place]) 2008   left, with radiation finished 08, tamoxifen  . Breast cancer ([facility]) 2019   Rt. had lymph node removed  having radiation currently  . Complication of anesthesia    woke up with colonoscopy and hysterectomy  . Depression 01/24/2013  . Family history of breast cancer   . Family history of colon cancer   . GERD (gastroesophageal reflux disease)   . Hyperlipidemia   . Hypertension   . IBS (irritable bowel syndrome)   .  Palpitations   . Vertigo     PAST SURGICAL HISTORY: Past Surgical History:  Procedure Laterality Date  . ABDOMINAL HYSTERECTOMY  1985   TAH (ovaries remain)  .  BREAST LUMPECTOMY Left 2007  . BREAST LUMPECTOMY Right 2019  . BREAST LUMPECTOMY WITH RADIOACTIVE SEED AND SENTINEL LYMPH NODE BIOPSY Right 07/22/2017   Procedure: BREAST LUMPECTOMY WITH RADIOACTIVE SEED AND SENTINEL LYMPH NODE BIOPSY;  Surgeon: Erroll Luna, MD;  Location: [facility];  Service: General;  Laterality: Right;  . COLONOSCOPY    . TONSILLECTOMY     age 59    FAMILY HISTORY Family History  Problem Relation Age of Onset  . Lung cancer Father        Died at 43 from lung cancer  . Heart disease Father        heart attack  . Colon cancer Mother 88  . Hypertension Mother   . Kidney disease Mother   . Diabetes Mother   . Hypertension Sister   . Diabetes Sister   . Colon polyps Sister   . Breast cancer Maternal Aunt        dx in her 48s  . Coronary artery disease Brother 1       died of "massive heart attack"  . Diabetes Brother   . Colon cancer Cousin        maternal 1st cousin dx over 67  . Ovarian cancer Paternal Grandmother   . Breast cancer Cousin        mat 1st cousin dx in her 16s  . Esophageal cancer Neg Hx   . Stomach cancer Neg Hx   . Rectal cancer Neg Hx   The patient's father had a history of tongue cancer which apparently spread to his lungs according to the patient. He passed due to a MI at age 107. The patient's mother died at age 43 due to renal failure and diabetes.  She also had a history of colon cancer. The patient had 1 brother who died from an MI; and 1 sister.  There was a maternal aunt diagnosed with breast cancer in the 51's. A maternal cousin passed due to breast cancer. Another maternal cousin had colon cancer. She denies a family history of ovarian cancer.    GYNECOLOGIC HISTORY:  Patient's last menstrual period was 03/17/1983. Menarche: 68 years old. She is GXP0. Her LMP was age 46 after her hysterectomy with BSO. She took HRT until 2008, discontinued at the time of her initial breast cancer diagnosis.    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 Her husband, Marjory Lies, worked in [employer] before retiring. He passed away 2018-02-02 from a hemorraghic stroke. He had 2 children from a prior marriage, a daughter Zigmund Daniel, who lives in [place] and works for [employer], and a son, Personnel officer, who works for the [employer] in [place], [place]. The patient has 3 step-grandchildren. She now lives alone. She attends [employer].     ADVANCED DIRECTIVES:    HEALTH MAINTENANCE: Social History   Tobacco Use  . Smoking status: Never Smoker  . Smokeless tobacco: Never Used  Substance Use Topics  . Alcohol use: No  . Drug use: No     Colonoscopy: UTD/Dr. Perry/ normal  PAP: s/p TAHBSO  Bone density:   Allergies  Allergen Reactions  . Crab [Shellfish Allergy] Swelling    Blisters on tongue.  . Sulfa  Antibiotics Other (See Comments)    headache  . Lipitor [Atorvastatin]     Muscle cramping  . Sulfa Drugs Cross Reactors     Current Outpatient Medications  Medication Sig Dispense Refill  . Cholecalciferol (VITAMIN D3) 1000 units CAPS Take 1 capsule by mouth daily.    . DULoxetine (CYMBALTA) 60 MG capsule Take 30 mg by mouth 2 (two) times daily.     Marland Kitchen gabapentin (NEURONTIN) 100 MG capsule Take 1 capsule (100 mg total) by mouth at bedtime. 30 capsule 3  . metroNIDAZOLE (FLAGYL) 250 MG tablet Take 1 tablet (250 mg total) by mouth 3 (three) times daily. 42 tablet 0  . Multiple Vitamins-Minerals (ONE-A-DAY WOMENS 50 PLUS PO) Take 1 tablet by mouth daily.    . pantoprazole (PROTONIX) 40 MG tablet Take 1 tablet (40 mg total) by mouth daily. 90 tablet 3  . pravastatin (PRAVACHOL) 40 MG tablet Take 1 tablet (40 mg total) by mouth daily. 30 tablet 5  . triamterene-hydrochlorothiazide (DYAZIDE) 37.5-25 MG capsule Take by mouth.     Current Facility-Administered Medications  Medication Dose Route Frequency  Provider Last Rate Last Dose  . 0.9 %  sodium chloride infusion  500 mL Intravenous Once Irene Shipper, MD        OBJECTIVE: Middle-aged African-American woman in no acute distress  Vitals:   08/23/18 1032  BP: (!) 130/52  Pulse: 71  Resp: 18  Temp: 98.5 F (36.9 C)  SpO2: 99%     Body mass index is 35.79 kg/m.   Wt Readings from Last 3 Encounters:  08/23/18 208 lb 8 oz (94.6 kg)  08/04/18 210 lb (95.3 kg)  05/31/18 210 lb (95.3 kg)      ECOG FS:1 - Symptomatic but completely ambulatory  Sclerae unicteric, EOMs intact Wearing a mask No cervical or supraclavicular adenopathy Lungs no rales or rhonchi Heart regular rate and rhythm Abd soft, nontender, positive bowel sounds MSK no focal spinal tenderness, no upper extremity lymphedema Neuro: nonfocal, well oriented, appropriate affect Breasts: The right breast is status post lumpectomy and radiation.  There is no evidence of local recurrence.  There is the expected darkening of the skin and coarsening.  Left breast is benign.  Both axillae are benign.  LAB RESULTS:  CMP     Component Value Date/Time   NA 142 08/23/2018 1006   K 4.0 08/23/2018 1006   CL 107 08/23/2018 1006   CO2 26 08/23/2018 1006   GLUCOSE 108 (H) 08/23/2018 1006   BUN 14 08/23/2018 1006   CREATININE 0.94 08/23/2018 1006   CREATININE 0.94 04/13/2013 1411   CALCIUM 9.0 08/23/2018 1006   PROT 6.5 08/23/2018 1006   ALBUMIN 3.8 08/23/2018 1006   AST 16 08/23/2018 1006   ALT 16 08/23/2018 1006   ALKPHOS 79 08/23/2018 1006   BILITOT 0.7 08/23/2018 1006   GFRNONAA >60 08/23/2018 1006   GFRNONAA 68 01/24/2013 0942   GFRAA >60 08/23/2018 1006   GFRAA 79 01/24/2013 0942    No results found for: TOTALPROTELP, ALBUMINELP, A1GS, A2GS, BETS, BETA2SER, GAMS, MSPIKE, SPEI  No results found for: KPAFRELGTCHN, LAMBDASER, KAPLAMBRATIO  Lab Results  Component Value Date   WBC 4.6 08/23/2018   NEUTROABS 2.4 08/23/2018   HGB 13.5 08/23/2018   HCT 41.3  08/23/2018   MCV 92.2 08/23/2018   PLT 253 08/23/2018    _0 @  Lab Results  Component Value Date   LABCA2 30 07/28/2010    No components found for: [id number]  No results for input(s): INR in the last 168 hours.  Lab Results  Component Value Date   LABCA2 30 07/28/2010    No results found for: BZJ696  No results found for: VEL381  No results found for: OFB510  No results found for: CA2729  No components found for: HGQUANT  No results found for: CEA1 / No results found for: CEA1   No results found for: AFPTUMOR  No results found for: [place]  No results found for: PSA1  Appointment on 08/23/2018  Component Date Value Ref Range Status  . WBC 08/23/2018 4.6  4.0 - 10.5 K/uL Final  . RBC 08/23/2018 4.48  3.87 - 5.11 MIL/uL Final  . Hemoglobin 08/23/2018 13.5  12.0 - 15.0 g/dL Final  . HCT 08/23/2018 41.3  36.0 - 46.0 % Final  . MCV 08/23/2018 92.2  80.0 - 100.0 fL Final  . MCH 08/23/2018 30.1  26.0 - 34.0 pg Final  . MCHC 08/23/2018 32.7  30.0 - 36.0 g/dL Final  . RDW 08/23/2018 12.0  11.5 - 15.5 % Final  . Platelets 08/23/2018 253  150 - 400 K/uL Final  . nRBC 08/23/2018 0.0  0.0 - 0.2 % Final  . Neutrophils Relative % 08/23/2018 52  % Final  . Neutro Abs 08/23/2018 2.4  1.7 - 7.7 K/uL Final  . Lymphocytes Relative 08/23/2018 35  % Final  . Lymphs Abs 08/23/2018 1.6  0.7 - 4.0 K/uL Final  . Monocytes Relative 08/23/2018 10  % Final  . Monocytes Absolute 08/23/2018 0.4  0.1 - 1.0 K/uL Final  . Eosinophils Relative 08/23/2018 2  % Final  . Eosinophils Absolute 08/23/2018 0.1  0.0 - 0.5 K/uL Final  . Basophils Relative 08/23/2018 1  % Final  . Basophils Absolute 08/23/2018 0.0  0.0 - 0.1 K/uL Final  . Immature Granulocytes 08/23/2018 0  % Final  . Abs Immature Granulocytes 08/23/2018 0.01  0.00 - 0.07 K/uL Final   Performed at [facility], [place] [address], [place], [place] [address]  . Sodium 08/23/2018 142  135 - 145  mmol/L Final  . Potassium 08/23/2018 4.0  3.5 - 5.1 mmol/L Final  . Chloride 08/23/2018 107  98 - 111 mmol/L Final  . CO2 08/23/2018 26  22 - 32 mmol/L Final  . Glucose, Bld 08/23/2018 108* 70 - 99 mg/dL Final  . BUN 08/23/2018 14  8 - 23 mg/dL Final  . Creatinine 08/23/2018 0.94  0.44 - 1.00 mg/dL Final  . Calcium 08/23/2018 9.0  8.9 - 10.3 mg/dL Final  . Total Protein 08/23/2018 6.5  6.5 - 8.1 g/dL Final  . Albumin 08/23/2018 3.8  3.5 - 5.0 g/dL Final  . AST 08/23/2018 16  15 - 41 U/L Final  . ALT 08/23/2018 16  0 - 44 U/L Final  . Alkaline Phosphatase 08/23/2018 79  38 - 126 U/L Final  . Total Bilirubin 08/23/2018 0.7  0.3 - 1.2 mg/dL Final  . GFR, Est Non Af Am 08/23/2018 >60  >60 mL/min Final  . GFR, Est AFR Am 08/23/2018 >60  >60 mL/min Final  . Anion gap 08/23/2018 9  5 - 15 Final   Performed at [facility], [place] [address] [place], [place] [address]    (this displays the last labs from the last 3 days)  No results found for: TOTALPROTELP, ALBUMINELP, A1GS, A2GS, BETS, BETA2SER, GAMS, MSPIKE, SPEI (this displays SPEP labs)  No results found for: KPAFRELGTCHN, LAMBDASER, KAPLAMBRATIO (kappa/lambda  light chains)  No results found for: HGBA, HGBA2QUANT, HGBFQUANT, HGBSQUAN (Hemoglobinopathy evaluation)   No results found for: LDH  No results found for: IRON, TIBC, IRONPCTSAT (Iron and TIBC)  No results found for: FERRITIN  Urinalysis    Component Value Date/Time   COLORURINE YELLOW 09/30/2017 [place] 09/30/2017 1807   LABSPEC 1.004 (L) 09/30/2017 1807   PHURINE 8.0 09/30/2017 1807   GLUCOSEU NEGATIVE 09/30/2017 1807   HGBUR NEGATIVE 09/30/2017 1807   BILIRUBINUR NEGATIVE 09/30/2017 1807   BILIRUBINUR n 04/13/2013 1318   KETONESUR NEGATIVE 09/30/2017 1807   PROTEINUR NEGATIVE 09/30/2017 1807   UROBILINOGEN negative 04/13/2013 1318   NITRITE NEGATIVE 09/30/2017 1807   LEUKOCYTESUR NEGATIVE 09/30/2017 1807      STUDIES: Nm Gastric Emptying  Result Date: 08/15/2018 CLINICAL DATA:  Abdominal bloating and nausea EXAM: NUCLEAR MEDICINE GASTRIC EMPTYING SCAN TECHNIQUE: After oral ingestion of radiolabeled meal, sequential abdominal images were obtained for 120 minutes. Residual percentage of activity remaining within the stomach was calculated at 60 and 120 minutes. RADIOPHARMACEUTICALS:  2.1 mCi Tc-64msulfur colloid in standardized meal including egg COMPARISON:  None. FINDINGS: Expected location of the stomach in the left upper quadrant. Ingested meal empties the stomach gradually over the course of the study with 54.4% retention at 60 min and 18.9% retention at 120 min (normal retention less than 30% at a 120 min). IMPRESSION: Normal gastric emptying study. Electronically Signed   By: WLowella GripIII M.D.   On: 08/15/2018 12:14     ELIGIBLE FOR AVAILABLE RESEARCH PROTOCOL: no  ASSESSMENT: 68y.o. [place] , [place] woman status post left lumpectomy February 2008 for a grade 3 ductal carcinoma in situ which was strongly estrogen and progesterone receptor positive.  (a)   adjuvant radiation completed May 2008  (b)  on tamoxifen July 2008 through March 2013.  (1) status post right breast upper outer quadrant biopsy 06/22/2017 for a clinnical T1b N0, stage IA invasive ductal carcinoma, with extracellular mucin; grade 2; estrogen and progesterone receptor positive, HER-2 not amplified, with an MIB-1 of 20%.  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 The following genes were evaluated for sequence changes only: HOXB13*, NTHL1*, SDHA   (3) Oncotype obtained from the 06/22/2017 biopsy showed a score of 25, predicting a risk of recurrence outside the breast over the next 9 years of 12% if the patient took antiestrogens for 5 years.  It also shows no benefit from chemotherapy.  (4) status post right lumpectomy and sentinel lymph node sampling 07/22/2017 for a pT1b pN0, stage Ia invasive ductal carcinoma, grade 2, with extracellular mucin, and negative margins.  (a) a total of 3 lymph nodes were removed  (5) adjuvant radiation 08/31/2017 to 10/19/2017 Site/dose:    1. The Right breast was treated to 50.4 Gy in 28 fractions of 1.8 Gy. 2. The Right breast was boosted to 10 Gy in 5 fractions of 2 Gy.  (6) started anastrozole 11/14/2017  (7) intensified screening: The patient will have yearly breast MRI approved r, and  yearly mammography April or May   PLAN: Is now a year out from definitive surgery for her breast cancer with no evidence of disease activity.  This is favorable.  She is tolerating anastrozole generally well.  She is having some hot flashes.  I think she tolerates it well enough that we will be able to continue that for total of 5 years.  She is trying to lose weight which I think is a good move from a number of points of view including breast cancer.  I have encouraged her to further increase her exercise tolerance.  She is adjusting to the loss of her husband.  She is planning to stay in her current house at present  We are going to continue to do intensified screening for her ATM mutation and she will have her next breast MRI in late October.  She will return to see me mid November.  She knows to call for any other issue that may develop before that visit.  ADDENDUM: From the genetics note dated 07/21/2017: The ATM gene is involved in the detection and surveillance of DNA damage.  ATM  phosphorylation of BRCA1 is critical for proper response to DNA double-strand breaks.  This is believed to be the reason for the role ATM has in breast cancer risk.  Individuals with a ATM mutation are at a greater risk for having children with Ataxia-telangiectasia (A-T). AT is characterized by progressive cerebellar degeneration (ataxia), dilated blood vessels in the eyes and skin (telangiectasia), immunodeficiency, chromosomal instability, increased sensitivity to ionizing radiation and a predisposition to lymphoma and leukemia.  Therefore, individuals of childbearing age who have a known ATM mutation may want to consider having their spouse tested to determine their risk for having a child with A-T.  Studies of these families demonstrated increased incident of breast cancer in the mothers (who are heterozygous carriers) of the affected children, thus prompting further evaluation of the relationship between breast cancer and ATM.  Women who are heterozygous ATM carriers have an increase breast cancer risk.  They have 5-fold increased risk of breast cancer <50 years, and 2-3 fold increased risk for breast cancer overall.  In families with familial breast cancer that were negative for BRCA1 or BRCA2 genes, approximately 2.7% of women were found to have one ATM mutation.  In families with both breast cancer and leukemia, 6.7% of women were found to have one ATM mutation.   Management for individuals with ATM mutations can be found in the NCCN guidelines (v.3.2019). These guidelines recommend the following:  Breast Cancer   Screening: Annual mammogram with consideration of tomosynthesis and consider breast MRI with contrast starting at age 27 years.  Risk Reducing Mastectomy: Evidence insufficient, consider based on family history  Ovarian Cancer  No increased risk for ovarian cancer, therefore no recommendations  Other Cancer Risks  Unknown or insufficient evidence for pancreatic or  prostate cancer risk  Kashmere Staffa, Virgie Dad, MD  08/23/18 11:15 AM Medical Oncology and Hematology [facility] [address] [place], [place] [address] Tel. [phone]    Fax. [phone]   I, Wilburn Mylar, am acting as scribe for Dr. Virgie Dad. Mitesh Rosendahl.  I, Lurline Del MD, have reviewed the above documentation for accuracy and completeness, and I agree with the above.

## 2018-08-23 ENCOUNTER — Other Ambulatory Visit: Payer: Self-pay

## 2018-08-23 ENCOUNTER — Inpatient Hospital Stay (HOSPITAL_BASED_OUTPATIENT_CLINIC_OR_DEPARTMENT_OTHER): Payer: Medicare Other | Admitting: Oncology

## 2018-08-23 ENCOUNTER — Inpatient Hospital Stay: Payer: Medicare Other | Attending: Oncology

## 2018-08-23 VITALS — BP 130/52 | HR 71 | Temp 98.5°F | Resp 18 | Ht 64.0 in | Wt 208.5 lb

## 2018-08-23 DIAGNOSIS — D0512 Intraductal carcinoma in situ of left breast: Secondary | ICD-10-CM

## 2018-08-23 DIAGNOSIS — C50911 Malignant neoplasm of unspecified site of right female breast: Secondary | ICD-10-CM

## 2018-08-23 DIAGNOSIS — C50912 Malignant neoplasm of unspecified site of left female breast: Secondary | ICD-10-CM

## 2018-08-23 DIAGNOSIS — Z170 Estrogen receptor positive status [ER+]: Secondary | ICD-10-CM

## 2018-08-23 DIAGNOSIS — Z79811 Long term (current) use of aromatase inhibitors: Secondary | ICD-10-CM

## 2018-08-23 DIAGNOSIS — C50411 Malignant neoplasm of upper-outer quadrant of right female breast: Secondary | ICD-10-CM | POA: Diagnosis present

## 2018-08-23 LAB — CMP (CANCER CENTER ONLY)
ALT: 16 U/L (ref 0–44)
AST: 16 U/L (ref 15–41)
Albumin: 3.8 g/dL (ref 3.5–5.0)
Alkaline Phosphatase: 79 U/L (ref 38–126)
Anion gap: 9 (ref 5–15)
BUN: 14 mg/dL (ref 8–23)
CO2: 26 mmol/L (ref 22–32)
Calcium: 9 mg/dL (ref 8.9–10.3)
Chloride: 107 mmol/L (ref 98–111)
Creatinine: 0.94 mg/dL (ref 0.44–1.00)
GFR, Est AFR Am: >60 mL/min (ref >60)
GFR, Estimated: >60 mL/min (ref >60)
Glucose, Bld: 108 mg/dL — ABNORMAL HIGH (ref 70–99)
Potassium: 4 mmol/L (ref 3.5–5.1)
Sodium: 142 mmol/L (ref 135–145)
Total Bilirubin: 0.7 mg/dL (ref 0.3–1.2)
Total Protein: 6.5 g/dL (ref 6.5–8.1)

## 2018-08-23 LAB — CBC WITH DIFFERENTIAL/PLATELET
Abs Immature Granulocytes: 0.01 10*3/uL (ref 0.00–0.07)
Basophils Absolute: 0 10*3/uL (ref 0.0–0.1)
Basophils Relative: 1 %
Eosinophils Absolute: 0.1 10*3/uL (ref 0.0–0.5)
Eosinophils Relative: 2 %
HCT: 41.3 % (ref 36.0–46.0)
Hemoglobin: 13.5 g/dL (ref 12.0–15.0)
Immature Granulocytes: 0 %
Lymphocytes Relative: 35 %
Lymphs Abs: 1.6 10*3/uL (ref 0.7–4.0)
MCH: 30.1 pg (ref 26.0–34.0)
MCHC: 32.7 g/dL (ref 30.0–36.0)
MCV: 92.2 fL (ref 80.0–100.0)
Monocytes Absolute: 0.4 10*3/uL (ref 0.1–1.0)
Monocytes Relative: 10 %
Neutro Abs: 2.4 10*3/uL (ref 1.7–7.7)
Neutrophils Relative %: 52 %
Platelets: 253 10*3/uL (ref 150–400)
RBC: 4.48 MIL/uL (ref 3.87–5.11)
RDW: 12 % (ref 11.5–15.5)
WBC: 4.6 10*3/uL (ref 4.0–10.5)
nRBC: 0 % (ref 0.0–0.2)

## 2018-08-23 MED ORDER — ANASTROZOLE 1 MG PO TABS: 1.0000 mg | ORAL_TABLET | Freq: Every day | ORAL | 4 refills | Status: DC

## 2018-08-25 ENCOUNTER — Telehealth: Payer: Self-pay | Admitting: Oncology

## 2018-08-25 NOTE — Telephone Encounter (Signed)
I talk with patient regarding schedule  

## 2018-09-27 ENCOUNTER — Other Ambulatory Visit: Payer: Self-pay | Admitting: *Deleted

## 2018-09-27 MED ORDER — GABAPENTIN 100 MG PO CAPS: 100.0000 mg | ORAL_CAPSULE | Freq: Every day | ORAL | 3 refills | Status: DC

## 2018-10-22 ENCOUNTER — Emergency Department (HOSPITAL_BASED_OUTPATIENT_CLINIC_OR_DEPARTMENT_OTHER)
Admission: EM | Admit: 2018-10-22 | Discharge: 2018-10-22 | Disposition: A | Discharge status: Home / Self Care | Payer: Medicare Other | Attending: Emergency Medicine | Admitting: Emergency Medicine

## 2018-10-22 ENCOUNTER — Emergency Department (HOSPITAL_BASED_OUTPATIENT_CLINIC_OR_DEPARTMENT_OTHER): Payer: Medicare Other

## 2018-10-22 ENCOUNTER — Other Ambulatory Visit: Payer: Self-pay

## 2018-10-22 ENCOUNTER — Encounter (HOSPITAL_BASED_OUTPATIENT_CLINIC_OR_DEPARTMENT_OTHER): Payer: Self-pay | Admitting: Adult Health

## 2018-10-22 DIAGNOSIS — K5792 Diverticulitis of intestine, part unspecified, without perforation or abscess without bleeding: Secondary | ICD-10-CM | POA: Insufficient documentation

## 2018-10-22 DIAGNOSIS — R1030 Lower abdominal pain, unspecified: Secondary | ICD-10-CM | POA: Diagnosis present

## 2018-10-22 DIAGNOSIS — I10 Essential (primary) hypertension: Secondary | ICD-10-CM | POA: Diagnosis not present

## 2018-10-22 DIAGNOSIS — Z79899 Other long term (current) drug therapy: Secondary | ICD-10-CM | POA: Diagnosis not present

## 2018-10-22 DIAGNOSIS — R42 Dizziness and giddiness: Secondary | ICD-10-CM | POA: Insufficient documentation

## 2018-10-22 LAB — URINALYSIS, ROUTINE W REFLEX MICROSCOPIC
Bilirubin Urine: NEGATIVE
Glucose, UA: NEGATIVE mg/dL
Hgb urine dipstick: NEGATIVE
Ketones, ur: NEGATIVE mg/dL
Leukocytes,Ua: NEGATIVE
Nitrite: NEGATIVE
Protein, ur: NEGATIVE mg/dL
Specific Gravity, Urine: <1.005 — ABNORMAL LOW (ref 1.005–1.030)
pH: 6.5 (ref 5.0–8.0)

## 2018-10-22 LAB — COMPREHENSIVE METABOLIC PANEL
ALT: 20 U/L (ref 0–44)
AST: 21 U/L (ref 15–41)
Albumin: 4.4 g/dL (ref 3.5–5.0)
Alkaline Phosphatase: 84 U/L (ref 38–126)
Anion gap: 11 (ref 5–15)
BUN: 13 mg/dL (ref 8–23)
CO2: 24 mmol/L (ref 22–32)
Calcium: 9.5 mg/dL (ref 8.9–10.3)
Chloride: 102 mmol/L (ref 98–111)
Creatinine, Ser: 0.8 mg/dL (ref 0.44–1.00)
GFR calc Af Amer: >60 mL/min (ref >60)
GFR calc non Af Amer: >60 mL/min (ref >60)
Glucose, Bld: 113 mg/dL — ABNORMAL HIGH (ref 70–99)
Potassium: 3.4 mmol/L — ABNORMAL LOW (ref 3.5–5.1)
Sodium: 137 mmol/L (ref 135–145)
Total Bilirubin: 1.2 mg/dL (ref 0.3–1.2)
Total Protein: 7.4 g/dL (ref 6.5–8.1)

## 2018-10-22 LAB — CBC WITH DIFFERENTIAL/PLATELET
Abs Immature Granulocytes: 0.02 10*3/uL (ref 0.00–0.07)
Basophils Absolute: 0 10*3/uL (ref 0.0–0.1)
Basophils Relative: 0 %
Eosinophils Absolute: 0.1 10*3/uL (ref 0.0–0.5)
Eosinophils Relative: 1 %
HCT: 43.5 % (ref 36.0–46.0)
Hemoglobin: 14.2 g/dL (ref 12.0–15.0)
Immature Granulocytes: 0 %
Lymphocytes Relative: 13 %
Lymphs Abs: 1.4 10*3/uL (ref 0.7–4.0)
MCH: 29.7 pg (ref 26.0–34.0)
MCHC: 32.6 g/dL (ref 30.0–36.0)
MCV: 91 fL (ref 80.0–100.0)
Monocytes Absolute: 1 10*3/uL (ref 0.1–1.0)
Monocytes Relative: 9 %
Neutro Abs: 8.6 10*3/uL — ABNORMAL HIGH (ref 1.7–7.7)
Neutrophils Relative %: 77 %
Platelets: 255 10*3/uL (ref 150–400)
RBC: 4.78 MIL/uL (ref 3.87–5.11)
RDW: 12.3 % (ref 11.5–15.5)
WBC: 11.1 10*3/uL — ABNORMAL HIGH (ref 4.0–10.5)
nRBC: 0 % (ref 0.0–0.2)

## 2018-10-22 LAB — LIPASE, BLOOD: Lipase: 25 U/L (ref 11–51)

## 2018-10-22 LAB — LACTIC ACID, PLASMA: Lactic Acid, Venous: 1.1 mmol/L (ref 0.5–1.9)

## 2018-10-22 MED ORDER — SODIUM CHLORIDE 0.9% FLUSH
3.0000 mL | Freq: Once | INTRAVENOUS | Status: DC
  Filled 2018-10-22: qty 3

## 2018-10-22 MED ORDER — METRONIDAZOLE IN NACL 5-0.79 MG/ML-% IV SOLN
500.0000 mg | Freq: Once | INTRAVENOUS | Status: AC
  Administered 2018-10-22: 500 mg via INTRAVENOUS
  Filled 2018-10-22: qty 100

## 2018-10-22 MED ORDER — MORPHINE SULFATE (PF) 4 MG/ML IV SOLN
4.0000 mg | Freq: Once | INTRAVENOUS | Status: AC
  Administered 2018-10-22: 21:00:00 4 mg via INTRAVENOUS
  Filled 2018-10-22: qty 1

## 2018-10-22 MED ORDER — METRONIDAZOLE 500 MG PO TABS: 500.0000 mg | ORAL_TABLET | Freq: Two times a day (BID) | ORAL | 0 refills | Status: DC

## 2018-10-22 MED ORDER — ACETAMINOPHEN 325 MG PO TABS
650.0000 mg | ORAL_TABLET | Freq: Once | ORAL | Status: AC
  Administered 2018-10-22: 21:00:00 650 mg via ORAL
  Filled 2018-10-22: qty 2

## 2018-10-22 MED ORDER — ONDANSETRON HCL 4 MG/2ML IJ SOLN
4.0000 mg | Freq: Once | INTRAMUSCULAR | Status: AC
  Administered 2018-10-22: 4 mg via INTRAVENOUS
  Filled 2018-10-22: qty 2

## 2018-10-22 MED ORDER — CIPROFLOXACIN HCL 500 MG PO TABS: 500.0000 mg | ORAL_TABLET | Freq: Two times a day (BID) | ORAL | 0 refills | Status: DC

## 2018-10-22 MED ORDER — SODIUM CHLORIDE 0.9 % IV BOLUS
1000.0000 mL | Freq: Once | INTRAVENOUS | Status: AC
  Administered 2018-10-22: 21:00:00 1000 mL via INTRAVENOUS

## 2018-10-22 MED ORDER — SODIUM CHLORIDE 0.9 % IV SOLN
2.0000 g | Freq: Once | INTRAVENOUS | Status: AC
  Administered 2018-10-22: 22:00:00 2 g via INTRAVENOUS
  Filled 2018-10-22: qty 20

## 2018-10-22 MED ORDER — IOHEXOL 300 MG/ML  SOLN
100.0000 mL | Freq: Once | INTRAMUSCULAR | Status: AC
  Administered 2018-10-22: 100 mL via INTRAVENOUS

## 2018-10-22 NOTE — ED Notes (Signed)
Taken to xray.

## 2018-10-22 NOTE — ED Notes (Signed)
Discharge instructions given.  To assist patient out after antibiotic finishes infusing and IV is removed.

## 2018-10-22 NOTE — ED Triage Notes (Addendum)
Presents with lower abdominal pain that began this am. Associated with frequent urination, dizziness and loose stool. She denies fevers. Denies nausea. Endorses loss of appetite. She endorses a foul odor to her urine. Abdominal pain comes and goes and is described as cramping. Pt is febrile at 100.5 and HR is 120.

## 2018-10-22 NOTE — Discharge Instructions (Signed)
You were seen in the emergency department for some lower abdominal pain.  You had a low-grade fever here but your blood work and urinalysis were unremarkable.  You had a CAT scan that showed some inflammation in your colon which could either be colitis or diverticulitis.  We are treating you with antibiotics for 1 week.  It will be important for you to follow-up with your primary care doctor as there was some other findings on the CAT scan that will need to be followed up.  Please return if any worsening symptoms.  Below is the report of the CAT scan which she should inform your primary care doctor about.  IMPRESSION:  1. Short segment of circumferential mucosal thickening of the  sigmoid colon with pericolonic inflammatory changes and small amount  of free fluid in the left para colic gutter. Prominent diverticulum  within the proximal portion of the abnormal bowel. These findings  may represent acute diverticulitis or focal colitis. Underlying mass  cannot be excluded and therefore correlation to colonoscopy, after  resolution of the acute symptoms may be considered.  2. 3.2 cm cystic appearing left adnexal mass. Further evaluation  with pelvic ultrasound may be considered on a nonemergent basis.

## 2018-10-22 NOTE — ED Provider Notes (Signed)
[facility] EMERGENCY DEPARTMENT Provider Note   CSN: [id number] Arrival date & time: 10/22/18  2011     History   Chief Complaint Chief Complaint  Patient presents with   Abdominal Pain    HPI Katelyn Porter is a 68 y.o. female.  She is complaining of some lower abdominal pain that started this morning.  She said it is crampy in nature and is been coming and going but getting more severe over time.  She had one episode of loose stools and feels a little dizzy and lightheaded.  She said she does not have much of an appetite.  No nausea no vomiting no cough no chest pain or shortness of breath.  She is vague about any urine symptoms but maybe is going more frequently.  She did notice that she was running a fever and has not had any chills.  No sick contacts or recent travel.     The history is provided by the patient.  Abdominal Pain Pain location:  Suprapubic, LLQ and RLQ Pain quality: cramping   Pain radiates to:  Does not radiate Pain severity:  Moderate Onset quality:  Gradual Timing:  Intermittent Progression:  Worsening Chronicity:  New Context: not recent illness, not recent travel, not suspicious food intake and not trauma   Relieved by:  None tried Worsened by:  Nothing Ineffective treatments:  None tried Associated symptoms: anorexia, diarrhea and fever   Associated symptoms: no chest pain, no constipation, no cough, no dysuria, no hematemesis, no hematochezia, no hematuria, no melena, no nausea, no shortness of breath, no sore throat, no vaginal bleeding, no vaginal discharge and no vomiting     Past Medical History:  Diagnosis Date   Breast cancer ([place]) 2008   left, with radiation finished 08, tamoxifen   Breast cancer ([place]) 2019   Rt. had lymph node removed  having radiation currently   Complication of anesthesia    woke up with colonoscopy and hysterectomy   Depression 01/24/2013   Family history of breast cancer    Family history of  colon cancer    GERD (gastroesophageal reflux disease)    Hyperlipidemia    Hypertension    IBS (irritable bowel syndrome)    Palpitations    Vertigo     Patient Active Problem List   Diagnosis Date Noted   Family history of ovarian cancer 07/21/2017   Genetic testing 07/13/2017   Family history of breast cancer    Family history of colon cancer    Malignant neoplasm of upper-outer quadrant of right breast in female, estrogen receptor positive ([place]) 06/29/2017   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, left 06/29/2017   Hyperlipidemia 07/23/2016   Essential (primary) hypertension 05/11/2014   Gastro-esophageal reflux disease without esophagitis 01/24/2013   Recurrent major depressive disorder, in partial remission ([place]) 01/24/2013   Routine general medical examination at a health care facility 06/22/2012   Other malaise and fatigue 05/10/2012   Hot flashes 05/10/2012   Unspecified constipation 05/10/2012    Past Surgical History:  Procedure Laterality Date   ABDOMINAL HYSTERECTOMY  1985   TAH (ovaries remain)   BREAST LUMPECTOMY Left 2007   BREAST LUMPECTOMY Right 2019   BREAST LUMPECTOMY WITH RADIOACTIVE SEED AND SENTINEL LYMPH NODE BIOPSY Right 07/22/2017   Procedure: BREAST LUMPECTOMY WITH RADIOACTIVE SEED AND SENTINEL LYMPH NODE BIOPSY;  Surgeon: Erroll Luna, MD;  Location: [facility];  Service: General;  Laterality: Right;   COLONOSCOPY  TONSILLECTOMY     age 22     OB History    Gravida  1   Para  0   Term      Preterm      AB      Living  0     SAB      TAB      Ectopic      Multiple      Live Births               Home Medications    Prior to Admission medications   Medication Sig Start Date End Date Taking? Authorizing Provider  anastrozole (ARIMIDEX) 1 MG tablet Take 1 tablet (1 mg total) by mouth daily. 08/23/18   Magrinat, Virgie Dad, MD  Cholecalciferol (VITAMIN D3)  1000 units CAPS Take 1 capsule by mouth daily.    [provider]  DULoxetine (CYMBALTA) 60 MG capsule Take 30 mg by mouth 2 (two) times daily.  06/22/17   [provider]  gabapentin (NEURONTIN) 100 MG capsule Take 1 capsule (100 mg total) by mouth at bedtime. 09/27/18   Magrinat, Virgie Dad, MD  Multiple Vitamins-Minerals (ONE-A-DAY WOMENS 50 PLUS PO) Take 1 tablet by mouth daily.    [provider]  pantoprazole (PROTONIX) 40 MG tablet Take 1 tablet (40 mg total) by mouth daily. 03/29/18   Irene Shipper, MD  pravastatin (PRAVACHOL) 40 MG tablet Take 1 tablet (40 mg total) by mouth daily. 05/08/13   Debbrah Alar, NP  triamterene-hydrochlorothiazide (DYAZIDE) 37.5-25 MG capsule Take by mouth.    [provider]    Family History Family History  Problem Relation Age of Onset   Lung cancer Father        Died at 71 from lung cancer   Heart disease Father        heart attack   Colon cancer Mother 72   Hypertension Mother    Kidney disease Mother    Diabetes Mother    Hypertension Sister    Diabetes Sister    Colon polyps Sister    Breast cancer Maternal Aunt        dx in her 17s   Coronary artery disease Brother 39       died of "massive heart attack"   Diabetes Brother    Colon cancer Cousin        maternal 1st cousin dx over 65   Ovarian cancer Paternal Grandmother    Breast cancer Cousin        mat 1st cousin dx in her 32s   Esophageal cancer Neg Hx    Stomach cancer Neg Hx    Rectal cancer Neg Hx     Social History Social History   Tobacco Use   Smoking status: Never Smoker   Smokeless tobacco: Never Used  Substance Use Topics   Alcohol use: No   Drug use: No     Allergies   Crab [shellfish allergy], Sulfa antibiotics, Lipitor [atorvastatin], and Sulfa drugs cross reactors   Review of Systems Review of Systems  Constitutional: Positive for appetite change and fever.  HENT: Negative for sore throat.     Eyes: Negative for visual disturbance.  Respiratory: Negative for cough and shortness of breath.   Cardiovascular: Negative for chest pain.  Gastrointestinal: Positive for abdominal pain, anorexia and diarrhea. Negative for constipation, hematemesis, hematochezia, melena, nausea and vomiting.  Genitourinary: Positive for frequency. Negative for dysuria, flank pain, hematuria, vaginal bleeding and vaginal  discharge.  Musculoskeletal: Negative for back pain.  Skin: Negative for rash.  Neurological: Positive for light-headedness. Negative for headaches.     Physical Exam Updated Vital Signs BP (!) 159/93 (BP Location: Right Arm)    Pulse (!) 120    Temp (!) 100.5 F (38.1 C) (Oral)    Resp (!) 24    Ht 5\' 3"  (1.6 m)    Wt 91.6 kg    LMP 03/17/1983    SpO2 98%    BMI 35.78 kg/m   Physical Exam Vitals signs and nursing note reviewed.  Constitutional:      General: She is not in acute distress.    Appearance: She is well-developed.  HENT:     Head: Normocephalic and atraumatic.  Eyes:     Conjunctiva/sclera: Conjunctivae normal.  Neck:     Musculoskeletal: Neck supple.  Cardiovascular:     Rate and Rhythm: Normal rate and regular rhythm.     Heart sounds: No murmur.  Pulmonary:     Effort: Pulmonary effort is normal. No respiratory distress.     Breath sounds: Normal breath sounds.  Abdominal:     General: There is no distension.     Palpations: Abdomen is soft.     Tenderness: There is no abdominal tenderness. There is no guarding or rebound.  Skin:    General: Skin is warm and dry.     Capillary Refill: Capillary refill takes less than 2 seconds.  Neurological:     General: No focal deficit present.     Mental Status: She is alert and oriented to person, place, and time.      ED Treatments / Results  Labs (all labs ordered are listed, but only abnormal results are displayed) Labs Reviewed  COMPREHENSIVE METABOLIC PANEL - Abnormal; Notable for the following  components:      Result Value   Potassium 3.4 (*)    Glucose, Bld 113 (*)    All other components within normal limits  CBC WITH DIFFERENTIAL/PLATELET - Abnormal; Notable for the following components:   WBC 11.1 (*)    Neutro Abs 8.6 (*)    All other components within normal limits  URINALYSIS, ROUTINE W REFLEX MICROSCOPIC - Abnormal; Notable for the following components:   Specific Gravity, Urine <1.005 (*)    All other components within normal limits  LACTIC ACID, PLASMA  LIPASE, BLOOD    EKG None  Radiology Dg Chest 2 View  Result Date: 10/22/2018 CLINICAL DATA:  Abdominal pain and nausea. EXAM: CHEST - 2 VIEW COMPARISON:  September 30, 2017 FINDINGS: Cardiomediastinal silhouette is normal. Mediastinal contours appear intact. There is no evidence of focal airspace consolidation, pleural effusion or pneumothorax. Osseous structures are without acute abnormality. Right chest wall postsurgical changes. IMPRESSION: No active cardiopulmonary disease. Electronically Signed   By: Fidela Salisbury M.D.   On: 10/22/2018 21:20   Ct Abdomen Pelvis W Contrast  Result Date: 10/22/2018 CLINICAL DATA:  Abdominal pain. EXAM: CT ABDOMEN AND PELVIS WITH CONTRAST TECHNIQUE: Multidetector CT imaging of the abdomen and pelvis was performed using the standard protocol following bolus administration of intravenous contrast. CONTRAST:  130mL OMNIPAQUE IOHEXOL 300 MG/ML  SOLN COMPARISON:  None. FINDINGS: Lower chest: No acute abnormality. Hepatobiliary: No focal liver abnormality is seen. No gallstones, gallbladder wall thickening, or biliary dilatation. Pancreas: Unremarkable. No pancreatic ductal dilatation or surrounding inflammatory changes. Spleen: Normal in size without focal abnormality. Adrenals/Urinary Tract: Adrenal glands are unremarkable. Kidneys are normal, without renal calculi, focal  lesion, or hydronephrosis. Bladder is unremarkable. Stomach/Bowel: Stomach is within normal limits. Appendix appears  normal. No evidence of small bowel wall thickening, distention, or inflammatory changes. Short segment of circumferential mucosal thickening of the sigmoid colon with pericolonic inflammatory changes and small amount of free fluid in the left para colic gutter. Prominent diverticulum within the proximal portion of the abnormal bowel. Vascular/Lymphatic: Aortic atherosclerosis. No enlarged abdominal or pelvic lymph nodes. Reproductive: Status post hysterectomy. 3.2 cm cystic appearing circumscribed left adnexal mass. Other: No abdominal wall hernia or abnormality. No abdominopelvic ascites. Musculoskeletal: No acute or significant osseous findings. IMPRESSION: 1. Short segment of circumferential mucosal thickening of the sigmoid colon with pericolonic inflammatory changes and small amount of free fluid in the left para colic gutter. Prominent diverticulum within the proximal portion of the abnormal bowel. These findings may represent acute diverticulitis or focal colitis. Underlying mass cannot be excluded and therefore correlation to colonoscopy, after resolution of the acute symptoms may be considered. 2. 3.2 cm cystic appearing left adnexal mass. Further evaluation with pelvic ultrasound may be considered on a nonemergent basis. Aortic Atherosclerosis (ICD10-I70.0). Electronically Signed   By: Fidela Salisbury M.D.   On: 10/22/2018 21:48    Procedures Procedures (including critical care time)  Medications Ordered in ED Medications  sodium chloride 0.9 % bolus 1,000 mL (0 mLs Intravenous Stopped 10/22/18 2259)  ondansetron (ZOFRAN) injection 4 mg (4 mg Intravenous Given 10/22/18 2040)  morphine 4 MG/ML injection 4 mg (4 mg Intravenous Given 10/22/18 2040)  acetaminophen (TYLENOL) tablet 650 mg (650 mg Oral Given 10/22/18 2054)  iohexol (OMNIPAQUE) 300 MG/ML solution 100 mL (100 mLs Intravenous Contrast Given 10/22/18 2113)  cefTRIAXone (ROCEPHIN) 2 g in sodium chloride 0.9 % 100 mL IVPB (0 g Intravenous  Stopped 10/22/18 2239)    And  metroNIDAZOLE (FLAGYL) IVPB 500 mg (0 mg Intravenous Stopped 10/22/18 2342)     Initial Impression / Assessment and Plan / ED Course  I have reviewed the triage vital signs and the nursing notes.  Pertinent labs & imaging results that were available during my care of the patient were reviewed by me and considered in my medical decision making (see chart for details).  Clinical Course as of Oct 23 1014  Sat Aug 08, 949  7523 68 year old female here with abdominal pain that started earlier today associated with one episode of diarrhea and possibly some urinary symptoms.  She is tachycardic here and febrile to 100.5.  Sats are good.  No cough no shortness of breath no sore throat no headache.  She does feel little lightheaded.  Differential includes [place], gastroenteritis, diverticulitis, appendicitis,   [MB]  2155 Patient CT reviewed by me.  She has some inflammation in her left lower quadrant.  Radiology reading is diverticulitis versus colitis.  Have ordered her IV ceftriaxone and Flagyl.  Rest of her labs look fairly unremarkable and normal lactate so I think if her symptoms are controlled and her vital signs normalized we can probably do outpatient treatment.   [MB]  2200 I reviewed the patient's results with her.  I let her know that we are going to give her some IV antibiotics and then recheck her vital signs and if she continued to feel well we been able to discharge her on some oral antibiotics and outpatient follow-up.  She is comfortable with plan.   [MB]    Clinical Course User Index [MB] Hayden Rasmussen, MD        Final Clinical Impressions(s) /  ED Diagnoses   Final diagnoses:  Acute diverticulitis    ED Discharge Orders         Ordered    ciprofloxacin (CIPRO) 500 MG tablet  2 times daily     10/22/18 2228    metroNIDAZOLE (FLAGYL) 500 MG tablet  2 times daily     10/22/18 2228           Hayden Rasmussen, MD 10/23/18 1016

## 2018-10-22 NOTE — ED Notes (Signed)
One set of blood cultures drawn from [facility] at request of [name]. Placed in lab.

## 2018-12-29 ENCOUNTER — Encounter: Payer: Self-pay | Admitting: Obstetrics & Gynecology

## 2018-12-29 ENCOUNTER — Other Ambulatory Visit: Payer: Self-pay | Admitting: Obstetrics & Gynecology

## 2018-12-29 ENCOUNTER — Ambulatory Visit (INDEPENDENT_AMBULATORY_CARE_PROVIDER_SITE_OTHER): Payer: Medicare Other | Admitting: Obstetrics & Gynecology

## 2018-12-29 ENCOUNTER — Other Ambulatory Visit: Payer: Self-pay

## 2018-12-29 VITALS — BP 140/70 | HR 76 | Temp 97.0°F | Ht 62.0 in | Wt 207.0 lb

## 2018-12-29 DIAGNOSIS — Z124 Encounter for screening for malignant neoplasm of cervix: Secondary | ICD-10-CM | POA: Diagnosis not present

## 2018-12-29 DIAGNOSIS — N83202 Unspecified ovarian cyst, left side: Secondary | ICD-10-CM

## 2018-12-29 DIAGNOSIS — Z01419 Encounter for gynecological examination (general) (routine) without abnormal findings: Secondary | ICD-10-CM | POA: Diagnosis not present

## 2018-12-29 DIAGNOSIS — N9089 Other specified noninflammatory disorders of vulva and perineum: Secondary | ICD-10-CM | POA: Diagnosis not present

## 2018-12-29 NOTE — Patient Instructions (Signed)

## 2018-12-29 NOTE — Progress Notes (Signed)
68 y.o. G1P0 Married Katelyn Porter here for annual exam.  Had recent ER visit due to diverticulitis.  Being followed by Dr. Henrene Pastor.  She is now on Protonix and a probiotic.   CT showed 3.2cm cystic mass.  Ultrasound follow up recommended.    Having some frustrations with blood pressure.  It has been running a little higher lately.    Has appt in December with Dr. [name].    Husband died in 02/12/2023 due to hemorrhagic stroke.    PCP:  Dr. Suzy Bouchard.  Last appt was about a month ago.    Patient's last menstrual period was 03/17/1983.          Sexually active: No.  The current method of family planning is status post hysterectomy.    Exercising: Yes.    yard work, bike  Smoker:  no  Health Maintenance: Pap:  2007 Normal  History of abnormal Pap:  no MMG:  06/23/18 Diagnostic Bilateral. BIRADS2:Benign. F/u diagnostic 1 year  Colonoscopy:  09/07/17 polyps. Adenomatous polyps.  Dr. Henrene Pastor. F/u 3 years. BMD:   01/27/18 Normal  TDaP:  2016 Pneumonia vaccine(s):  Done  Shingrix:   Declines Flu vacc: 12/27/2018 Hep C testing: 06/09/16 Neg  Screening Labs: PCP   reports that she has never smoked. She has never used smokeless tobacco. She reports that she does not drink alcohol or use drugs.  Past Medical History:  Diagnosis Date  . Breast cancer ([place]) 2008   left, with radiation finished 08, tamoxifen  . Breast cancer ([place]) 2019   Rt. had lymph node removed  having radiation currently  . Complication of anesthesia    woke up with colonoscopy and hysterectomy  . Depression 01/24/2013  . Family history of breast cancer   . Family history of colon cancer   . GERD (gastroesophageal reflux disease)   . Hyperlipidemia   . Hypertension   . IBS (irritable bowel syndrome)   . Palpitations   . Vertigo     Past Surgical History:  Procedure Laterality Date  . ABDOMINAL HYSTERECTOMY  1985   TAH (ovaries remain)  . BREAST LUMPECTOMY Left 2007  . BREAST LUMPECTOMY Right 2019   . BREAST LUMPECTOMY WITH RADIOACTIVE SEED AND SENTINEL LYMPH NODE BIOPSY Right 07/22/2017   Procedure: BREAST LUMPECTOMY WITH RADIOACTIVE SEED AND SENTINEL LYMPH NODE BIOPSY;  Surgeon: Erroll Luna, MD;  Location: [facility];  Service: General;  Laterality: Right;  . COLONOSCOPY    . TONSILLECTOMY     age 4    Current Outpatient Medications  Medication Sig Dispense Refill  . anastrozole (ARIMIDEX) 1 MG tablet Take 1 tablet (1 mg total) by mouth daily. 90 tablet 4  . Cholecalciferol (VITAMIN D3) 1000 units CAPS Take 1 capsule by mouth daily.    . cloNIDine (CATAPRES) 0.1 MG tablet Take 0.1 mg by mouth at bedtime.    . DULoxetine (CYMBALTA) 60 MG capsule Take 30 mg by mouth 2 (two) times daily.     Marland Kitchen gabapentin (NEURONTIN) 100 MG capsule Take 1 capsule (100 mg total) by mouth at bedtime. 30 capsule 3  . Multiple Vitamins-Minerals (ONE-A-DAY WOMENS 50 PLUS PO) Take 1 tablet by mouth daily.    . pantoprazole (PROTONIX) 40 MG tablet Take 1 tablet (40 mg total) by mouth daily. 90 tablet 3  . rosuvastatin (CRESTOR) 20 MG tablet Take 20 mg by mouth daily.    Marland Kitchen triamterene-hydrochlorothiazide (DYAZIDE) 37.5-25 MG capsule Take by mouth.  Current Facility-Administered Medications  Medication Dose Route Frequency Provider Last Rate Last Dose  . 0.9 %  sodium chloride infusion  500 mL Intravenous Once Irene Shipper, MD        Family History  Problem Relation Age of Onset  . Lung cancer Father        Died at 77 from lung cancer  . Heart disease Father        heart attack  . Colon cancer Mother 50  . Hypertension Mother   . Kidney disease Mother   . Diabetes Mother   . Hypertension Sister   . Diabetes Sister   . Colon polyps Sister   . Breast cancer Maternal Aunt        dx in her 49s  . Coronary artery disease Brother 72       died of "massive heart attack"  . Diabetes Brother   . Colon cancer Cousin        maternal 1st cousin dx over 21  . Ovarian cancer Paternal  Grandmother   . Breast cancer Cousin        mat 1st cousin dx in her 25s  . Esophageal cancer Neg Hx   . Stomach cancer Neg Hx   . Rectal cancer Neg Hx     Review of Systems  Genitourinary:       Breast pain- bilateral   All other systems reviewed and are negative.   Exam:   BP 140/70   Pulse 76   Temp (!) 97 F (36.1 C) (Temporal)   Ht _0  (1.575 m)   Wt 207 lb (93.9 kg)   LMP 03/17/1983   BMI 37.86 kg/m   Height:   Height: _1  (157.5 cm)  Ht Readings from Last 3 Encounters:  12/29/18 _2  (1.575 m)  10/22/18 _3  (1.6 m)  08/23/18 _4  (1.626 m)    General appearance: alert, cooperative and appears stated age Head: Normocephalic, without obvious abnormality, atraumatic Neck: no adenopathy, supple, symmetrical, trachea midline and thyroid normal to inspection and palpation Lungs: clear to auscultation bilaterally Breasts: normal appearance, no masses or tenderness Heart: regular rate and rhythm Abdomen: soft, non-tender; bowel sounds normal; no masses,  no organomegaly Extremities: extremities normal, atraumatic, no cyanosis or edema Skin: Skin color, texture, turgor normal. No rashes or lesions Lymph nodes: Cervical, supraclavicular, and axillary nodes normal. No abnormal inguinal nodes palpated Neurologic: Grossly normal   Pelvic: External genitalia:  Right labia majora raised whitish lesion with scale that is new              Urethra:  normal appearing urethra with no masses, tenderness or lesions              Bartholins and Skenes: normal                 Vagina: normal appearing vagina with normal color and discharge, no lesions              Cervix: absent              Pap taken: No. Bimanual Exam:  Uterus:  uterus absent              Adnexa: no mass, fullness, tenderness               Rectovaginal: Confirms               Anus:  normal sphincter tone, no lesions  Procedure:  Verbal  consent obtained.  Area cleansed with Hibiclens.  Sterile technique  used throughout procedure.  Skin anesthestized with Lidocaine 1% plain; 1.22m.  Lot:  [id number].  Exp:  06/2020.  Lesion grasped with pick-ups and excised with scissors.  Adequate hemostasis obtained with silver nitrate sticks.  Dressing was not applied.  Pt tolerated procedure well.  Chaperone was present for exam.  A:  Well Woman with normal exam PMP, no HRT Breast 2008 then again last year.  H/O stage IA invasive ductal c/a 2019.  S/p lumpectomy with radiation and now anastrozole for five years.   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  Increased risk of cancer is for breast only (no ovarian) Hypertension Elevated lipids, on statin H/o TAH 1985, ovaries remain Ovarian cyst noted on CT Grief reaction due to husband's death Vulvar lesion  P:   Now having diagnostic MMG yearly.  Currently UTD. pap smear not indicated Blood work UTD with Dr. ESuzy BouchardDeclines Shingrix vaccine today Will have repeat BMD in 1-2 years Vulvar biopsy pending Return annually or prn

## 2019-01-05 ENCOUNTER — Other Ambulatory Visit: Payer: Self-pay

## 2019-01-05 ENCOUNTER — Ambulatory Visit (INDEPENDENT_AMBULATORY_CARE_PROVIDER_SITE_OTHER): Payer: Medicare Other | Admitting: Obstetrics & Gynecology

## 2019-01-05 ENCOUNTER — Encounter: Payer: Self-pay | Admitting: Obstetrics & Gynecology

## 2019-01-05 ENCOUNTER — Ambulatory Visit (INDEPENDENT_AMBULATORY_CARE_PROVIDER_SITE_OTHER): Payer: Medicare Other

## 2019-01-05 VITALS — BP 122/60 | HR 68 | Temp 97.1°F | Ht 62.0 in | Wt 212.0 lb

## 2019-01-05 DIAGNOSIS — N9489 Other specified conditions associated with female genital organs and menstrual cycle: Secondary | ICD-10-CM

## 2019-01-05 DIAGNOSIS — C562 Malignant neoplasm of left ovary: Secondary | ICD-10-CM

## 2019-01-05 DIAGNOSIS — N83202 Unspecified ovarian cyst, left side: Secondary | ICD-10-CM

## 2019-01-05 NOTE — Progress Notes (Signed)
68 y.o. G1P0 Married [place] female here for pelvic ultrasound due to cystic mas that was noted on CT done in ER for evaluation for diverticulitis.  Pt denies pelvic pain or vaginal bleed.  She has undergone a hysterectomy in in 1985 (TAH).  Patient's last menstrual period was 03/17/1983.  Contraception: TAH and menopause  Findings:  UTERUS: surgically absent EMS: n/a ADNEXA: Left ovary: 4.2 x 2.2 x 2.2cm with 3.2 x 2.7 x 3.3cm that is smooth walled and avascular       Right ovary: 1.7 x 1.2 x 1.2cm CUL DE SAC: no free fluid  Discussion:  Findings reviewed with pt.  Recommended ca-125 today and then repeating ultrasound in 6 and 12 months.  Up to date recommendations reviewed and these include follow up ultrasound in 3 and 6 months.  As this cyst is small and has completely benign appearing, feel it is ok to monitor this over six months intervals.  Pt comfortable with plan.  Assessment:  Left ovarian cyst lesion  Plan:  Ca 125 will be obtained today.  Repeat PUS in 6 and 12 months if ca-125 is normal.  Pt will be called with results and follow up scheduled at that time.  ~15 minutes spent with patient >50% of time was in face to face discussion of above.

## 2019-01-06 LAB — CA 125: Cancer Antigen (CA) 125: 9.5 U/mL (ref 0.0–38.1)

## 2019-01-06 NOTE — Addendum Note (Signed)
Addended by: Megan Salon on: 01/06/2019 12:53 PM   Modules accepted: Orders

## 2019-01-30 NOTE — Progress Notes (Signed)
[facility]  [phone] [phone]     ID: Katelyn Porter DOB: 1950/12/04  MR#: [id number]  NFA#:[id number]  Patient Care Team: [name], DO as PCP - General (Internal Medicine) Megan Salon, MD as Attending Physician (Obstetrics and Gynecology) Alanna Storti, Virgie Dad, MD as Consulting Physician (Oncology) Erroll Luna, MD as Consulting Physician (General Surgery) Gery Pray, MD as Consulting Physician (Radiation Oncology) Irene Shipper, MD as Consulting Physician (Gastroenterology) OTHER MD:  CHIEF COMPLAINT: Estrogen receptor positive breast cancer  CURRENT TREATMENT: Anastrozole   INTERVAL HISTORY: Katelyn Porter returns today for follow-up of her estrogen receptor positive breast cancer.   She continues on anastrozole with fair tolerance.  Hot flashes are a major problem.  She tells me she tried venlafaxine in the past and could not tolerate it.  On 10/22/2018, she presented to the ED with abdominal pain and nausea. She underwent chest x-ray that day, which showed no active cardiopulmonary disease.  She also underwent abdomen/pelvis CT, which showed: short segment of circumferential mucosal thickening of the sigmoid colon with pericolonic inflammatory changes and small amount of free fluid in the left para colic gutter; prominent diverticulum within the proximal portion of the abnormal bowel; 3.2 cm cystic-appearing left adnexal mass.  She presented to her GYN, Dr. Sabra Heck, on 12/29/2018 for routine exam. On pelvic exam, she was noted to have a raised whitish lesion on the right labia majora. Biopsy was taken at that time. Pathology from the procedure ([id number]) showed epidermolytic acanthoma, a benign skin lesion.  She returned on 01/05/2019 for pelvic ultrasound to follow up on the previously seen adnexal mass. Per Dr. Ammie Ferrier note, the cyst is small and completely benign-appearing. Recommendation was to perform Ca 125 and repeat pelvic  ultrasound in 6 and 12 months. Lab Results  Component Value Date   UXL244 9.5 01/05/2019    REVIEW OF SYSTEMS: Katelyn Porter is still a bit down having lost her husband almost exactly a year ago.  She is still living in the same house.  She has no indoor pets but she does have 2 horses, 57 years old, the other one 67 years old.  She feeds the horses and does yard work but has been neglectful of the housework she says although she does all her own cooking of course.  Recall she has no children.  She cannot attend church right now because of the current pandemic.  A detailed review of systems today was otherwise stable.   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  She was treated with surgery radiation and tamoxifen for 5 years and we released her from follow-up in 2013.  More recently she had routine screening mammography, on 06/16/2017 showing a possible abnormality in the right breast. She underwent unilateral right breast diagnostic mammography with tomography and right breast ultrasonography at [facility] on 06/21/2017 showing breast density category B. Suspicious new mass in the right breast 9 o'clock central and 4 cm from the nipple measuring 0.8 x 0.6 x 0.7 cm. There was no axillary adenopathy sonographically.   Accordingly on 06/22/2017 she proceeded to biopsy of the right breast area in question. The pathology from this procedure showed ([id number]): Breast, right, needle core biopsy, 9:15 o'clock, UOQ with invasive ductal carcinoma with extracellular mucin, grade 2. Prognostic indicators significant for: estrogen receptor, 100% positive and progesterone receptor, 5% positive, both with strong  staining intensity. Proliferation marker Ki67 at 20%. HER2 negative.  The patient's subsequent history is as detailed below.   PAST MEDICAL HISTORY: Past Medical History:  Diagnosis Date   . Breast cancer ([facility]) 2008   left, with radiation finished 08, tamoxifen  . Breast cancer ([facility]) 2019   Rt. had lymph node removed  having radiation currently  . Complication of anesthesia    woke up with colonoscopy and hysterectomy  . Depression 01/24/2013  . Family history of breast cancer   . Family history of colon cancer   . GERD (gastroesophageal reflux disease)   . Hyperlipidemia   . Hypertension   . IBS (irritable bowel syndrome)   . Palpitations   . Vertigo     PAST SURGICAL HISTORY: Past Surgical History:  Procedure Laterality Date  . ABDOMINAL HYSTERECTOMY  1985   TAH (ovaries remain)  . BREAST LUMPECTOMY Left 2007  . BREAST LUMPECTOMY Right 2019  . BREAST LUMPECTOMY WITH RADIOACTIVE SEED AND SENTINEL LYMPH NODE BIOPSY Right 07/22/2017   Procedure: BREAST LUMPECTOMY WITH RADIOACTIVE SEED AND SENTINEL LYMPH NODE BIOPSY;  Surgeon: Erroll Luna, MD;  Location: [facility];  Service: General;  Laterality: Right;  . COLONOSCOPY    . TONSILLECTOMY     age 42    FAMILY HISTORY Family History  Problem Relation Age of Onset  . Lung cancer Father        Died at 26 from lung cancer  . Heart disease Father        heart attack  . Colon cancer Mother 51  . Hypertension Mother   . Kidney disease Mother   . Diabetes Mother   . Hypertension Sister   . Diabetes Sister   . Colon polyps Sister   . Breast cancer Maternal Aunt        dx in her 43s  . Coronary artery disease Brother 22       died of "massive heart attack"  . Diabetes Brother   . Colon cancer Cousin        maternal 1st cousin dx over 84  . Ovarian cancer Paternal Grandmother   . Breast cancer Cousin        mat 1st cousin dx in her 61s  . Esophageal cancer Neg Hx   . Stomach cancer Neg Hx   . Rectal cancer Neg Hx   The patient's father had a history of tongue cancer which apparently spread to his lungs according to the patient. He passed due to a MI at age 22. The patient's mother died at  age 47 due to renal failure and diabetes.  She also had a history of colon cancer. The patient had 1 brother who died from an MI; and 1 sister.  There was a maternal aunt diagnosed with breast cancer in the 30's. A maternal cousin passed due to breast cancer. Another maternal cousin had colon cancer. She denies a family history of ovarian cancer.    GYNECOLOGIC HISTORY:  Patient's last menstrual period was 03/17/1983. Menarche: 68 years old. She is GXP0. Her LMP was age 43 after her hysterectomy with BSO. She took HRT until 2008, discontinued at the time of her initial breast cancer diagnosis.    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 Her husband, Marjory Lies, worked in [employer] before retiring. He passed away Feb 08, 2018 from a hemorraghic stroke. He had 2 children from a prior marriage, a daughter  Zigmund Daniel, who lives in [place] and works for [employer], and a son, Personnel officer, who works for the [employer] in [place], [place]. The patient has 3 step-grandchildren. She now lives alone. She attends [employer].     ADVANCED DIRECTIVES:    HEALTH MAINTENANCE: Social History   Tobacco Use  . Smoking status: Never Smoker  . Smokeless tobacco: Never Used  Substance Use Topics  . Alcohol use: No  . Drug use: No     Colonoscopy: UTD/Dr. Perry/ normal  PAP: s/p TAHBSO  Bone density:   Allergies  Allergen Reactions  . Crab [Shellfish Allergy] Swelling    Blisters on tongue.  . Sulfa Antibiotics Other (See Comments)    headache  . Lipitor [Atorvastatin]     Muscle cramping  . Sulfa Drugs Cross Reactors     Current Outpatient Medications  Medication Sig Dispense Refill  . anastrozole (ARIMIDEX) 1 MG tablet Take 1 tablet (1 mg total) by mouth daily. 90 tablet 4  . Cholecalciferol (VITAMIN D3) 1000 units CAPS Take 1 capsule by mouth daily.    . cloNIDine (CATAPRES) 0.1 MG  tablet Take 0.1 mg by mouth at bedtime.    . DULoxetine (CYMBALTA) 60 MG capsule Take 30 mg by mouth 2 (two) times daily.     Marland Kitchen gabapentin (NEURONTIN) 100 MG capsule Take 1 capsule (100 mg total) by mouth at bedtime. 30 capsule 3  . Multiple Vitamins-Minerals (ONE-A-DAY WOMENS 50 PLUS PO) Take 1 tablet by mouth daily.    . pantoprazole (PROTONIX) 40 MG tablet Take 1 tablet (40 mg total) by mouth daily. 90 tablet 3  . rosuvastatin (CRESTOR) 20 MG tablet Take 20 mg by mouth daily.    Marland Kitchen triamterene-hydrochlorothiazide (DYAZIDE) 37.5-25 MG capsule Take by mouth.     Current Facility-Administered Medications  Medication Dose Route Frequency Provider Last Rate Last Dose  . 0.9 %  sodium chloride infusion  500 mL Intravenous Once Irene Shipper, MD        OBJECTIVE: Middle-aged African-American woman who appears stated age  28:   01/31/19 0928  BP: (!) 145/55  Pulse: 71  Resp: 18  Temp: 98.6 F (37 C)  SpO2: 100%     Body mass index is 39.6 kg/m.   Wt Readings from Last 3 Encounters:  01/31/19 216 lb 8 oz (98.2 kg)  01/05/19 212 lb (96.2 kg)  12/29/18 207 lb (93.9 kg)      ECOG FS:1 - Symptomatic but completely ambulatory  Sclerae unicteric, EOMs intact Wearing a mask No cervical or supraclavicular adenopathy Lungs no rales or rhonchi Heart regular rate and rhythm Abd soft, nontender, positive bowel sounds MSK no focal spinal tenderness, no upper extremity lymphedema Neuro: nonfocal, well oriented, appropriate affect Breasts: The right breast has undergone lumpectomy and radiation.  There is minimal residual hyperpigmentation.  There is no evidence of disease recurrence.  Both axillae are benign.   LAB RESULTS:  CMP     Component Value Date/Time   NA 137 10/22/2018 2027   K 3.4 (L) 10/22/2018 2027   CL 102 10/22/2018 2027   CO2 24 10/22/2018 2027   GLUCOSE 113 (H) 10/22/2018 2027   BUN 13 10/22/2018 2027   CREATININE 0.80 10/22/2018 2027   CREATININE 0.94  08/23/2018 1006   CREATININE 0.94 04/13/2013 1411   CALCIUM 9.5 10/22/2018 2027   PROT 7.4 10/22/2018 2027   ALBUMIN 4.4 10/22/2018 2027   AST 21 10/22/2018 2027   AST 16  08/23/2018 1006   ALT 20 10/22/2018 2027   ALT 16 08/23/2018 1006   ALKPHOS 84 10/22/2018 2027   BILITOT 1.2 10/22/2018 2027   BILITOT 0.7 08/23/2018 1006   GFRNONAA >60 10/22/2018 2027   GFRNONAA >60 08/23/2018 1006   GFRNONAA 68 01/24/2013 0942   GFRAA >60 10/22/2018 2027   GFRAA >60 08/23/2018 1006   GFRAA 79 01/24/2013 0942    No results found for: TOTALPROTELP, ALBUMINELP, A1GS, A2GS, BETS, BETA2SER, GAMS, MSPIKE, SPEI  No results found for: KPAFRELGTCHN, LAMBDASER, KAPLAMBRATIO  Lab Results  Component Value Date   WBC 5.0 01/31/2019   NEUTROABS 2.8 01/31/2019   HGB 13.4 01/31/2019   HCT 41.3 01/31/2019   MCV 93.0 01/31/2019   PLT 230 01/31/2019    Lab Results  Component Value Date   LABCA2 30 07/28/2010    No components found for: CVELFY101  No results for input(s): INR in the last 168 hours.  Lab Results  Component Value Date   LABCA2 30 07/28/2010    No results found for: CAN199  Lab Results  Component Value Date   CAN125 9.5 01/05/2019    No results found for: BPZ025  No results found for: CA2729  No components found for: HGQUANT  No results found for: CEA1 / No results found for: CEA1   No results found for: AFPTUMOR  No results found for: [place]  No results found for: PSA1  Appointment on 01/31/2019  Component Date Value Ref Range Status  . WBC 01/31/2019 5.0  4.0 - 10.5 K/uL Final  . RBC 01/31/2019 4.44  3.87 - 5.11 MIL/uL Final  . Hemoglobin 01/31/2019 13.4  12.0 - 15.0 g/dL Final  . HCT 01/31/2019 41.3  36.0 - 46.0 % Final  . MCV 01/31/2019 93.0  80.0 - 100.0 fL Final  . MCH 01/31/2019 30.2  26.0 - 34.0 pg Final  . MCHC 01/31/2019 32.4  30.0 - 36.0 g/dL Final  . RDW 01/31/2019 12.6  11.5 - 15.5 % Final  . Platelets 01/31/2019 230  150 - 400 K/uL Final   . nRBC 01/31/2019 0.0  0.0 - 0.2 % Final  . Neutrophils Relative % 01/31/2019 55  % Final  . Neutro Abs 01/31/2019 2.8  1.7 - 7.7 K/uL Final  . Lymphocytes Relative 01/31/2019 32  % Final  . Lymphs Abs 01/31/2019 1.6  0.7 - 4.0 K/uL Final  . Monocytes Relative 01/31/2019 10  % Final  . Monocytes Absolute 01/31/2019 0.5  0.1 - 1.0 K/uL Final  . Eosinophils Relative 01/31/2019 2  % Final  . Eosinophils Absolute 01/31/2019 0.1  0.0 - 0.5 K/uL Final  . Basophils Relative 01/31/2019 1  % Final  . Basophils Absolute 01/31/2019 0.0  0.0 - 0.1 K/uL Final  . Immature Granulocytes 01/31/2019 0  % Final  . Abs Immature Granulocytes 01/31/2019 0.01  0.00 - 0.07 K/uL Final   Performed at [facility], [facility], [place] [address] [place], [place] [address]    (this displays the last labs from the last 3 days)  No results found for: TOTALPROTELP, ALBUMINELP, A1GS, A2GS, BETS, BETA2SER, GAMS, MSPIKE, SPEI (this displays SPEP labs)  No results found for: KPAFRELGTCHN, LAMBDASER, KAPLAMBRATIO (kappa/lambda light chains)  No results found for: HGBA, HGBA2QUANT, HGBFQUANT, HGBSQUAN (Hemoglobinopathy evaluation)   No results found for: LDH  No results found for: IRON, TIBC, IRONPCTSAT (Iron and TIBC)  No results found for: FERRITIN  Urinalysis    Component Value Date/Time   COLORURINE YELLOW  10/22/2018 2130   APPEARANCEUR CLEAR 10/22/2018 2130   LABSPEC <1.005 (L) 10/22/2018 2130   PHURINE 6.5 10/22/2018 2130   GLUCOSEU NEGATIVE 10/22/2018 2130   HGBUR NEGATIVE 10/22/2018 2130   BILIRUBINUR NEGATIVE 10/22/2018 2130   BILIRUBINUR n 04/13/2013 [place] 10/22/2018 2130   PROTEINUR NEGATIVE 10/22/2018 2130   UROBILINOGEN negative 04/13/2013 1318   NITRITE NEGATIVE 10/22/2018 2130   LEUKOCYTESUR NEGATIVE 10/22/2018 2130     STUDIES: US Pelvis Transvaginal Non-ob (tv Only)  Result Date: 01/05/2019 SEE PROGRESS NOTE    ELIGIBLE FOR AVAILABLE RESEARCH  PROTOCOL: no  ASSESSMENT: 68 y.o. [place] , [place] woman status post left lumpectomy February 2008 for a grade 3 ductal carcinoma in situ which was strongly estrogen and progesterone receptor positive.  (a)   adjuvant radiation completed May 2008  (b)  on tamoxifen July 2008 through March 2013.  (1) status post right breast upper outer quadrant biopsy 06/22/2017 for a clinnical T1b N0, stage IA invasive ductal carcinoma, with extracellular mucin; grade 2; estrogen and progesterone receptor positive, HER-2 not amplified, with an MIB-1 of 20%.  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 The following genes were evaluated for sequence changes only: HOXB13*, NTHL1*, SDHA   (3) Oncotype obtained from the 06/22/2017 biopsy showed a score of 25, predicting a risk of recurrence outside the breast over the next 9 years of 12% if the patient took antiestrogens for 5 years.  It also shows no benefit from chemotherapy.  (4) status post right lumpectomy and sentinel lymph node sampling 07/22/2017 for a pT1b pN0, stage Ia invasive ductal carcinoma, grade 2, with extracellular mucin, and negative margins.  (a) a total of 3 lymph nodes were removed  (5) adjuvant radiation 08/31/2017 to 10/19/2017 Site/dose:    1. The Right breast was treated to 50.4 Gy in 28 fractions of 1.8 Gy. 2. The Right breast was boosted to 10 Gy in 5 fractions of 2 Gy.  (6) started anastrozole 11/14/2017  (a) bone density December 2020  (7)  intensified screening: The patient will have yearly breast MRI later in the year and yearly mammography April or May   PLAN: Katelyn Porter is now a year and a half out from definitive surgery for her breast cancer with no evidence of disease recurrence.  This is very favorable.  She is tolerating anastrozole well and the plan is to continue that for 5 years.  I have encouraged her to start thinking ahead to April or May by which time everyone hopefully will have had their vaccine, she will be able to get involved with her church, and hopefully needs someone that she can share her life with.  She is already scheduled for bone density in December.  I have added a breast MRI for intensified screening.  She knows to call for any other issue that may develop before the next visit.   Valkyrie Guardiola, Virgie Dad, MD  01/31/19 9:48 AM Medical Oncology and Hematology [facility] [place], [place] [address] Tel. [phone]  Fax. [phone]   I, Wilburn Mylar, am acting as scribe for Dr. Virgie Dad. Austyn Seier.  I, Lurline Del MD, have reviewed the above documentation for accuracy and completeness, and I agree with the above.

## 2019-01-31 ENCOUNTER — Other Ambulatory Visit: Payer: Self-pay

## 2019-01-31 ENCOUNTER — Inpatient Hospital Stay: Payer: Medicare Other

## 2019-01-31 ENCOUNTER — Inpatient Hospital Stay: Payer: Medicare Other | Attending: Oncology | Admitting: Oncology

## 2019-01-31 VITALS — BP 145/55 | HR 71 | Temp 98.6°F | Resp 18 | Ht 62.0 in | Wt 216.5 lb

## 2019-01-31 DIAGNOSIS — D0512 Intraductal carcinoma in situ of left breast: Secondary | ICD-10-CM

## 2019-01-31 DIAGNOSIS — Z1509 Genetic susceptibility to other malignant neoplasm: Secondary | ICD-10-CM

## 2019-01-31 DIAGNOSIS — C50911 Malignant neoplasm of unspecified site of right female breast: Secondary | ICD-10-CM

## 2019-01-31 DIAGNOSIS — C50411 Malignant neoplasm of upper-outer quadrant of right female breast: Secondary | ICD-10-CM

## 2019-01-31 DIAGNOSIS — Z1589 Genetic susceptibility to other disease: Secondary | ICD-10-CM | POA: Diagnosis not present

## 2019-01-31 DIAGNOSIS — Z79811 Long term (current) use of aromatase inhibitors: Secondary | ICD-10-CM | POA: Insufficient documentation

## 2019-01-31 DIAGNOSIS — Z1501 Genetic susceptibility to malignant neoplasm of breast: Secondary | ICD-10-CM

## 2019-01-31 DIAGNOSIS — C50912 Malignant neoplasm of unspecified site of left female breast: Secondary | ICD-10-CM

## 2019-01-31 DIAGNOSIS — Z170 Estrogen receptor positive status [ER+]: Secondary | ICD-10-CM | POA: Insufficient documentation

## 2019-01-31 HISTORY — DX: Genetic susceptibility to other malignant neoplasm: Z15.09

## 2019-01-31 HISTORY — DX: Genetic susceptibility to other disease: Z15.01

## 2019-01-31 LAB — CBC WITH DIFFERENTIAL/PLATELET
Abs Immature Granulocytes: 0.01 10*3/uL (ref 0.00–0.07)
Basophils Absolute: 0 10*3/uL (ref 0.0–0.1)
Basophils Relative: 1 %
Eosinophils Absolute: 0.1 10*3/uL (ref 0.0–0.5)
Eosinophils Relative: 2 %
HCT: 41.3 % (ref 36.0–46.0)
Hemoglobin: 13.4 g/dL (ref 12.0–15.0)
Immature Granulocytes: 0 %
Lymphocytes Relative: 32 %
Lymphs Abs: 1.6 10*3/uL (ref 0.7–4.0)
MCH: 30.2 pg (ref 26.0–34.0)
MCHC: 32.4 g/dL (ref 30.0–36.0)
MCV: 93 fL (ref 80.0–100.0)
Monocytes Absolute: 0.5 10*3/uL (ref 0.1–1.0)
Monocytes Relative: 10 %
Neutro Abs: 2.8 10*3/uL (ref 1.7–7.7)
Neutrophils Relative %: 55 %
Platelets: 230 10*3/uL (ref 150–400)
RBC: 4.44 MIL/uL (ref 3.87–5.11)
RDW: 12.6 % (ref 11.5–15.5)
WBC: 5 10*3/uL (ref 4.0–10.5)
nRBC: 0 % (ref 0.0–0.2)

## 2019-01-31 LAB — COMPREHENSIVE METABOLIC PANEL
ALT: 23 U/L (ref 0–44)
AST: 16 U/L (ref 15–41)
Albumin: 3.8 g/dL (ref 3.5–5.0)
Alkaline Phosphatase: 76 U/L (ref 38–126)
Anion gap: 11 (ref 5–15)
BUN: 13 mg/dL (ref 8–23)
CO2: 24 mmol/L (ref 22–32)
Calcium: 9.1 mg/dL (ref 8.9–10.3)
Chloride: 107 mmol/L (ref 98–111)
Creatinine, Ser: 0.92 mg/dL (ref 0.44–1.00)
GFR calc Af Amer: >60 mL/min (ref >60)
GFR calc non Af Amer: >60 mL/min (ref >60)
Glucose, Bld: 107 mg/dL — ABNORMAL HIGH (ref 70–99)
Potassium: 3.8 mmol/L (ref 3.5–5.1)
Sodium: 142 mmol/L (ref 135–145)
Total Bilirubin: 0.7 mg/dL (ref 0.3–1.2)
Total Protein: 6.5 g/dL (ref 6.5–8.1)

## 2019-02-01 ENCOUNTER — Telehealth: Payer: Self-pay | Admitting: Oncology

## 2019-02-01 NOTE — Telephone Encounter (Signed)
I talk with patient regarding schedule  

## 2019-02-24 ENCOUNTER — Encounter: Payer: Self-pay | Admitting: Oncology

## 2019-02-24 ENCOUNTER — Ambulatory Visit
Admission: RE | Admit: 2019-02-24 | Discharge: 2019-02-24 | Disposition: A | Discharge status: Home / Self Care | Payer: Medicare Other | Source: Ambulatory Visit | Attending: Oncology | Admitting: Oncology

## 2019-02-24 DIAGNOSIS — C50912 Malignant neoplasm of unspecified site of left female breast: Secondary | ICD-10-CM

## 2019-02-24 DIAGNOSIS — Z170 Estrogen receptor positive status [ER+]: Secondary | ICD-10-CM

## 2019-02-24 DIAGNOSIS — Z1589 Genetic susceptibility to other disease: Secondary | ICD-10-CM

## 2019-02-24 DIAGNOSIS — C50411 Malignant neoplasm of upper-outer quadrant of right female breast: Secondary | ICD-10-CM

## 2019-02-24 DIAGNOSIS — D0512 Intraductal carcinoma in situ of left breast: Secondary | ICD-10-CM

## 2019-02-24 DIAGNOSIS — Z1501 Genetic susceptibility to malignant neoplasm of breast: Secondary | ICD-10-CM

## 2019-02-24 MED ORDER — GADOBUTROL 1 MMOL/ML IV SOLN
10.0000 mL | Freq: Once | INTRAVENOUS | Status: AC | PRN
  Administered 2019-02-24: 10 mL via INTRAVENOUS

## 2019-02-27 DIAGNOSIS — H6121 Impacted cerumen, right ear: Secondary | ICD-10-CM | POA: Insufficient documentation

## 2019-02-27 HISTORY — DX: Impacted cerumen, right ear: H61.21

## 2019-03-31 ENCOUNTER — Encounter: Payer: Self-pay | Admitting: Genetic Counselor

## 2019-05-24 ENCOUNTER — Other Ambulatory Visit: Payer: Self-pay | Admitting: Obstetrics & Gynecology

## 2019-05-24 DIAGNOSIS — Z853 Personal history of malignant neoplasm of breast: Secondary | ICD-10-CM

## 2019-05-24 DIAGNOSIS — Z9889 Other specified postprocedural states: Secondary | ICD-10-CM

## 2019-05-26 ENCOUNTER — Ambulatory Visit: Payer: Medicare HMO | Admitting: Physician Assistant

## 2019-05-26 ENCOUNTER — Encounter: Payer: Self-pay | Admitting: Physician Assistant

## 2019-05-26 VITALS — BP 110/56 | HR 80 | Temp 98.3°F | Ht 62.0 in | Wt 215.8 lb

## 2019-05-26 DIAGNOSIS — K6289 Other specified diseases of anus and rectum: Secondary | ICD-10-CM

## 2019-05-26 DIAGNOSIS — R140 Abdominal distension (gaseous): Secondary | ICD-10-CM | POA: Diagnosis not present

## 2019-05-26 MED ORDER — RIFAXIMIN 550 MG PO TABS: 550.0000 mg | ORAL_TABLET | Freq: Three times a day (TID) | ORAL | 0 refills | Status: AC

## 2019-05-26 NOTE — Patient Instructions (Addendum)
If you are age 68 or older, your body mass index should be between 23-30. Your Body mass index is 39.47 kg/m. If this is out of the aforementioned range listed, please consider follow up with your Primary Care Provider.  If you are age 17 or younger, your body mass index should be between 19-25. Your Body mass index is 39.47 kg/m. If this is out of the aformentioned range listed, please consider follow up with your Primary Care Provider.   We have sent the following medications to your pharmacy for you to pick up at your convenience: Xifaxin 550 mg three times daily for 2 weeks.  May try Recticare w/Lidocaine OTC.   Call with update.  Follow up in 1-2 months and call in mid-April to schedule appointment.

## 2019-05-26 NOTE — Progress Notes (Signed)
Chief Complaint: Gas and bloating, rectal pain  HPI:    [name] is a 69 year old African-American female with a past medical history as listed below including IBS known to Dr. Henrene Pastor, who presents clinic today with a complaint of gas and bloating rectal pain.    09/07/2017 colonoscopy with a 15 mm polyp in the sigmoid colon, three 2-8 mm polyps in the transverse, ascending colon and cecum.  Diverticulosis in the sigmoid colon.    08/04/2018 patient seen in clinic by Dr. Henrene Pastor for bloating.  At that time given Metronidazole 250 3 times daily for 2 weeks for suspected SIBO.  Also scheduled for gastric emptying scan.    08/15/2018 gastric emptying study was normal.    Today, the patient tells me that after being treated with the Metronidazole she did feel better for about 2 weeks but then all of her gas came back.  It is so bad that she wakes up in the middle of the night with flatulence.  Tells me that she wants to "get back on the dating scene", and this is prohibiting her.  Tells me anything she eats seems to just blow her up.  Some foods are worse than others.    Also describes some rectal pain today which feels like a "tear on the outside".  Tells me this is typically after having a bowel movement that she feels this pain and will sometimes have to wipe with Preparation H wipes which typically makes it feel better.  Denies seeing any blood in her stool.    Denies fever, chills or weight loss.  Past Medical History:  Diagnosis Date  . Breast cancer ([place]) 2008   left, with radiation finished 08, tamoxifen  . Breast cancer ([place]) 2019   Rt. had lymph node removed  having radiation currently  . Complication of anesthesia    woke up with colonoscopy and hysterectomy  . Depression 01/24/2013  . Family history of breast cancer   . Family history of colon cancer   . GERD (gastroesophageal reflux disease)   . Hyperlipidemia   . Hypertension   . IBS (irritable bowel syndrome)   .  Palpitations   . Vertigo     Past Surgical History:  Procedure Laterality Date  . ABDOMINAL HYSTERECTOMY  1985   TAH (ovaries remain)  . BREAST LUMPECTOMY Left 2007  . BREAST LUMPECTOMY Right 2019  . BREAST LUMPECTOMY WITH RADIOACTIVE SEED AND SENTINEL LYMPH NODE BIOPSY Right 07/22/2017   Procedure: BREAST LUMPECTOMY WITH RADIOACTIVE SEED AND SENTINEL LYMPH NODE BIOPSY;  Surgeon: Erroll Luna, MD;  Location: [facility];  Service: General;  Laterality: Right;  . COLONOSCOPY    . TONSILLECTOMY     age 48    Current Outpatient Medications  Medication Sig Dispense Refill  . anastrozole (ARIMIDEX) 1 MG tablet Take 1 tablet (1 mg total) by mouth daily. 90 tablet 4  . Cholecalciferol (VITAMIN D3) 1000 units CAPS Take 1 capsule by mouth daily.    . cloNIDine (CATAPRES) 0.1 MG tablet Take 0.1 mg by mouth at bedtime.    . DULoxetine (CYMBALTA) 60 MG capsule Take 30 mg by mouth 2 (two) times daily.     . Multiple Vitamins-Minerals (ONE-A-DAY WOMENS 50 PLUS PO) Take 1 tablet by mouth daily.    . pantoprazole (PROTONIX) 40 MG tablet Take 1 tablet (40 mg total) by mouth daily. 90 tablet 3  . rosuvastatin (CRESTOR) 20 MG tablet Take 20 mg by mouth daily.    Marland Kitchen  triamterene-hydrochlorothiazide (DYAZIDE) 37.5-25 MG capsule Take by mouth.     No current facility-administered medications for this visit.    Allergies as of 05/26/2019 - Review Complete 05/26/2019  Allergen Reaction Noted  . Crab [shellfish allergy] Swelling 06/22/2012  . Sulfa antibiotics Other (See Comments) 07/03/2014  . Lipitor [atorvastatin]  02/03/2013  . Sulfa drugs cross reactors  07/22/2010    Family History  Problem Relation Age of Onset  . Lung cancer Father        Died at 82 from lung cancer  . Heart disease Father        heart attack  . Colon cancer Mother 54  . Hypertension Mother   . Kidney disease Mother   . Diabetes Mother   . Hypertension Sister   . Diabetes Sister   . Colon polyps Sister     . Breast cancer Maternal Aunt        dx in her 39s  . Coronary artery disease Brother 78       died of "massive heart attack"  . Diabetes Brother   . Colon cancer Cousin        maternal 1st cousin dx over 2  . Ovarian cancer Paternal Grandmother   . Breast cancer Cousin        mat 1st cousin dx in her 11s  . Esophageal cancer Neg Hx   . Stomach cancer Neg Hx   . Rectal cancer Neg Hx     Social History   Socioeconomic History  . Marital status: Married    Spouse name: Not on file  . Number of children: 0  . Years of education: Not on file  . Highest education level: Not on file  Occupational History  . Occupation: accounts payable    Employer: [employer]  Tobacco Use  . Smoking status: Never Smoker  . Smokeless tobacco: Never Used  Substance and Sexual Activity  . Alcohol use: No  . Drug use: No  . Sexual activity: Not Currently    Birth control/protection: Surgical  Other Topics Concern  . Not on file  Social History Narrative   No children   Married   Enjoys quilting and horseback riding.    She works in Pension scheme manager.   She completed 2 years of college.    Social Determinants of Health   Financial Resource Strain:   . Difficulty of Paying Living Expenses:   Food Insecurity:   . Worried About Charity fundraiser in the Last Year:   . Arboriculturist in the Last Year:   Transportation Needs:   . Film/video editor (Medical):   Marland Kitchen Lack of Transportation (Non-Medical):   Physical Activity:   . Days of Exercise per Week:   . Minutes of Exercise per Session:   Stress:   . Feeling of Stress :   Social Connections:   . Frequency of Communication with Friends and Family:   . Frequency of Social Gatherings with Friends and Family:   . Attends Religious Services:   . Active Member of Clubs or Organizations:   . Attends Archivist Meetings:   Marland Kitchen Marital Status:   Intimate Partner Violence:   . Fear of Current or Ex-Partner:   .  Emotionally Abused:   Marland Kitchen Physically Abused:   . Sexually Abused:     Review of Systems:    Constitutional: No weight loss, fever or chills Cardiovascular: No chest pain  Respiratory: No SOB  Gastrointestinal:  See HPI and otherwise negative   Physical Exam:  Vital signs: BP (!) 110/56   Pulse 80   Temp 98.3 F (36.8 C)   Ht 5\' 2"  (1.575 m)   Wt 215 lb 12.8 oz (97.9 kg)   LMP 03/17/1983   BMI 39.47 kg/m   Constitutional:   Pleasant AA female appears to be in NAD, Well developed, Well nourished, alert and cooperative Respiratory: Respirations even and unlabored. Lungs clear to auscultation bilaterally.   No wheezes, crackles, or rhonchi.  Cardiovascular: Normal S1, S2. No MRG. Regular rate and rhythm. No peripheral edema, cyanosis or pallor.  Gastrointestinal:  Soft, nondistended, nontender. No rebound or guarding. Normal bowel sounds. No appreciable masses or hepatomegaly. Rectal:  External: no fissure, hemorrhoid or tag; Internal: no mass; Anoscopy: no fissure, no ttp Psychiatric: Demonstrates good judgement and reason without abnormal affect or behaviors.  No recent labs/imaging.  Assessment: 1.  Bloating/SIBO: Has been treated in the past successfully with Metronidazole but results only last for 1 to 2 weeks  2.  Rectal pain: Sharp per the patient, nothing on rectal exam today; consider fissure which is now healed versus other  Plan: 1.  Discussed with patient did not see anything on her rectal exam.  It could be a fissure that is healed.  Recommend she try RectiCare cream with lidocaine over-the-counter as needed for pain.  If she continues to discomfort could discuss a flex sigmoidoscopy for further evaluation. 2.  Prescribed Xifaxan 550 mg 3 times daily x2 weeks for SIBO and bloating.  Hopefully this will working better than the Metronidazole. 3.  Patient to follow in clinic with me in 6 weeks or sooner if necessary.  Ellouise Newer, PA-C [facility] 05/26/2019, 2:09 PM  Cc: [name], DO

## 2019-05-27 NOTE — Progress Notes (Signed)
Noted  

## 2019-06-07 ENCOUNTER — Telehealth: Payer: Self-pay | Admitting: Obstetrics & Gynecology

## 2019-06-07 NOTE — Telephone Encounter (Signed)
Call to patient. Per DPR, OK to leave message on voicemail.  Left voicemail requesting a return call to [facility] to review benefits for scheduled Pelvic ultrasound with M. Suzanne Miller, MD 

## 2019-06-07 NOTE — Telephone Encounter (Signed)
Spoke with patient regarding benefits for recommended ultrasound. Patient is aware that ultrasound is transvaginal. Patient acknowledges understanding of information presented. Patient is aware of cancellation policy. Encounter closed. °

## 2019-06-26 ENCOUNTER — Other Ambulatory Visit: Payer: Self-pay

## 2019-06-26 ENCOUNTER — Ambulatory Visit
Admission: RE | Admit: 2019-06-26 | Discharge: 2019-06-26 | Disposition: A | Discharge status: Home / Self Care | Payer: Medicare Other | Source: Ambulatory Visit | Attending: Obstetrics & Gynecology | Admitting: Obstetrics & Gynecology

## 2019-06-26 DIAGNOSIS — Z9889 Other specified postprocedural states: Secondary | ICD-10-CM

## 2019-06-26 DIAGNOSIS — Z853 Personal history of malignant neoplasm of breast: Secondary | ICD-10-CM

## 2019-07-05 ENCOUNTER — Other Ambulatory Visit: Payer: Self-pay

## 2019-07-06 ENCOUNTER — Ambulatory Visit (INDEPENDENT_AMBULATORY_CARE_PROVIDER_SITE_OTHER): Payer: Medicare HMO

## 2019-07-06 ENCOUNTER — Ambulatory Visit (INDEPENDENT_AMBULATORY_CARE_PROVIDER_SITE_OTHER): Payer: Medicare HMO | Admitting: Obstetrics & Gynecology

## 2019-07-06 ENCOUNTER — Encounter: Payer: Self-pay | Admitting: Obstetrics & Gynecology

## 2019-07-06 ENCOUNTER — Other Ambulatory Visit: Payer: Self-pay

## 2019-07-06 VITALS — BP 110/72 | HR 68 | Temp 97.2°F | Resp 16 | Wt 212.0 lb

## 2019-07-06 DIAGNOSIS — N9489 Other specified conditions associated with female genital organs and menstrual cycle: Secondary | ICD-10-CM

## 2019-07-06 DIAGNOSIS — N83202 Unspecified ovarian cyst, left side: Secondary | ICD-10-CM

## 2019-07-06 DIAGNOSIS — C562 Malignant neoplasm of left ovary: Secondary | ICD-10-CM

## 2019-07-06 NOTE — Progress Notes (Signed)
69 y.o. G1P0 Married [place] or [place] American female here for pelvic ultrasound due to left ovarian cyst that was noted on CT of abdomen/pelvis.  Had Ca-125 12/2018 that was 9.  Denies any pain.    Contraception: hysterectomy  Findings:  UTERUS: surgically absent EMS: n/a ADNEXA: Left ovary: 4.0 x 2.0 x 2.0 with 3.3 x 2.6 x 3.1cm.  Cyst is thin walled, echo-free, avascular.         Right ovary: 1.7 x 1.3 x 1.2cm CUL DE SAC: no free fluid  Discussion:  Ultrasonographer supervised.  Findings reviewed.  Ca-125 reviewed.  D/w pt recommendation for following for two years to prove stability.  She is having no pain.  Will plan to repeat in 6 months.  Future order placed.  Assessment:  Left ovarian cyst, 3.3cm in largest dimension today  Plan:  Repeat PUS in 6 months.  Order placed.

## 2019-07-08 IMAGING — MG MM CLIP PLACEMENT
2 series · 2 of 2 positions shown · non-contrast
Comparison: Previous exam(s).

CLINICAL DATA: Status post right breast ultrasound-guided biopsy.

EXAM:
DIAGNOSTIC RIGHT MAMMOGRAM POST ULTRASOUND BIOPSY

[R ML]
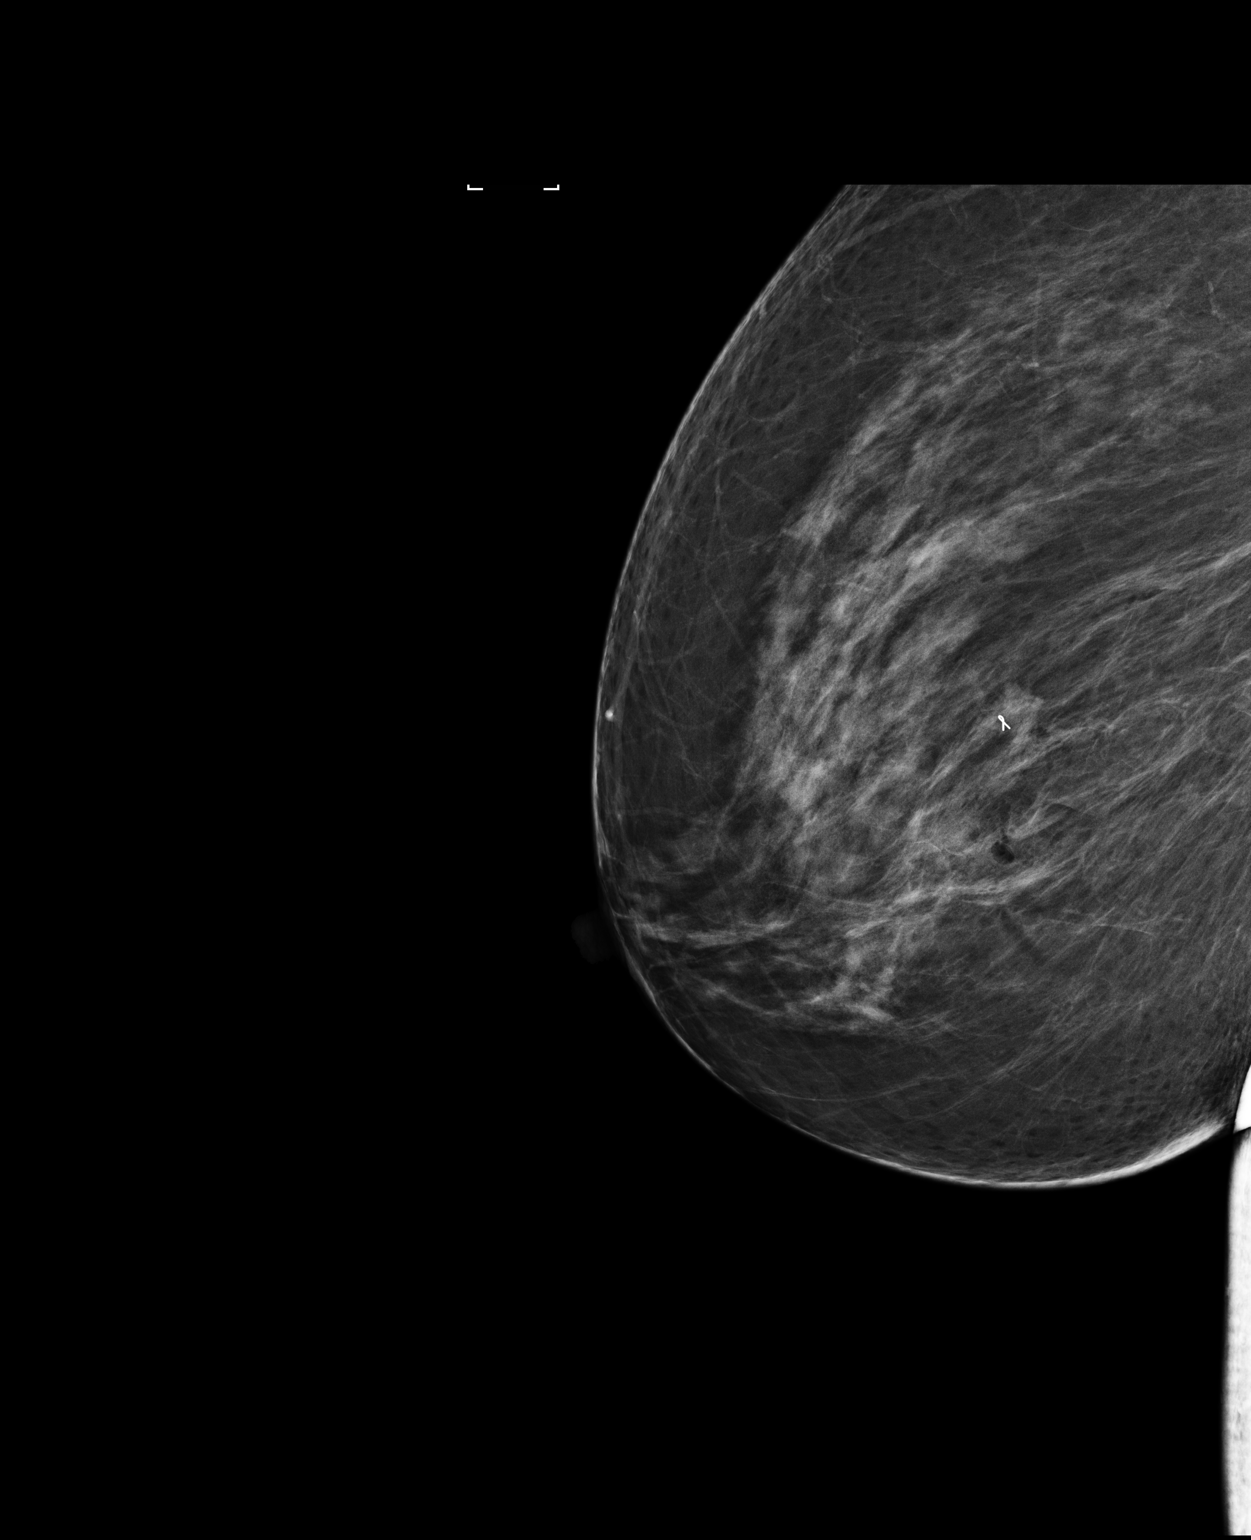

[R CC]
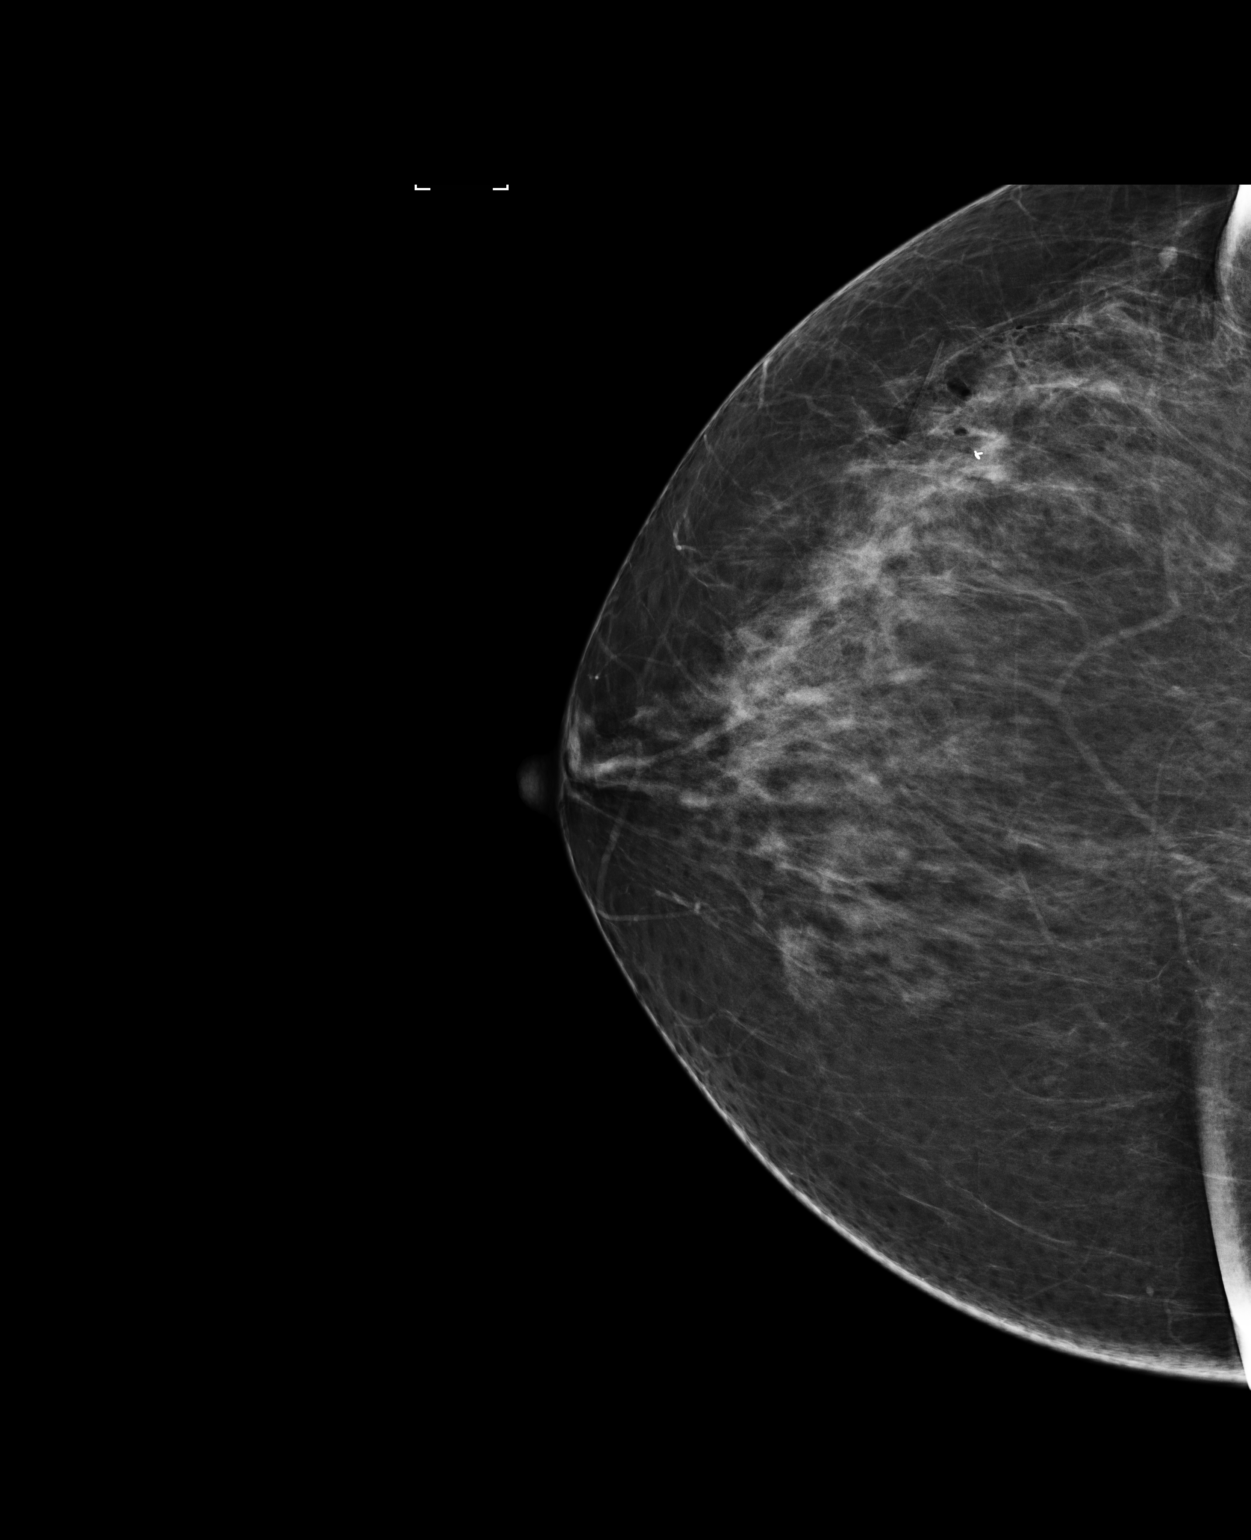

[2 of 2 positions shown; findings below may reference images not displayed]

FINDINGS: Mammographic images were obtained following ultrasound guided biopsy
of a small lateral right breast mass. The ribbon shaped biopsy clip
lies the anterior margin of the mass.
IMPRESSION: Well-positioned ribbon shaped biopsy clip following
ultrasound-guided core needle biopsy of a lateral right breast mass.

Final Assessment: Post Procedure Mammograms for Marker Placement

## 2019-08-06 NOTE — Progress Notes (Signed)
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   She continues on anastrozole with fair tolerance.  Hot flashes continue to be an issue.  She tried venlafaxine in the past and could not tolerate it.  She underwent baseline bone density screening at [facility] on 02/20/2019. This showed a T-score of +1.9, which is of course considered normal.  She underwent breast MRI on 02/24/2019 showing: breast composition B; no evidence of malignancy in either breast.  She also underwent bilateral diagnostic mammography with tomography at [facility] on 06/26/2019 showing: breast density category B; no evidence of malignancy in either breast.   Finally, she underwent pelvic ultrasound on 07/06/2019 for follow up of a previously noted left ovarian cyst. The cyst measured 3.3 cm at that time, which is stable from prior ultrasound in 12/2018.   REVIEW OF SYSTEMS: Katelyn Porter does the gym in the winter and does a lot of gardening in the summer.  She has tinnitus which has been evaluated by ENT without resolution.  She has a little bit of numbness and tingling in her hand particularly in the morning when she wakes up.  She is getting used to living by herself.  She does have a  sister in town, as well as a niece and a great niece and of course her stepdaughter is also not far.  She is working on losing her weight by decreasing her appetite.  She tells me she has lost 17 pounds on her scales.Marland Kitchen  Her weight on our scales is very variable but in November 2020 she weighed 216 pounds and she weighs 206 pounds currently.  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  She was treated with surgery radiation and tamoxifen for 5 years and we released her from follow-up in 2013.  More recently she had routine screening mammography, on 06/16/2017 showing a possible abnormality in the right breast. She underwent unilateral right breast diagnostic mammography with tomography and right breast ultrasonography at [facility] on 06/21/2017 showing breast density category B. Suspicious new mass in the right breast 9 o'clock central and 4 cm from the nipple measuring 0.8 x 0.6 x 0.7 cm. There was no axillary adenopathy sonographically.   Accordingly on 06/22/2017 she proceeded to biopsy of the right breast area in question. The pathology from this procedure showed ([id number]): Breast, right, needle core biopsy, 9:15 o'clock, UOQ with invasive ductal carcinoma with extracellular mucin, grade 2. Prognostic indicators significant for: estrogen receptor, 100% positive and progesterone receptor, 5% positive, both with strong staining intensity. Proliferation marker Ki67 at 20%. HER2 negative.  The patient's subsequent history is as detailed below.   PAST MEDICAL HISTORY: Past Medical History:  Diagnosis Date  . Breast  cancer [facility]) 2008   left, with radiation finished 08, tamoxifen  . Breast cancer ([place]) 2019   Rt. had lymph node removed  having radiation currently  . Complication of anesthesia    woke up with colonoscopy and  hysterectomy  . Depression 01/24/2013  . Family history of breast cancer   . Family history of colon cancer   . GERD (gastroesophageal reflux disease)   . Hyperlipidemia   . Hypertension   . IBS (irritable bowel syndrome)   . Palpitations   . Personal history of radiation therapy 2007   left breast  . Personal history of radiation therapy 2019   right breast  . Vertigo     PAST SURGICAL HISTORY: Past Surgical History:  Procedure Laterality Date  . ABDOMINAL HYSTERECTOMY  1985   TAH (ovaries remain)  . BREAST LUMPECTOMY Left 2007  . BREAST LUMPECTOMY Right 2019  . BREAST LUMPECTOMY WITH RADIOACTIVE SEED AND SENTINEL LYMPH NODE BIOPSY Right 07/22/2017   Procedure: BREAST LUMPECTOMY WITH RADIOACTIVE SEED AND SENTINEL LYMPH NODE BIOPSY;  Surgeon: Erroll Luna, MD;  Location: [facility];  Service: General;  Laterality: Right;  . COLONOSCOPY    . TONSILLECTOMY     age 2    FAMILY HISTORY Family History  Problem Relation Age of Onset  . Lung cancer Father        Died at 20 from lung cancer  . Heart disease Father        heart attack  . Colon cancer Mother 31  . Hypertension Mother   . Kidney disease Mother   . Diabetes Mother   . Hypertension Sister   . Diabetes Sister   . Colon polyps Sister   . Breast cancer Maternal Aunt        dx in her 19s  . Coronary artery disease Brother 62       died of "massive heart attack"  . Diabetes Brother   . Colon cancer Cousin        maternal 1st cousin dx over 38  . Ovarian cancer Paternal Grandmother   . Breast cancer Cousin        mat 1st cousin dx in her 75s  . Esophageal cancer Neg Hx   . Stomach cancer Neg Hx   . Rectal cancer Neg Hx   The patient's father had a history of tongue cancer which apparently spread to his lungs according to the patient. He passed due to a MI at age 85. The patient's mother died at age 10 due to renal failure and diabetes.  She also had a history of colon cancer. The patient had 1  brother who died from an MI; and 1 sister.  There was a maternal aunt diagnosed with breast cancer in the 33's. A maternal cousin passed due to breast cancer. Another maternal cousin had colon cancer. She denies a family history of ovarian cancer.    GYNECOLOGIC HISTORY:  Patient's last menstrual period was 03/17/1983. Menarche: 69 years old. She is GXP0. Her LMP was age 28 after her hysterectomy without BSO. She took HRT until 2008, discontinued at the time of her initial breast cancer diagnosis.    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 Her husband, Marjory Lies, worked in [employer] before retiring. He passed away January 28, 2018 from a hemorraghic stroke. He had 2 children from a prior marriage, a daughter Zigmund Daniel, who lives in [place] and works for  [employer], and a son, Feliberto Gottron, who works for the [employer] in [place], [place]. The patient has 3 step-grandchildren. She now lives alone. She attends [employer].     ADVANCED DIRECTIVES:    HEALTH MAINTENANCE: Social History   Tobacco Use  . Smoking status: Never Smoker  . Smokeless tobacco: Never Used  Substance Use Topics  . Alcohol use: No  . Drug use: No     Colonoscopy: UTD/Dr. Perry/ normal  PAP: s/p TAHBSO  Bone density: 02/2019, +1.9   Allergies  Allergen Reactions  . Crab [Shellfish Allergy] Swelling    Blisters on tongue.  . Sulfa Antibiotics Other (See Comments)    headache  . Lipitor [Atorvastatin]     Muscle cramping  . Sulfa Drugs Cross Reactors     Current Outpatient Medications  Medication Sig Dispense Refill  . anastrozole (ARIMIDEX) 1 MG tablet Take 1 tablet (1 mg total) by mouth daily. 90 tablet 4  . Cholecalciferol (VITAMIN D3) 1000 units CAPS Take 1 capsule by mouth daily.    . cloNIDine (CATAPRES) 0.1 MG tablet Take 0.1 mg by mouth at bedtime.    . DULoxetine (CYMBALTA) 60 MG capsule Take 60 mg  by mouth daily.     . Multiple Vitamins-Minerals (ONE-A-DAY WOMENS 50 PLUS PO) Take 1 tablet by mouth daily.    . pantoprazole (PROTONIX) 40 MG tablet Take 1 tablet (40 mg total) by mouth daily. 90 tablet 3  . phentermine 15 MG capsule Take 15 mg by mouth daily.    . rosuvastatin (CRESTOR) 20 MG tablet Take 20 mg by mouth daily.    Marland Kitchen triamterene-hydrochlorothiazide (DYAZIDE) 37.5-25 MG capsule Take by mouth.    Avis Epley SR 150 MG 12 hr tablet Take 150 mg by mouth daily.     No current facility-administered medications for this visit.    OBJECTIVE: Middle-aged African-American woman who appears stated age  76:   08/07/19 0902  BP: 124/63  Pulse: 76  Resp: 17  Temp: 98.3 F (36.8 C)  SpO2: 98%     Body mass index is 37.75 kg/m.   Wt Readings from Last 3 Encounters:  08/07/19 206 lb 6.4 oz (93.6 kg)  07/06/19 212 lb (96.2 kg)  05/26/19 215 lb 12.8 oz (97.9 kg)      ECOG FS:1 - Symptomatic but completely ambulatory  Sclerae unicteric, EOMs intact Wearing a mask No cervical or supraclavicular adenopathy Lungs no rales or rhonchi Heart regular rate and rhythm Abd soft, nontender, positive bowel sounds MSK no focal spinal tenderness, no upper extremity lymphedema Neuro: nonfocal, well oriented, appropriate affect Breasts: The right breast is status post lumpectomy and radiation.  There is still a little bit of hyperpigmentation and some skin coarsening but no evidence of local disease.  Left breast is benign.  Both axillae are benign.   LAB RESULTS:  CMP     Component Value Date/Time   NA 142 08/07/2019 0850   K 3.6 08/07/2019 0850   CL 107 08/07/2019 0850   CO2 26 08/07/2019 0850   GLUCOSE 113 (H) 08/07/2019 0850   BUN 15 08/07/2019 0850   CREATININE 0.98 08/07/2019 0850   CREATININE 0.94 08/23/2018 1006   CREATININE 0.94 04/13/2013 1411   CALCIUM 9.5 08/07/2019 0850   PROT 7.1 08/07/2019 0850   ALBUMIN 4.1 08/07/2019 0850   AST 20 08/07/2019 0850   AST  16 08/23/2018 1006   ALT 23 08/07/2019 0850   ALT 16 08/23/2018 1006  ALKPHOS 74 08/07/2019 0850   BILITOT 0.7 08/07/2019 0850   BILITOT 0.7 08/23/2018 1006   GFRNONAA 60 (L) 08/07/2019 0850   GFRNONAA >60 08/23/2018 1006   GFRNONAA 68 01/24/2013 0942   GFRAA >60 08/07/2019 0850   GFRAA >60 08/23/2018 1006   GFRAA 79 01/24/2013 0942    No results found for: TOTALPROTELP, ALBUMINELP, A1GS, A2GS, BETS, BETA2SER, GAMS, MSPIKE, SPEI  No results found for: KPAFRELGTCHN, LAMBDASER, [facility]  Lab Results  Component Value Date   WBC 4.8 08/07/2019   NEUTROABS 2.0 08/07/2019   HGB 14.2 08/07/2019   HCT 43.4 08/07/2019   MCV 91.6 08/07/2019   PLT 275 08/07/2019    Lab Results  Component Value Date   LABCA2 30 07/28/2010    No components found for: AOZHYQ657  No results for input(s): INR in the last 168 hours.  Lab Results  Component Value Date   LABCA2 30 07/28/2010    No results found for: CAN199  Lab Results  Component Value Date   CAN125 9.5 01/05/2019    No results found for: QIO962  No results found for: CA2729  No components found for: HGQUANT  No results found for: CEA1 / No results found for: CEA1   No results found for: AFPTUMOR  No results found for: CHROMOGRNA  No results found for: HGBA, HGBA2QUANT, HGBFQUANT, HGBSQUAN (Hemoglobinopathy evaluation)   No results found for: LDH  No results found for: IRON, TIBC, IRONPCTSAT (Iron and TIBC)  No results found for: FERRITIN  Urinalysis    Component Value Date/Time   COLORURINE YELLOW 10/22/2018 2130   APPEARANCEUR CLEAR 10/22/2018 2130   LABSPEC <1.005 (L) 10/22/2018 2130   PHURINE 6.5 10/22/2018 2130   GLUCOSEU NEGATIVE 10/22/2018 2130   HGBUR NEGATIVE 10/22/2018 2130   BILIRUBINUR NEGATIVE 10/22/2018 2130   BILIRUBINUR n 04/13/2013 1318   [place] NEGATIVE 10/22/2018 2130   PROTEINUR NEGATIVE 10/22/2018 2130   UROBILINOGEN negative 04/13/2013 1318   NITRITE NEGATIVE 10/22/2018  2130   LEUKOCYTESUR NEGATIVE 10/22/2018 2130     STUDIES: No results found.   ELIGIBLE FOR AVAILABLE RESEARCH PROTOCOL: no  ASSESSMENT: 69 y.o. [place] , [place] woman status post left lumpectomy February 2008 for a grade 3 ductal carcinoma in situ which was strongly estrogen and progesterone receptor positive.  (a)   adjuvant radiation completed May 2008  (b)  on tamoxifen July 2008 through March 2013.  (1) status post right breast upper outer quadrant biopsy 06/22/2017 for a clinnical T1b N0, stage IA invasive ductal carcinoma, with extracellular mucin; grade 2; estrogen and progesterone receptor positive, HER-2 not amplified, with an MIB-1 of 20%.  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 The following genes were evaluated for sequence changes only: HOXB13*, NTHL1*, SDHA   (c) patients with ATM mutations are at increased risk of breast cancer potential increase in ovarian cancer and pancreatic cancer risk  (3) Oncotype obtained from the 06/22/2017 biopsy showed a score of 25, predicting a risk of recurrence outside the breast over the next 9 years of 12%  if the patient took antiestrogens for 5 years.  It also shows no benefit from chemotherapy.  (4) status post right lumpectomy and sentinel lymph node sampling 07/22/2017 for a pT1b pN0, stage Ia invasive ductal carcinoma, grade 2, with extracellular mucin, and negative margins.  (a) a total of 3 lymph nodes were removed  (5) adjuvant  radiation 08/31/2017 to 10/19/2017 Site/dose:    1. The Right breast was treated to 50.4 Gy in 28 fractions of 1.8 Gy. 2. The Right breast was boosted to 10 Gy in 5 fractions of 2 Gy.  (6) started anastrozole 11/14/2017  (a) bone density December 2020  (7) intensified screening:   (a) breast MRI December 2020, repeat October 2021  (b) mammography 06/26/2019, repeat April 2022 (8) ovarian cancer screening: per Dr Sabra Heck  PLAN: Katelyn Porter is now 2 years out from definitive surgery for her breast cancer with no evidence of disease recurrence.  This is very favorable.  The plan is to continue anastrozole a minimum of 5 years.  She is still having some hot flashes although they are slightly better.  For now the plan is to just "live with it"--she does not want to add any further medications to her list at present  I commended her exercise program and her attempt to bring her weight down under control  I think she may have carpal tunnel on the right.  She is going to use a wrist splint and see if that relieves the symptoms.  Otherwise she will have a repeat breast MRI in October and see me in November.  She knows to call for any other issue that may develop before then.  Total encounter time 30 minutes.*  Ry Moody, Virgie Dad, MD  08/07/19 9:39 AM Medical Oncology and Hematology [facility] [place], [place] [address] Tel. [phone]    Fax. [phone]   I, Wilburn Mylar, am acting as scribe for Dr. Virgie Dad. Brexton Sofia.  I, Lurline Del MD, have reviewed the above documentation for accuracy and completeness, and I agree with the above.   *Total Encounter Time as defined by the Centers for Medicare and Medicaid Services includes, in addition to the face-to-face time of a patient visit (documented in the note above) non-face-to-face time: obtaining and reviewing outside history, ordering and reviewing medications, tests or procedures, care coordination  (communications with other health care professionals or caregivers) and documentation in the medical record.

## 2019-08-07 ENCOUNTER — Inpatient Hospital Stay: Payer: Medicare HMO

## 2019-08-07 ENCOUNTER — Inpatient Hospital Stay: Payer: Medicare HMO | Attending: Oncology | Admitting: Oncology

## 2019-08-07 ENCOUNTER — Other Ambulatory Visit: Payer: Self-pay

## 2019-08-07 VITALS — BP 124/63 | HR 76 | Temp 98.3°F | Resp 17 | Wt 206.4 lb

## 2019-08-07 DIAGNOSIS — C50411 Malignant neoplasm of upper-outer quadrant of right female breast: Secondary | ICD-10-CM | POA: Insufficient documentation

## 2019-08-07 DIAGNOSIS — C50912 Malignant neoplasm of unspecified site of left female breast: Secondary | ICD-10-CM

## 2019-08-07 DIAGNOSIS — Z170 Estrogen receptor positive status [ER+]: Secondary | ICD-10-CM | POA: Diagnosis not present

## 2019-08-07 DIAGNOSIS — Z1501 Genetic susceptibility to malignant neoplasm of breast: Secondary | ICD-10-CM

## 2019-08-07 DIAGNOSIS — Z79810 Long term (current) use of selective estrogen receptor modulators (SERMs): Secondary | ICD-10-CM | POA: Diagnosis not present

## 2019-08-07 DIAGNOSIS — Z1589 Genetic susceptibility to other disease: Secondary | ICD-10-CM | POA: Diagnosis not present

## 2019-08-07 DIAGNOSIS — H9319 Tinnitus, unspecified ear: Secondary | ICD-10-CM | POA: Insufficient documentation

## 2019-08-07 DIAGNOSIS — C50911 Malignant neoplasm of unspecified site of right female breast: Secondary | ICD-10-CM

## 2019-08-07 DIAGNOSIS — Z1509 Genetic susceptibility to other malignant neoplasm: Secondary | ICD-10-CM

## 2019-08-07 HISTORY — DX: Tinnitus, unspecified ear: H93.19

## 2019-08-07 LAB — CBC WITH DIFFERENTIAL/PLATELET
Abs Immature Granulocytes: 0 10*3/uL (ref 0.00–0.07)
Basophils Absolute: 0 10*3/uL (ref 0.0–0.1)
Basophils Relative: 1 %
Eosinophils Absolute: 0.2 10*3/uL (ref 0.0–0.5)
Eosinophils Relative: 4 %
HCT: 43.4 % (ref 36.0–46.0)
Hemoglobin: 14.2 g/dL (ref 12.0–15.0)
Immature Granulocytes: 0 %
Lymphocytes Relative: 43 %
Lymphs Abs: 2.1 10*3/uL (ref 0.7–4.0)
MCH: 30 pg (ref 26.0–34.0)
MCHC: 32.7 g/dL (ref 30.0–36.0)
MCV: 91.6 fL (ref 80.0–100.0)
Monocytes Absolute: 0.6 10*3/uL (ref 0.1–1.0)
Monocytes Relative: 11 %
Neutro Abs: 2 10*3/uL (ref 1.7–7.7)
Neutrophils Relative %: 41 %
Platelets: 275 10*3/uL (ref 150–400)
RBC: 4.74 MIL/uL (ref 3.87–5.11)
RDW: 12.2 % (ref 11.5–15.5)
WBC: 4.8 10*3/uL (ref 4.0–10.5)
nRBC: 0 % (ref 0.0–0.2)

## 2019-08-07 LAB — COMPREHENSIVE METABOLIC PANEL
ALT: 23 U/L (ref 0–44)
AST: 20 U/L (ref 15–41)
Albumin: 4.1 g/dL (ref 3.5–5.0)
Alkaline Phosphatase: 74 U/L (ref 38–126)
Anion gap: 9 (ref 5–15)
BUN: 15 mg/dL (ref 8–23)
CO2: 26 mmol/L (ref 22–32)
Calcium: 9.5 mg/dL (ref 8.9–10.3)
Chloride: 107 mmol/L (ref 98–111)
Creatinine, Ser: 0.98 mg/dL (ref 0.44–1.00)
GFR calc Af Amer: >60 mL/min (ref >60)
GFR calc non Af Amer: 60 mL/min — ABNORMAL LOW (ref >60)
Glucose, Bld: 113 mg/dL — ABNORMAL HIGH (ref 70–99)
Potassium: 3.6 mmol/L (ref 3.5–5.1)
Sodium: 142 mmol/L (ref 135–145)
Total Bilirubin: 0.7 mg/dL (ref 0.3–1.2)
Total Protein: 7.1 g/dL (ref 6.5–8.1)

## 2019-08-07 MED ORDER — ANASTROZOLE 1 MG PO TABS: 1.0000 mg | ORAL_TABLET | Freq: Every day | ORAL | 4 refills | Status: DC

## 2019-08-07 NOTE — Addendum Note (Signed)
Addended by: Chauncey Cruel on: 08/07/2019 09:53 AM   Modules accepted: Orders

## 2019-08-08 ENCOUNTER — Telehealth: Payer: Self-pay | Admitting: Oncology

## 2019-08-08 NOTE — Telephone Encounter (Signed)
Scheduled appts per 5/24 los. Pt confirmed appt date and time.

## 2019-10-09 ENCOUNTER — Telehealth: Payer: Self-pay | Admitting: Internal Medicine

## 2019-10-09 NOTE — Telephone Encounter (Signed)
Patient called states she is having a lot of abdominal pain please advise

## 2019-10-09 NOTE — Telephone Encounter (Signed)
Spoke with patient and she reports she started having abdominal bloating/pain last Wednesday/Thursday. States she took a stool softener and has been having 2 bowel movements/day. Bloating improved but still has soreness with a deep breath and if she moves fast. Denies fever, nausea, vomiting. She is taking Pantoprazole. Hx of diverticulosis, diverticulitis in past. Scheduled with Dr. Henrene Pastor on 10/11/19 at 10:40. She understands to go to ER if she worsens prior to OV.

## 2019-10-11 ENCOUNTER — Other Ambulatory Visit (INDEPENDENT_AMBULATORY_CARE_PROVIDER_SITE_OTHER): Payer: Medicare HMO

## 2019-10-11 ENCOUNTER — Ambulatory Visit: Payer: Medicare HMO | Admitting: Internal Medicine

## 2019-10-11 ENCOUNTER — Encounter: Payer: Self-pay | Admitting: Internal Medicine

## 2019-10-11 VITALS — BP 130/56 | HR 80 | Ht 62.0 in | Wt 207.1 lb

## 2019-10-11 DIAGNOSIS — R1013 Epigastric pain: Secondary | ICD-10-CM

## 2019-10-11 DIAGNOSIS — K2100 Gastro-esophageal reflux disease with esophagitis, without bleeding: Secondary | ICD-10-CM

## 2019-10-11 DIAGNOSIS — R140 Abdominal distension (gaseous): Secondary | ICD-10-CM | POA: Diagnosis not present

## 2019-10-11 DIAGNOSIS — R6881 Early satiety: Secondary | ICD-10-CM

## 2019-10-11 LAB — CBC WITH DIFFERENTIAL/PLATELET
Basophils Absolute: 0.1 10*3/uL (ref 0.0–0.1)
Basophils Relative: 1.3 % (ref 0.0–3.0)
Eosinophils Absolute: 0.1 10*3/uL (ref 0.0–0.7)
Eosinophils Relative: 2.4 % (ref 0.0–5.0)
HCT: 41.6 % (ref 36.0–46.0)
Hemoglobin: 13.8 g/dL (ref 12.0–15.0)
Lymphocytes Relative: 34.3 % (ref 12.0–46.0)
Lymphs Abs: 1.7 10*3/uL (ref 0.7–4.0)
MCHC: 33.2 g/dL (ref 30.0–36.0)
MCV: 91.9 fl (ref 78.0–100.0)
Monocytes Absolute: 0.5 10*3/uL (ref 0.1–1.0)
Monocytes Relative: 9.5 % (ref 3.0–12.0)
Neutro Abs: 2.7 10*3/uL (ref 1.4–7.7)
Neutrophils Relative %: 52.5 % (ref 43.0–77.0)
Platelets: 220 10*3/uL (ref 150.0–400.0)
RBC: 4.53 Mil/uL (ref 3.87–5.11)
RDW: 13.2 % (ref 11.5–15.5)
WBC: 5.1 10*3/uL (ref 4.0–10.5)

## 2019-10-11 LAB — LIPASE: Lipase: 34 U/L (ref 11.0–59.0)

## 2019-10-11 LAB — HEPATIC FUNCTION PANEL
ALT: 23 U/L (ref 0–35)
AST: 23 U/L (ref 0–37)
Albumin: 4.2 g/dL (ref 3.5–5.2)
Alkaline Phosphatase: 73 U/L (ref 39–117)
Bilirubin, Direct: 0.1 mg/dL (ref 0.0–0.3)
Total Bilirubin: 0.6 mg/dL (ref 0.2–1.2)
Total Protein: 6.8 g/dL (ref 6.0–8.3)

## 2019-10-11 NOTE — Patient Instructions (Signed)
Your provider has requested that you go to the basement level for lab work before leaving today. Press "B" on the elevator. The lab is located at the first door on the left as you exit the elevator.  You have been scheduled for an endoscopy. Please follow written instructions given to you at your visit today. If you use inhalers (even only as needed), please bring them with you on the day of your procedure.   You have been scheduled for an abdominal ultrasound at [facility] (1st floor of hospital) on 10/17/2019 at 9:00am. Please arrive 15 minutes prior to your appointment for registration. Make certain not to have anything to eat or drink after midnight prior to your appointment. Should you need to reschedule your appointment, please contact radiology at [phone]. This test typically takes about 30 minutes to perform.

## 2019-10-11 NOTE — Progress Notes (Signed)
HISTORY OF PRESENT ILLNESS:  Katelyn Porter is a 69 y.o. female with multiple medical problems as listed below.  GI diagnoses include GERD, IBS, chronic bloating, and multiple adenomatous colon polyps.  She was last seen in this office by myself May 2020 for abdominal bloating.  Treated with metronidazole with short effect.  Subsequently seen by the physician assistant in March 2021.  Again for bloating.  Treated with Xifaxan.  She presents now with a new complaint of upper abdominal pain in the epigastric region.  This has been of 1 weeks duration.  She states that is new.  Somewhat exacerbated by positional movement and deep breathing.  No nausea or vomiting.  She still has some issues with bloating.  Bowel habits unchanged from baseline.  No bleeding.  No fevers.  No weight loss.  Her last complete colonoscopy was performed June 2019.  Polyps and diverticulosis.  Follow-up in 3 years recommended.  CT of the abdomen and pelvis raised the question of sigmoid colon thickening.  Last upper endoscopy February 2016 without significant abnormality.  She does take daily PPI for GERD.  No classic GERD symptoms.  REVIEW OF SYSTEMS:  All non-GI ROS negative unless otherwise stated in the HPI except for dizziness  Past Medical History:  Diagnosis Date  . Breast cancer ([place]) 2008   left, with radiation finished 08, tamoxifen  . Breast cancer ([place]) 2019   Rt. had lymph node removed  having radiation currently  . Complication of anesthesia    woke up with colonoscopy and hysterectomy  . Depression 01/24/2013  . Family history of breast cancer   . Family history of colon cancer   . GERD (gastroesophageal reflux disease)   . Hyperlipidemia   . Hypertension   . IBS (irritable bowel syndrome)   . Palpitations   . Personal history of radiation therapy 2007   left breast  . Personal history of radiation therapy 2019   right breast  . Vertigo     Past Surgical History:  Procedure Laterality Date  .  ABDOMINAL HYSTERECTOMY  1985   TAH (ovaries remain)  . BREAST LUMPECTOMY Left 2007  . BREAST LUMPECTOMY Right 2019  . BREAST LUMPECTOMY WITH RADIOACTIVE SEED AND SENTINEL LYMPH NODE BIOPSY Right 07/22/2017   Procedure: BREAST LUMPECTOMY WITH RADIOACTIVE SEED AND SENTINEL LYMPH NODE BIOPSY;  Surgeon: Erroll Luna, MD;  Location: [facility];  Service: General;  Laterality: Right;  . COLONOSCOPY    . TONSILLECTOMY     age 89    Social History Katelyn Porter  reports that she has never smoked. She has never used smokeless tobacco. She reports that she does not drink alcohol and does not use drugs.  family history includes Breast cancer in her cousin and maternal aunt; Colon cancer in her cousin; Colon cancer (age of onset: 94) in her mother; Colon polyps in her sister; Coronary artery disease (age of onset: 53) in her brother; Diabetes in her brother, mother, and sister; Heart disease in her father; Hypertension in her mother and sister; Kidney disease in her mother; Lung cancer in her father; Ovarian cancer in her paternal grandmother.  Allergies  Allergen Reactions  . Crab [Shellfish Allergy] Swelling    Blisters on tongue.  . Sulfa Antibiotics Other (See Comments)    headache  . Lipitor [Atorvastatin]     Muscle cramping  . Sulfa Drugs Cross Reactors        PHYSICAL EXAMINATION: Vital signs: BP (!) 130/56 (BP  Location: Right Arm, Patient Position: Sitting, Cuff Size: Normal)   Pulse 80   Ht 5\' 2"  (1.575 m) Comment: height measured without shoes  Wt (!) 207 lb 2 oz (94 kg)   LMP 03/17/1983   BMI 37.88 kg/m   Constitutional: generally well-appearing, no acute distress Psychiatric: alert and oriented x3, cooperative Eyes: extraocular movements intact, anicteric, conjunctiva pink Mouth: oral pharynx moist, no lesions Neck: supple no lymphadenopathy Cardiovascular: heart regular rate and rhythm, no murmur Lungs: clear to auscultation bilaterally Abdomen: soft,  mild tenderness in the epigastric region, nondistended, no obvious ascites, no peritoneal signs, normal bowel sounds, no organomegaly Rectal: Omitted Extremities: no clubbing, cyanosis, or lower extremity edema bilaterally Skin: no lesions on visible extremities Neuro: No focal deficits.  Cranial nerves intact  ASSESSMENT:  1.  1 week history of upper abdominal pain as described.  Rule out breakthrough GERD, pancreaticobiliary disorders, ulcer. 2.  Chronic bloating.  Variable severity over time 3.  GERD.  No classic symptoms on PPI 4.  History of multiple polyps.  Surveillance up-to-date   PLAN:  1.  Reflux precautions.  Reviewed 2.  Continue PPI 3.  Laboratories today including liver test, CBC, and lipase 4.  Schedule abdominal ultrasound to rule out gallstones 5.  Schedule upper endoscopy to evaluate upper abdominal pain.The nature of the procedure, as well as the risks, benefits, and alternatives were carefully and thoroughly reviewed with the patient. Ample time for discussion and questions allowed. The patient understood, was satisfied, and agreed to proceed. Total time of 40 minutes was spent preparing to see the patient, reviewing x-rays and laboratories, obtaining comprehensive history and performing comprehensive physical exam.  As well counseling the patient regarding her above conditions and complaints as well as reviewing her work-up.  Scheduling lab work, advanced radiology, and endoscopic procedures.  Finally documenting clinical information in the health record

## 2019-10-13 ENCOUNTER — Ambulatory Visit (AMBULATORY_SURGERY_CENTER): Payer: Medicare HMO | Admitting: Internal Medicine

## 2019-10-13 ENCOUNTER — Encounter: Payer: Self-pay | Admitting: Internal Medicine

## 2019-10-13 ENCOUNTER — Other Ambulatory Visit: Payer: Self-pay

## 2019-10-13 VITALS — BP 146/68 | HR 70 | Temp 96.6°F | Resp 14 | Ht 62.0 in | Wt 207.0 lb

## 2019-10-13 DIAGNOSIS — K449 Diaphragmatic hernia without obstruction or gangrene: Secondary | ICD-10-CM

## 2019-10-13 DIAGNOSIS — R1013 Epigastric pain: Secondary | ICD-10-CM | POA: Diagnosis present

## 2019-10-13 DIAGNOSIS — K317 Polyp of stomach and duodenum: Secondary | ICD-10-CM

## 2019-10-13 MED ORDER — SODIUM CHLORIDE 0.9 % IV SOLN: 500.0000 mL | Freq: Once | INTRAVENOUS | Status: DC

## 2019-10-13 NOTE — Patient Instructions (Addendum)
Handout was given to you on a Hiatal Hernia. You may resume your current medications today. Per Dr. Henrene Pastor please keep plans for an abdominal ultrasound. We will contact you with the results when available. Please call if any questions or concerns.   YOU HAD AN ENDOSCOPIC PROCEDURE TODAY AT [facility]:   Refer to the procedure report that was given to you for any specific questions about what was found during the examination.  If the procedure report does not answer your questions, please call your gastroenterologist to clarify.  If you requested that your care partner not be given the details of your procedure findings, then the procedure report has been included in a sealed envelope for you to review at your convenience later.  YOU SHOULD EXPECT: Some feelings of bloating in the abdomen. Passage of more gas than usual.  Walking can help get rid of the air that was put into your GI tract during the procedure and reduce the bloating. If you had a lower endoscopy (such as a colonoscopy or flexible sigmoidoscopy) you may notice spotting of blood in your stool or on the toilet paper. If you underwent a bowel prep for your procedure, you may not have a normal bowel movement for a few days.  Please Note:  You might notice some irritation and congestion in your nose or some drainage.  This is from the oxygen used during your procedure.  There is no need for concern and it should clear up in a day or so.  SYMPTOMS TO REPORT IMMEDIATELY:     Following upper endoscopy (EGD)  Vomiting of blood or coffee ground material  New chest pain or pain under the shoulder blades  Painful or persistently difficult swallowing  New shortness of breath  Fever of 100F or higher  Black, tarry-looking stools  For urgent or emergent issues, a gastroenterologist can be reached at any hour by calling [phone]. Do not use MyChart messaging for urgent concerns.    DIET:  We do recommend a small  meal at first, but then you may proceed to your regular diet.  Drink plenty of fluids but you should avoid alcoholic beverages for 24 hours.  ACTIVITY:  You should plan to take it easy for the rest of today and you should NOT DRIVE or use heavy machinery until tomorrow (because of the sedation medicines used during the test).    FOLLOW UP: Our staff will call the number listed on your records 48-72 hours following your procedure to check on you and address any questions or concerns that you may have regarding the information given to you following your procedure. If we do not reach you, we will leave a message.  We will attempt to reach you two times.  During this call, we will ask if you have developed any symptoms of COVID 19. If you develop any symptoms (ie: fever, flu-like symptoms, shortness of breath, cough etc.) before then, please call [phone].  If you test positive for Covid 19 in the 2 weeks post procedure, please call and report this information to [place].    If any biopsies were taken you will be contacted by phone or by letter within the next 1-3 weeks.  Please call us at [phone] if you have not heard about the biopsies in 3 weeks.    SIGNATURES/CONFIDENTIALITY: You and/or your care partner have signed paperwork which will be entered into your electronic medical record.  These signatures attest to the fact  that that the information above on your After Visit Summary has been reviewed and is understood.  Full responsibility of the confidentiality of this discharge information lies with you and/or your care-partner.

## 2019-10-13 NOTE — Progress Notes (Signed)
Pt's states no medical or surgical changes since previsit or office visit. 

## 2019-10-13 NOTE — Progress Notes (Signed)
pt tolerated well. VSS. awake and to recovery. Report given to RN. Bite block inserted and removed without trauma. 

## 2019-10-13 NOTE — Op Note (Signed)
[facility] Patient Name: Katelyn Porter Procedure Date: 10/13/2019 2:55 PM MRN: [id number] Endoscopist: Docia Chuck. Henrene Pastor , MD Age: 69 Referring MD:  Date of Birth: 01-16-1951 Gender: Female Account #: [id number] Procedure:                Upper GI endoscopy Indications:              Epigastric abdominal pain Medicines:                Monitored Anesthesia Care Procedure:                Pre-Anesthesia Assessment:                           - Prior to the procedure, a History and Physical                            was performed, and patient medications and                            allergies were reviewed. The patient's tolerance of                            previous anesthesia was also reviewed. The risks                            and benefits of the procedure and the sedation                            options and risks were discussed with the patient.                            All questions were answered, and informed consent                            was obtained. Prior Anticoagulants: The patient has                            taken no previous anticoagulant or antiplatelet                            agents. ASA Grade Assessment: II - A patient with                            mild systemic disease. After reviewing the risks                            and benefits, the patient was deemed in                            satisfactory condition to undergo the procedure.                           After obtaining informed consent, the endoscope was  passed under direct vision. Throughout the                            procedure, the patient's blood pressure, pulse, and                            oxygen saturations were monitored continuously. The                            Endoscope was introduced through the mouth, and                            advanced to the second part of duodenum. The upper                            GI endoscopy was  accomplished without difficulty.                            The patient tolerated the procedure well. Scope In: Scope Out: Findings:                 The esophagus was normal.                           The stomach was normal, save a few diminutive                            benign fundic gland type polyps and a small hiatal                            hernia.                           The examined duodenum was normal.                           The cardia and gastric fundus were normal on                            retroflexion. Complications:            No immediate complications. Estimated Blood Loss:     Estimated blood loss: none. Impression:               - Normal esophagus.                           - Normal stomach save a few benign fundic gland                            type polyps and a small hiatal hernia.                           - Normal examined duodenum.                           - No specimens collected. Recommendation:           -  Patient has a contact number available for                            emergencies. The signs and symptoms of potential                            delayed complications were discussed with the                            patient. Return to normal activities tomorrow.                            Written discharge instructions were provided to the                            patient.                           - Resume previous diet.                           - Continue present medications.                           -Keep plans for abdominal ultrasound scheduled. We                            will contact you with results when available Docia Chuck. Henrene Pastor, MD 10/13/2019 3:20:05 PM This report has been signed electronically.

## 2019-10-13 NOTE — Progress Notes (Signed)
Pt was advised to keep plans for an abdominal ultrasound as scheduled per Dr. Henrene Pastor. Maw  No problems noted in the recovery room. maw

## 2019-10-17 ENCOUNTER — Telehealth: Payer: Self-pay | Admitting: *Deleted

## 2019-10-17 ENCOUNTER — Ambulatory Visit (HOSPITAL_COMMUNITY)
Admission: RE | Admit: 2019-10-17 | Discharge: 2019-10-17 | Disposition: A | Discharge status: Home / Self Care | Payer: Medicare HMO | Source: Ambulatory Visit | Attending: Internal Medicine | Admitting: Internal Medicine

## 2019-10-17 ENCOUNTER — Other Ambulatory Visit: Payer: Self-pay

## 2019-10-17 DIAGNOSIS — R6881 Early satiety: Secondary | ICD-10-CM | POA: Insufficient documentation

## 2019-10-17 DIAGNOSIS — R1013 Epigastric pain: Secondary | ICD-10-CM | POA: Diagnosis present

## 2019-10-17 NOTE — Telephone Encounter (Signed)
  Follow up Call-  Call back number 10/13/2019 09/07/2017  Post procedure Call Back phone  # [phone]  Permission to leave phone message Yes Yes  Some recent data might be hidden     Patient questions:  Do you have a fever, pain , or abdominal swelling? No. Pain Score  0 *  Have you tolerated food without any problems? Yes.    Have you been able to return to your normal activities? Yes.    Do you have any questions about your discharge instructions: Diet   No. Medications  No. Follow up visit  No.  Do you have questions or concerns about your Care? No.  Actions: * If pain score is 4 or above: No action needed, pain <4.   1. Have you developed a fever since your procedure? no  2.   Have you had an respiratory symptoms (SOB or cough) since your procedure? no  3.   Have you tested positive for COVID 19 since your procedure no  4.   Have you had any family members/close contacts diagnosed with the COVID 19 since your procedure?no    If yes to any of these questions please route to Joylene John, RN and Erenest Rasher, RN

## 2019-10-24 ENCOUNTER — Telehealth: Payer: Self-pay | Admitting: Obstetrics & Gynecology

## 2019-10-24 NOTE — Telephone Encounter (Signed)
Spoke with patient regarding benefits for recommended ultrasound. Patient is aware that ultrasound is transvaginal. Patient acknowledges understanding of information presented. Patient is aware of cancellation policy. Patient scheduled appointment for 01/04/2020/ at 1230PM with M. Edwinna Areola, MD. Encounter closed.

## 2019-10-24 NOTE — Telephone Encounter (Signed)
Patient is returning a call to Hayley. °

## 2019-10-24 NOTE — Telephone Encounter (Signed)
Call to patient. Per DPR, OK to leave message on voicemail.  Left voicemail requesting a return call to Hayley to review benefits and schedule recommended Pelvic ultrasound with M. Suzanne Miller, MD 

## 2019-10-27 DIAGNOSIS — R200 Anesthesia of skin: Secondary | ICD-10-CM | POA: Insufficient documentation

## 2019-10-27 DIAGNOSIS — G5622 Lesion of ulnar nerve, left upper limb: Secondary | ICD-10-CM

## 2019-10-27 DIAGNOSIS — G5603 Carpal tunnel syndrome, bilateral upper limbs: Secondary | ICD-10-CM | POA: Insufficient documentation

## 2019-10-27 HISTORY — DX: Lesion of ulnar nerve, left upper limb: G56.22

## 2019-10-27 HISTORY — DX: Carpal tunnel syndrome, bilateral upper limbs: G56.03

## 2019-10-27 HISTORY — DX: Anesthesia of skin: R20.0

## 2019-11-09 ENCOUNTER — Ambulatory Visit: Payer: Medicare HMO | Admitting: Physician Assistant

## 2019-11-24 IMAGING — CR DG CHEST 2V
2 series · 2 of 2 positions shown · non-contrast
Comparison: April 20, 2016

CLINICAL DATA: Hypertension and palpitation

EXAM:
CHEST - 2 VIEW

[w chest lat]
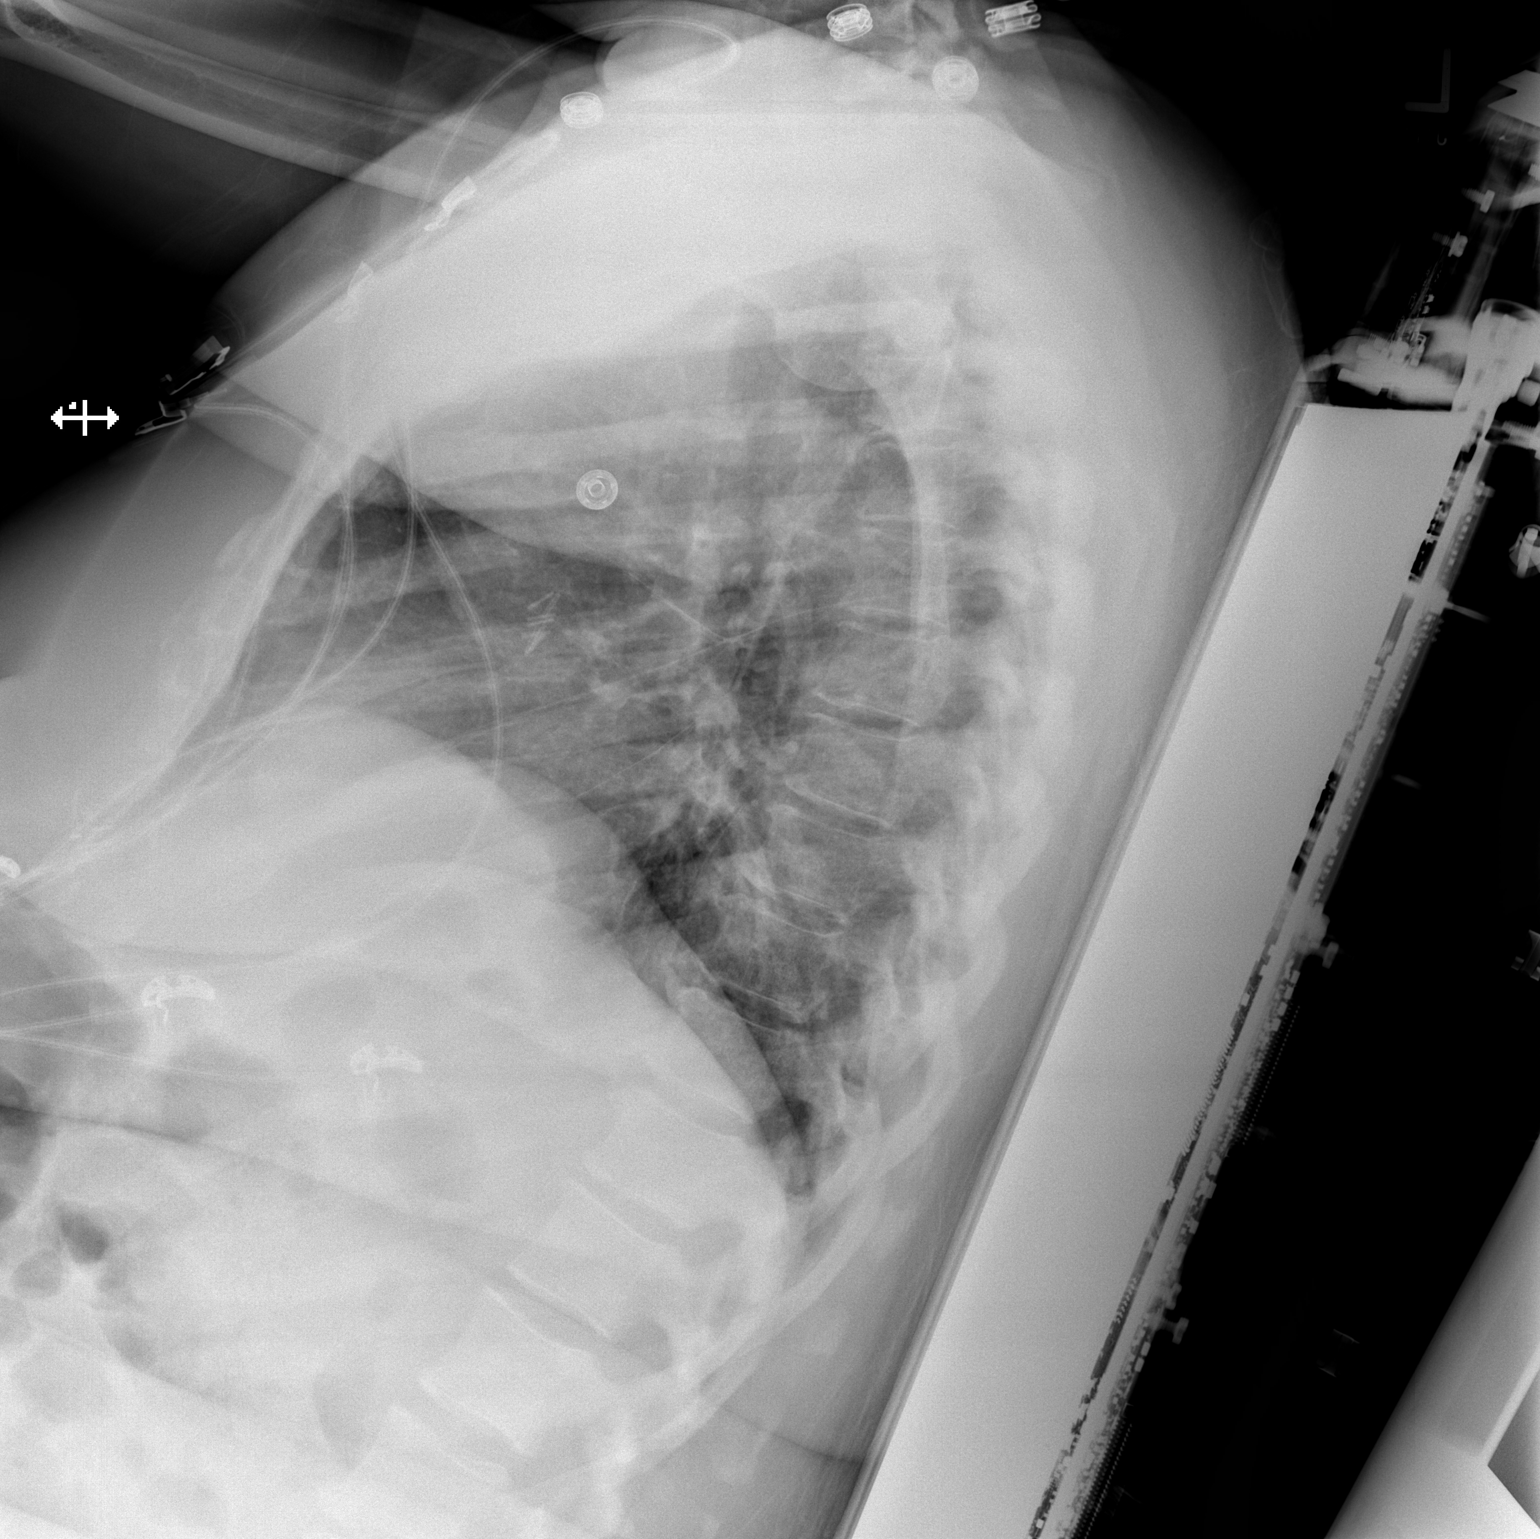

[x chest ap]
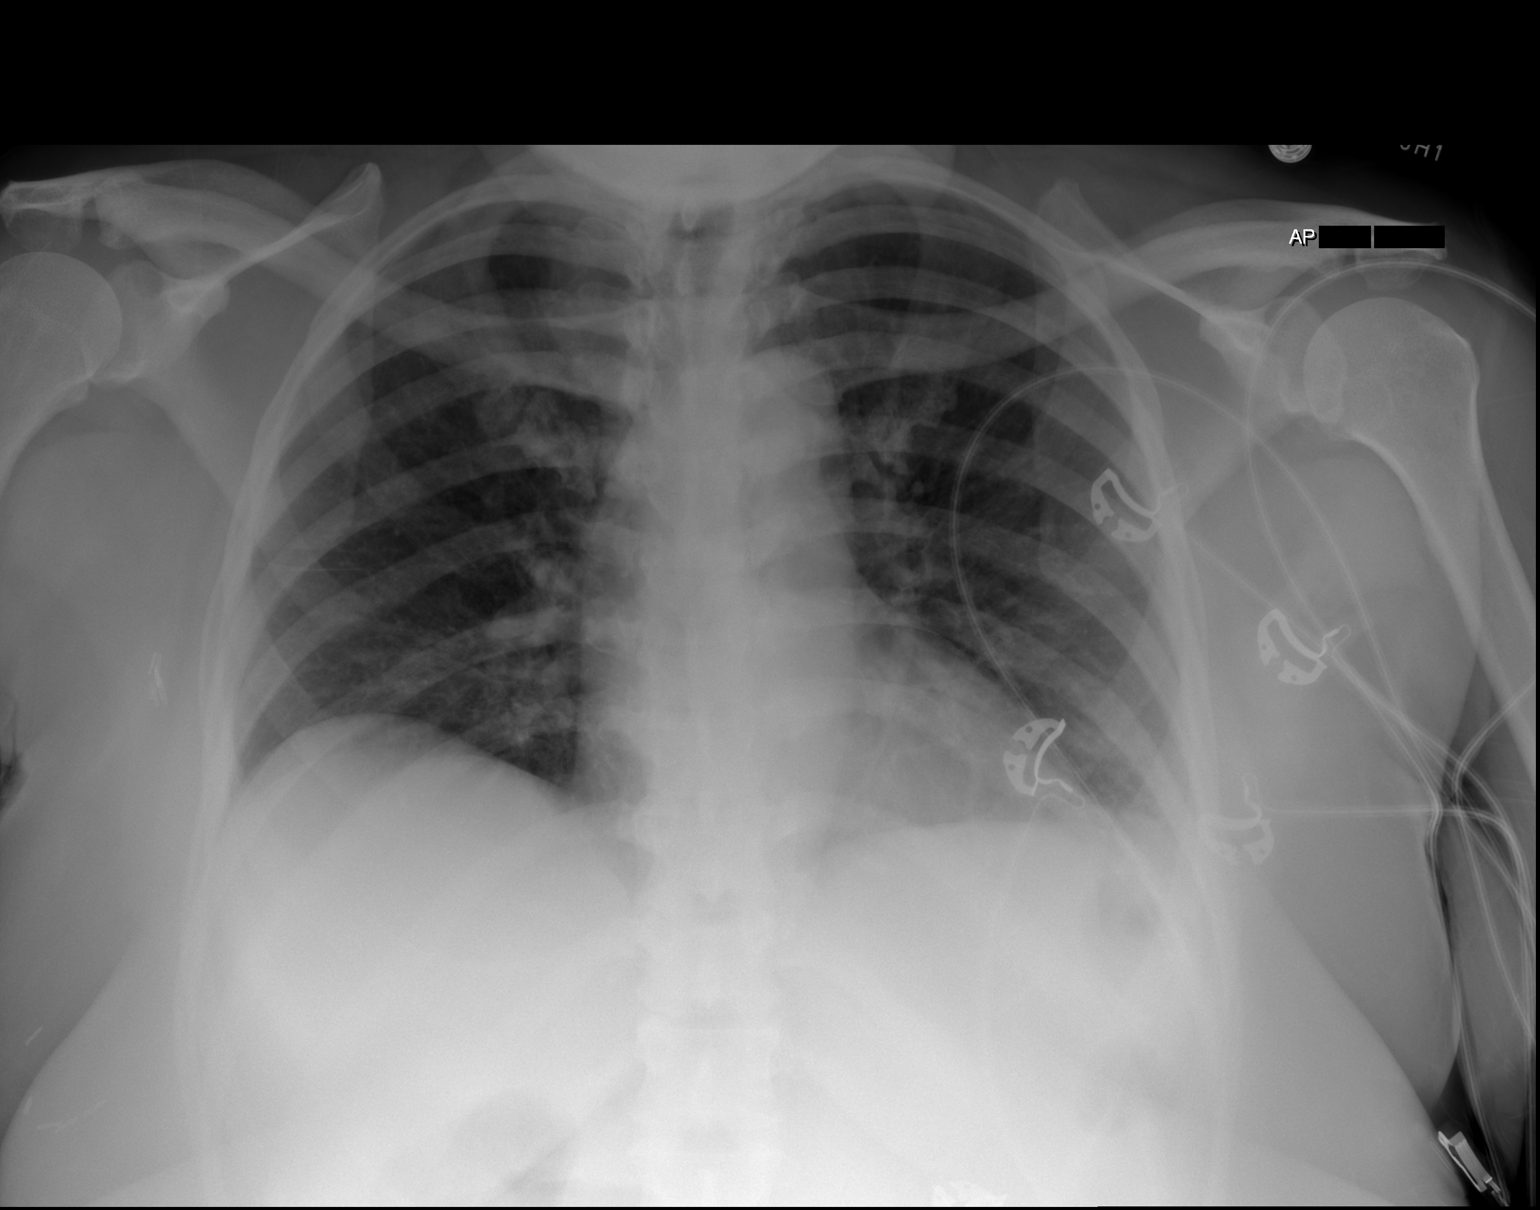

[2 of 2 positions shown; findings below may reference images not displayed]

FINDINGS: The heart size and mediastinal contours are within normal limits.
There is no focal infiltrate, pulmonary edema, or pleural effusion.
The visualized skeletal structures are unremarkable.
IMPRESSION: No active cardiopulmonary disease.

## 2019-12-26 ENCOUNTER — Ambulatory Visit (HOSPITAL_COMMUNITY)
Admission: RE | Admit: 2019-12-26 | Discharge: 2019-12-26 | Disposition: A | Discharge status: Home / Self Care | Payer: Medicare HMO | Source: Ambulatory Visit | Attending: Oncology | Admitting: Oncology

## 2019-12-26 ENCOUNTER — Other Ambulatory Visit: Payer: Self-pay

## 2019-12-26 DIAGNOSIS — H9319 Tinnitus, unspecified ear: Secondary | ICD-10-CM | POA: Diagnosis present

## 2019-12-26 DIAGNOSIS — Z1509 Genetic susceptibility to other malignant neoplasm: Secondary | ICD-10-CM | POA: Insufficient documentation

## 2019-12-26 DIAGNOSIS — C50411 Malignant neoplasm of upper-outer quadrant of right female breast: Secondary | ICD-10-CM | POA: Insufficient documentation

## 2019-12-26 DIAGNOSIS — Z1501 Genetic susceptibility to malignant neoplasm of breast: Secondary | ICD-10-CM | POA: Diagnosis present

## 2019-12-26 DIAGNOSIS — Z1589 Genetic susceptibility to other disease: Secondary | ICD-10-CM | POA: Insufficient documentation

## 2019-12-26 DIAGNOSIS — Z170 Estrogen receptor positive status [ER+]: Secondary | ICD-10-CM | POA: Diagnosis present

## 2019-12-26 MED ORDER — GADOBUTROL 1 MMOL/ML IV SOLN
10.0000 mL | Freq: Once | INTRAVENOUS | Status: AC | PRN
  Administered 2019-12-26: 10 mL via INTRAVENOUS

## 2020-01-04 ENCOUNTER — Ambulatory Visit (INDEPENDENT_AMBULATORY_CARE_PROVIDER_SITE_OTHER): Payer: Medicare HMO | Admitting: Obstetrics & Gynecology

## 2020-01-04 ENCOUNTER — Encounter: Payer: Self-pay | Admitting: Obstetrics & Gynecology

## 2020-01-04 ENCOUNTER — Ambulatory Visit (INDEPENDENT_AMBULATORY_CARE_PROVIDER_SITE_OTHER): Payer: Medicare HMO

## 2020-01-04 ENCOUNTER — Other Ambulatory Visit: Payer: Self-pay

## 2020-01-04 VITALS — BP 122/70 | HR 70 | Resp 16 | Wt 212.0 lb

## 2020-01-04 DIAGNOSIS — N83202 Unspecified ovarian cyst, left side: Secondary | ICD-10-CM

## 2020-01-04 DIAGNOSIS — D4959 Neoplasm of unspecified behavior of other genitourinary organ: Secondary | ICD-10-CM

## 2020-01-04 NOTE — Progress Notes (Signed)
69 y.o. G1P0 Widowed Black or [place] American female here for pelvic ultrasound due to left ovarian cyst.  This is being followed for two years.  Last ultrasound was 06/2018 with left cyst measuring 3.3cm.  Denies pelvic pain.    Patient's last menstrual period was 03/17/1983.  Contraception: PMP, hysterectomy  Findings:  UTERUS: surgically absent EMS: n/a ADNEXA: Left ovary: 4.5 x 3.8 x 2.9cm with simple cyst measuring 3.4 x 2.9 x 3.2cm.  This is stable in size from ultrasound 06/2019.         Right ovary: 1.9 x 1.4 x 1.2cm CUL DE SAC: no free fluid  Discussion:  Findings reviewed with pt.  Ultrasonographer supervised.  Will obtain ca-125 today.  Plan to repeat ultrasound in 6 months.   Assessment:  Simple left ovarian cyst  Plan:  Will obtained ca-125 today Return for PUS in 6 months. Pt has AEX scheduled 03/2019  15 minutes of total time was spent for this patient encounter, including preparation, face-to-face counseling with the patient and coordination of care, and documentation of the encounter.

## 2020-01-08 ENCOUNTER — Telehealth: Payer: Self-pay

## 2020-01-08 DIAGNOSIS — D4959 Neoplasm of unspecified behavior of other genitourinary organ: Secondary | ICD-10-CM

## 2020-01-08 NOTE — Telephone Encounter (Signed)
Left message to call Katelyn Porter at [phone] regarding labs from 01/04/20 unable to be processed and options for recollection.

## 2020-01-09 LAB — CA 125

## 2020-01-17 NOTE — Progress Notes (Signed)
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   She continues on anastrozole which she tolerates moderately well.  Hot flashes continue to be an issue.  She tried venlafaxine in the past and could not tolerate it.  She underwent baseline bone density screening at [facility] on 02/20/2019. This showed a T-score of +1.9, which is considered normal.  Since her last visit, she underwent breast MRI on 12/26/2019 showing: breast composition B; no evidence of malignancy in either breast.  She is followed by Dr. Sabra Heck for a simple left ovarian cyst. She most recently underwent surveillance pelvic ultrasound on 01/04/2020.   REVIEW OF SYSTEMS: Katelyn Porter generally is doing well.  She does have some exercises around the house, but does not take regular walks.  She has 2 horses she cares for and that keeps her busy plus gardening.  She still has some soreness in both axillae, and that limits her a little bit.  She has significant problems with insomnia, which is partly due to hot flashes partly due to nocturia and partly due to restless legs.  Aside from these issues a detailed review of systems today was stable.   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  She was treated with surgery radiation and tamoxifen for 5 years and we released her from follow-up in 2013.  More recently she had routine screening mammography, on 06/16/2017 showing a possible abnormality in the right breast. She underwent unilateral right breast diagnostic mammography with tomography and right breast ultrasonography at [facility] on 06/21/2017 showing breast density category B. Suspicious new mass in the right breast 9 o'clock central and 4 cm from the nipple measuring 0.8 x 0.6 x 0.7 cm. There was no axillary adenopathy sonographically.   Accordingly on 06/22/2017 she proceeded to biopsy of the right breast area in question. The pathology from this procedure showed ([id number]): Breast, right, needle core biopsy, 9:15 o'clock, UOQ with invasive ductal carcinoma with extracellular mucin, grade 2. Prognostic indicators significant for: estrogen receptor, 100% positive and progesterone receptor, 5% positive, both with strong staining intensity. Proliferation marker Ki67 at 20%. HER2 negative.  The patient's subsequent history is as detailed below.   PAST MEDICAL HISTORY: Past Medical History:  Diagnosis Date  . Breast cancer ([place]) 2008   left, with radiation finished 08, tamoxifen  . Breast cancer ([place]) 2019   Rt. had lymph node removed  having radiation currently  . Complication of anesthesia    woke up with colonoscopy and hysterectomy  . Depression 01/24/2013  . Family history of breast cancer   . Family history of colon cancer   . GERD (gastroesophageal reflux disease)   . Hyperlipidemia   . Hypertension   .  IBS (irritable bowel syndrome)   . Palpitations   . Personal history of radiation therapy 2007   left breast  . Personal history of radiation therapy 2019   right breast  . Vertigo      PAST SURGICAL HISTORY: Past Surgical History:  Procedure Laterality Date  . ABDOMINAL HYSTERECTOMY  1985   TAH (ovaries remain)  . BREAST LUMPECTOMY Left 2007  . BREAST LUMPECTOMY Right 2019  . BREAST LUMPECTOMY WITH RADIOACTIVE SEED AND SENTINEL LYMPH NODE BIOPSY Right 07/22/2017   Procedure: BREAST LUMPECTOMY WITH RADIOACTIVE SEED AND SENTINEL LYMPH NODE BIOPSY;  Surgeon: Erroll Luna, MD;  Location: [facility];  Service: General;  Laterality: Right;  . COLONOSCOPY    . TONSILLECTOMY     age 5    FAMILY HISTORY Family History  Problem Relation Age of Onset  . Lung cancer Father        Died at 81 from lung cancer  . Heart disease Father        heart attack  . Colon cancer Mother 19  . Hypertension Mother   . Kidney disease Mother   . Diabetes Mother   . Hypertension Sister   . Diabetes Sister   . Colon polyps Sister   . Breast cancer Maternal Aunt        dx in her 62s  . Coronary artery disease Brother 7       died of "massive heart attack"  . Diabetes Brother   . Colon cancer Cousin        maternal 1st cousin dx over 36  . Ovarian cancer Paternal Grandmother   . Breast cancer Cousin        mat 1st cousin dx in her 15s  . Esophageal cancer Neg Hx   . Stomach cancer Neg Hx   . Rectal cancer Neg Hx   The patient's father had a history of tongue cancer which apparently spread to his lungs according to the patient. He passed due to a MI at age 76. The patient's mother died at age 57 due to renal failure and diabetes.  She also had a history of colon cancer. The patient had 1 brother who died from an MI; and 1 sister.  There was a maternal aunt diagnosed with breast cancer in the 68's. A maternal cousin passed due to breast cancer. Another maternal cousin had colon cancer. She denies a family history of ovarian cancer.    GYNECOLOGIC HISTORY:  Patient's last menstrual period was 03/17/1983. Menarche: 69 years old. She is GXP0. Her LMP was age 71 after  her hysterectomy without BSO. She took HRT until 2008, discontinued at the time of her initial breast cancer diagnosis.    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 Her husband, Marjory Lies, worked in [employer] before retiring. He passed away January 19, 2018 from a hemorraghic stroke. He had 2 children from a prior marriage, a daughter Zigmund Daniel, who lives in [place] and works for [employer], and a son, Personnel officer, who works for the [employer] in [place], [place]. The patient has 3 step-grandchildren, 1 of whom is now a Designer, jewellery.. She now lives alone. She attends [employer].     ADVANCED DIRECTIVES: Not in place.  At the 01/18/2020 visit the patient was given the appropriate documents to complete and notarized at her discretion.   HEALTH MAINTENANCE: Social History   Tobacco Use  .  Smoking status: Never Smoker  . Smokeless tobacco: Never Used  Vaping Use  . Vaping Use: Never used  Substance Use Topics  . Alcohol use: No  . Drug use: No     Colonoscopy: UTD/Dr. Perry/ normal  PAP: s/p TAHBSO  Bone density: 02/2019, +1.9   Allergies  Allergen Reactions  . Crab [Shellfish Allergy] Swelling    Blisters on tongue.  . Sulfa Antibiotics Other (See Comments)    headache  . Lipitor [Atorvastatin]     Muscle cramping  . Sulfa Drugs Cross Reactors     Current Outpatient Medications  Medication Sig Dispense Refill  . anastrozole (ARIMIDEX) 1 MG tablet Take 1 tablet (1 mg total) by mouth daily. 90 tablet 4  . Cholecalciferol (VITAMIN D3) 1000 units CAPS Take 1 capsule by mouth daily.    . cloNIDine (CATAPRES) 0.1 MG tablet Take 0.1 mg by mouth at bedtime.    . DULoxetine (CYMBALTA) 60 MG capsule Take 60 mg by mouth daily.     . hydroquinone 4 % cream Apply topically.    . Multiple Vitamins-Minerals (ONE-A-DAY WOMENS 50 PLUS PO) Take 1 tablet by mouth daily.    . pantoprazole  (PROTONIX) 40 MG tablet Take 1 tablet (40 mg total) by mouth daily. 90 tablet 3  . rosuvastatin (CRESTOR) 20 MG tablet Take 20 mg by mouth daily.    Marland Kitchen triamterene-hydrochlorothiazide (DYAZIDE) 37.5-25 MG capsule Take by mouth.    Avis Epley SR 150 MG 12 hr tablet Take 150 mg by mouth daily.     No current facility-administered medications for this visit.    OBJECTIVE: African-American woman in no acute distress  Vitals:   01/18/20 0957  BP: 129/68  Pulse: 79  Resp: 17  Temp: 97.6 F (36.4 C)  SpO2: 100%     Body mass index is 38.74 kg/m.   Wt Readings from Last 3 Encounters:  01/18/20 211 lb 12.8 oz (96.1 kg)  01/04/20 212 lb (96.2 kg)  10/13/19 (!) 207 lb (93.9 kg)      ECOG FS:1 - Symptomatic but completely ambulatory  Sclerae unicteric, EOMs intact Wearing a mask No cervical or supraclavicular adenopathy Lungs no rales or rhonchi Heart regular rate and rhythm Abd soft, nontender, positive bowel sounds MSK no focal spinal tenderness, no upper extremity lymphedema Neuro: nonfocal, well oriented, appropriate affect Breasts: On the right the breast is status post lumpectomy and radiation, with no evidence of local recurrence.  Left breast is unremarkable.  Both axillae are benign.   LAB RESULTS:  CMP     Component Value Date/Time   NA 142 08/07/2019 0850   K 3.6 08/07/2019 0850   CL 107 08/07/2019 0850   CO2 26 08/07/2019 0850   GLUCOSE 113 (H) 08/07/2019 0850   BUN 15 08/07/2019 0850   CREATININE 0.98 08/07/2019 0850   CREATININE 0.94 08/23/2018 1006   CREATININE 0.94 04/13/2013 1411   CALCIUM 9.5 08/07/2019 0850   PROT 6.8 10/11/2019 1135   ALBUMIN 4.2 10/11/2019 1135   AST 23 10/11/2019 1135   AST 16 08/23/2018 1006   ALT 23 10/11/2019 1135   ALT 16 08/23/2018 1006   ALKPHOS 73 10/11/2019 1135   BILITOT 0.6 10/11/2019 1135   BILITOT 0.7 08/23/2018 1006   GFRNONAA 60 (L) 08/07/2019 0850   GFRNONAA >60 08/23/2018 1006   GFRNONAA 68 01/24/2013 0942    GFRAA >60 08/07/2019 0850   GFRAA >60 08/23/2018 1006   GFRAA 79 01/24/2013 8341  No results found for: TOTALPROTELP, ALBUMINELP, A1GS, A2GS, BETS, BETA2SER, GAMS, MSPIKE, SPEI  No results found for: KPAFRELGTCHN, LAMBDASER, [facility]  Lab Results  Component Value Date   WBC 6.4 01/18/2020   NEUTROABS 3.4 01/18/2020   HGB 14.5 01/18/2020   HCT 44.3 01/18/2020   MCV 91.2 01/18/2020   PLT 251 01/18/2020    Lab Results  Component Value Date   LABCA2 30 07/28/2010    No components found for: UMPNTI144  No results for input(s): INR in the last 168 hours.  Lab Results  Component Value Date   LABCA2 30 07/28/2010    No results found for: CAN199  Lab Results  Component Value Date   CAN125 CANCELED 01/04/2020    No results found for: RXV400  No results found for: CA2729  No components found for: HGQUANT  No results found for: CEA1 / No results found for: CEA1   No results found for: AFPTUMOR  No results found for: CHROMOGRNA  No results found for: HGBA, HGBA2QUANT, HGBFQUANT, HGBSQUAN (Hemoglobinopathy evaluation)   No results found for: LDH  No results found for: IRON, TIBC, IRONPCTSAT (Iron and TIBC)  No results found for: FERRITIN  Urinalysis    Component Value Date/Time   COLORURINE YELLOW 10/22/2018 2130   APPEARANCEUR CLEAR 10/22/2018 2130   LABSPEC <1.005 (L) 10/22/2018 2130   PHURINE 6.5 10/22/2018 2130   GLUCOSEU NEGATIVE 10/22/2018 2130   HGBUR NEGATIVE 10/22/2018 2130   BILIRUBINUR NEGATIVE 10/22/2018 2130   BILIRUBINUR n 04/13/2013 1318   KETONESUR NEGATIVE 10/22/2018 2130   PROTEINUR NEGATIVE 10/22/2018 2130   UROBILINOGEN negative 04/13/2013 1318   NITRITE NEGATIVE 10/22/2018 2130   LEUKOCYTESUR NEGATIVE 10/22/2018 2130    STUDIES: US PELVIS TRANSVAGINAL NON-OB (TV ONLY)  Result Date: 01/04/2020 SEE PROGRESS NOTES FOR RESULTS   MR BREAST BILATERAL W WO CONTRAST INC CAD  Result Date: 12/26/2019 CLINICAL DATA:  High risk  patient ATM mutation; Breast cancer screening, high risk. History of bilateral lumpectomies. LABS:  None. EXAM: BILATERAL BREAST MRI WITH AND WITHOUT CONTRAST TECHNIQUE: Multiplanar, multisequence MR images of both breasts were obtained prior to and following the intravenous administration of 10 ml of Gadavist Three-dimensional MR images were rendered by post-processing of the original MR data on an independent workstation. The three-dimensional MR images were interpreted, and findings are reported in the following complete MRI report for this study. Three dimensional images were evaluated at the independent interpreting workstation using the DynaCAD thin client. COMPARISON:  Previous exam(s). FINDINGS: Breast composition: b. Scattered fibroglandular tissue. Background parenchymal enhancement: Minimal Right breast: There are postsurgical changes from prior lumpectomy in the upper outer quadrant. No new suspicious enhancement at the lumpectomy site or elsewhere in the right breast. Stable appearance of mild diffuse skin thickening. Left breast: Postsurgical changes are noted in the lateral left breast. No new mass or abnormal enhancement. Lymph nodes: No abnormal appearing lymph nodes. Ancillary findings:  None. IMPRESSION: No MRI evidence of malignancy in either breast. RECOMMENDATION: 1.  Continue annual screening mammography. 2.  Bilateral screening breast MRI in 1 year. BI-RADS CATEGORY  2: Benign. Electronically Signed   By: Audie Pinto M.D.   On: 12/26/2019 15:05     ELIGIBLE FOR AVAILABLE RESEARCH PROTOCOL: no  ASSESSMENT: 69 y.o. [place] , [place] woman status post left lumpectomy February 2008 for a grade 3 ductal carcinoma in situ which was strongly estrogen and progesterone receptor positive.  (a)   adjuvant radiation completed May 2008  (b)  on  tamoxifen July 2008 through March 2013.  (1) status post right breast upper outer quadrant biopsy 06/22/2017 for a clinnical T1b N0, stage IA  invasive ductal carcinoma, with extracellular mucin; grade 2; estrogen and progesterone receptor positive, HER-2 not amplified, with an MIB-1 of 20%.  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 The following genes were evaluated for sequence changes only: HOXB13*, NTHL1*, SDHA   (c) patients with ATM mutations are at increased risk of breast cancer potential increase in ovarian cancer and pancreatic cancer risk  (3) Oncotype obtained from the 06/22/2017 biopsy showed a score of 25, predicting a risk of recurrence outside the breast over the next 9 years of 12% if the patient took antiestrogens for 5 years.  It also shows no benefit from chemotherapy.  (4) status post right lumpectomy and sentinel lymph node sampling 07/22/2017 for a pT1b pN0, stage Ia invasive ductal carcinoma, grade 2, with extracellular mucin, and negative margins.  (a) a total of 3 lymph nodes were removed  (5) adjuvant radiation 08/31/2017 to 10/19/2017 Site/dose:    1. The Right breast was treated to 50.4 Gy in 28 fractions of 1.8 Gy. 2. The Right breast was boosted to 10 Gy in 5 fractions of 2 Gy.  (6) started anastrozole 11/14/2017  (a) bone density December 2020  (7) intensified screening:   (a) breast MRI December 2020, repeat October 2021 and yearly  (b) mammography 06/26/2019, repeat April 2022 and yearly  (8) ovarian cancer screening: per Dr Sabra Heck   PLAN: Katelyn Porter is now 2-1/2 years out from definitive surgery for  her breast cancer with no evidence of disease recurrence.  This is very favorable.  She is tolerating anastrozole generally well and the plan will be to continue that a minimum of 5 years.  We discussed some strategies which might allow her to sleep a little bit better through the night.  She will particularly stop having anything to drink after 8 PM.  I think she could easily increase her exercise program.  She has equipment at home and a nice area to walk around then.  She should aim for 30 minutes twice daily of any exercise that she would like.  In particular anything having to do with range of motion would be helpful.  Otherwise she will see me again in 1 year.  She will have an MRI again prior to that visit.  She knows to call for any other issues may develop before that time  Total encounter time 20 minutes.*    Aicha Clingenpeel, Virgie Dad, MD  01/18/20 10:15 AM Medical Oncology and Hematology [facility] [place], [place] [address] Tel. [phone]    Fax. [phone]   I, Wilburn Mylar, am acting as scribe for Dr. Virgie Dad. Joeziah Voit.  I, Lurline Del MD, have reviewed the above documentation for accuracy and completeness, and I agree with the above.   *Total Encounter Time as defined by the Centers for Medicare and Medicaid Services includes, in addition to the face-to-face time of a patient visit (  documented in the note above) non-face-to-face time: obtaining and reviewing outside history, ordering and reviewing medications, tests or procedures, care coordination (communications with other health care professionals or caregivers) and documentation in the medical record.

## 2020-01-18 ENCOUNTER — Inpatient Hospital Stay: Payer: Medicare HMO

## 2020-01-18 ENCOUNTER — Other Ambulatory Visit: Payer: Self-pay

## 2020-01-18 ENCOUNTER — Inpatient Hospital Stay: Payer: Medicare HMO | Attending: Oncology | Admitting: Oncology

## 2020-01-18 VITALS — BP 129/68 | HR 79 | Temp 97.6°F | Resp 17 | Ht 62.0 in | Wt 211.8 lb

## 2020-01-18 DIAGNOSIS — D0512 Intraductal carcinoma in situ of left breast: Secondary | ICD-10-CM

## 2020-01-18 DIAGNOSIS — Z79811 Long term (current) use of aromatase inhibitors: Secondary | ICD-10-CM | POA: Diagnosis not present

## 2020-01-18 DIAGNOSIS — C50411 Malignant neoplasm of upper-outer quadrant of right female breast: Secondary | ICD-10-CM

## 2020-01-18 DIAGNOSIS — C50911 Malignant neoplasm of unspecified site of right female breast: Secondary | ICD-10-CM

## 2020-01-18 DIAGNOSIS — Z170 Estrogen receptor positive status [ER+]: Secondary | ICD-10-CM | POA: Diagnosis not present

## 2020-01-18 DIAGNOSIS — Z1239 Encounter for other screening for malignant neoplasm of breast: Secondary | ICD-10-CM

## 2020-01-18 DIAGNOSIS — C50912 Malignant neoplasm of unspecified site of left female breast: Secondary | ICD-10-CM

## 2020-01-18 HISTORY — DX: Encounter for other screening for malignant neoplasm of breast: Z12.39

## 2020-01-18 LAB — CBC WITH DIFFERENTIAL/PLATELET
Abs Immature Granulocytes: 0.01 10*3/uL (ref 0.00–0.07)
Basophils Absolute: 0.1 10*3/uL (ref 0.0–0.1)
Basophils Relative: 1 %
Eosinophils Absolute: 0.2 10*3/uL (ref 0.0–0.5)
Eosinophils Relative: 3 %
HCT: 44.3 % (ref 36.0–46.0)
Hemoglobin: 14.5 g/dL (ref 12.0–15.0)
Immature Granulocytes: 0 %
Lymphocytes Relative: 35 %
Lymphs Abs: 2.2 10*3/uL (ref 0.7–4.0)
MCH: 29.8 pg (ref 26.0–34.0)
MCHC: 32.7 g/dL (ref 30.0–36.0)
MCV: 91.2 fL (ref 80.0–100.0)
Monocytes Absolute: 0.5 10*3/uL (ref 0.1–1.0)
Monocytes Relative: 8 %
Neutro Abs: 3.4 10*3/uL (ref 1.7–7.7)
Neutrophils Relative %: 53 %
Platelets: 251 10*3/uL (ref 150–400)
RBC: 4.86 MIL/uL (ref 3.87–5.11)
RDW: 12.1 % (ref 11.5–15.5)
WBC: 6.4 10*3/uL (ref 4.0–10.5)
nRBC: 0 % (ref 0.0–0.2)

## 2020-01-18 LAB — COMPREHENSIVE METABOLIC PANEL
ALT: 26 U/L (ref 0–44)
AST: 26 U/L (ref 15–41)
Albumin: 4.1 g/dL (ref 3.5–5.0)
Alkaline Phosphatase: 91 U/L (ref 38–126)
Anion gap: 6 (ref 5–15)
BUN: 18 mg/dL (ref 8–23)
CO2: 30 mmol/L (ref 22–32)
Calcium: 9.9 mg/dL (ref 8.9–10.3)
Chloride: 105 mmol/L (ref 98–111)
Creatinine, Ser: 1 mg/dL (ref 0.44–1.00)
GFR, Estimated: >60 mL/min (ref >60)
Glucose, Bld: 114 mg/dL — ABNORMAL HIGH (ref 70–99)
Potassium: 4.2 mmol/L (ref 3.5–5.1)
Sodium: 141 mmol/L (ref 135–145)
Total Bilirubin: 0.7 mg/dL (ref 0.3–1.2)
Total Protein: 7.2 g/dL (ref 6.5–8.1)

## 2020-01-18 MED ORDER — ANASTROZOLE 1 MG PO TABS
1.0000 mg | ORAL_TABLET | Freq: Every day | ORAL | 4 refills | Status: DC
Start: 2020-01-18 — End: 2020-10-25

## 2020-01-19 NOTE — Telephone Encounter (Signed)
Left message to call Katelyn Porter at [phone]. 

## 2020-01-25 NOTE — Telephone Encounter (Signed)
Spoke with patient. Advised of message as seen below. Reviewed options for recollection.   Lab appt scheduled at [facility] on 01/26/20 at 10:55am. Patient is agreeable to date and time.   New order placed. Previously ordered by Dr. Sabra Heck Dx: Ovarian neoplasm.    Routing to Dr. Antony Blackbird.   Encounter closed.

## 2020-01-26 ENCOUNTER — Other Ambulatory Visit: Payer: Self-pay

## 2020-01-26 ENCOUNTER — Other Ambulatory Visit (INDEPENDENT_AMBULATORY_CARE_PROVIDER_SITE_OTHER): Payer: Medicare HMO

## 2020-01-26 DIAGNOSIS — D4959 Neoplasm of unspecified behavior of other genitourinary organ: Secondary | ICD-10-CM

## 2020-01-27 LAB — CA 125: Cancer Antigen (CA) 125: 10.1 U/mL (ref 0.0–38.1)

## 2020-01-31 ENCOUNTER — Telehealth: Payer: Self-pay | Admitting: *Deleted

## 2020-01-31 NOTE — Telephone Encounter (Signed)
Message left to return call to Triage Nurse at [phone].    

## 2020-01-31 NOTE — Telephone Encounter (Signed)
-----   Message from Nunzio Cobbs, MD sent at 01/31/2020  1:12 PM EST ----- This is Dr. Quincy Simmonds reviewing labs recommended by Dr. Sabra Heck.   Please contact patient with results of her CA125 which are normal at a level of 10.1. This is essentially stable since her prior level of 9.5 last year.   She is due for an annual exam in January, 2022 and for her next pelvic ultrasound in April, 2022.  Nothing is currently scheduled.

## 2020-01-31 NOTE — Telephone Encounter (Signed)
Patient returned call. Results reviewed with patient as seen below from Dr. Quincy Simmonds and patient verbalized understanding. Patient states that she plans on establishing with Dr. Sabra Heck at her new practice. Has called and left a message to schedule aex.   Encounter closed.

## 2020-03-29 ENCOUNTER — Ambulatory Visit: Payer: Medicare Other | Admitting: Obstetrics & Gynecology

## 2020-05-27 ENCOUNTER — Other Ambulatory Visit: Payer: Self-pay | Admitting: Internal Medicine

## 2020-05-27 DIAGNOSIS — Z1231 Encounter for screening mammogram for malignant neoplasm of breast: Secondary | ICD-10-CM

## 2020-06-26 DIAGNOSIS — G8929 Other chronic pain: Secondary | ICD-10-CM | POA: Insufficient documentation

## 2020-06-26 DIAGNOSIS — M4726 Other spondylosis with radiculopathy, lumbar region: Secondary | ICD-10-CM | POA: Insufficient documentation

## 2020-06-26 HISTORY — DX: Other chronic pain: G89.29

## 2020-06-26 HISTORY — DX: Other spondylosis with radiculopathy, lumbar region: M47.26

## 2020-07-11 ENCOUNTER — Ambulatory Visit (HOSPITAL_BASED_OUTPATIENT_CLINIC_OR_DEPARTMENT_OTHER): Payer: Medicare HMO | Admitting: Obstetrics & Gynecology

## 2020-07-17 ENCOUNTER — Other Ambulatory Visit: Payer: Self-pay

## 2020-07-17 ENCOUNTER — Ambulatory Visit
Admission: RE | Admit: 2020-07-17 | Discharge: 2020-07-17 | Disposition: A | Discharge status: Home / Self Care | Payer: Medicare HMO | Source: Ambulatory Visit | Attending: Internal Medicine | Admitting: Internal Medicine

## 2020-07-17 DIAGNOSIS — Z1231 Encounter for screening mammogram for malignant neoplasm of breast: Secondary | ICD-10-CM

## 2020-07-30 DIAGNOSIS — G2581 Restless legs syndrome: Secondary | ICD-10-CM | POA: Insufficient documentation

## 2020-07-30 HISTORY — DX: Restless legs syndrome: G25.81

## 2020-09-11 ENCOUNTER — Encounter: Payer: Self-pay | Admitting: Internal Medicine

## 2020-09-18 ENCOUNTER — Telehealth: Payer: Self-pay

## 2020-09-18 NOTE — Telephone Encounter (Signed)
Called pt for phone previsit.  Pt explained she was seen in the ER in April 2022 for vomiting in her sleep.  Pt said she was also having bloating.  Pt reported ER md Darl Householder MD said it may be GERD and that she may need an EGD to evaluate.  Pt decide to cancel colon scheduled for 10/02/20 and see Dr. Henrene Pastor in the office to determine if she needs a EGD/COLON.  Colon was being done for hx of colon polyps. Pt was scheduled appoint with Dr. Henrene Pastor and she is aware of appointment date and time.

## 2020-09-19 ENCOUNTER — Other Ambulatory Visit: Payer: Self-pay

## 2020-09-19 ENCOUNTER — Ambulatory Visit (INDEPENDENT_AMBULATORY_CARE_PROVIDER_SITE_OTHER): Payer: Medicare HMO | Admitting: Obstetrics & Gynecology

## 2020-09-19 ENCOUNTER — Encounter (HOSPITAL_BASED_OUTPATIENT_CLINIC_OR_DEPARTMENT_OTHER): Payer: Self-pay | Admitting: Obstetrics & Gynecology

## 2020-09-19 VITALS — BP 132/58 | HR 83 | Ht 62.5 in | Wt 215.6 lb

## 2020-09-19 DIAGNOSIS — Z780 Asymptomatic menopausal state: Secondary | ICD-10-CM

## 2020-09-19 DIAGNOSIS — Z170 Estrogen receptor positive status [ER+]: Secondary | ICD-10-CM | POA: Diagnosis not present

## 2020-09-19 DIAGNOSIS — C50411 Malignant neoplasm of upper-outer quadrant of right female breast: Secondary | ICD-10-CM | POA: Diagnosis not present

## 2020-09-19 DIAGNOSIS — N83202 Unspecified ovarian cyst, left side: Secondary | ICD-10-CM | POA: Diagnosis not present

## 2020-09-19 DIAGNOSIS — Z9189 Other specified personal risk factors, not elsewhere classified: Secondary | ICD-10-CM

## 2020-09-19 DIAGNOSIS — Z9289 Personal history of other medical treatment: Secondary | ICD-10-CM | POA: Diagnosis not present

## 2020-09-19 DIAGNOSIS — Z1379 Encounter for other screening for genetic and chromosomal anomalies: Secondary | ICD-10-CM

## 2020-09-19 DIAGNOSIS — Z800 Family history of malignant neoplasm of digestive organs: Secondary | ICD-10-CM

## 2020-09-19 DIAGNOSIS — Z803 Family history of malignant neoplasm of breast: Secondary | ICD-10-CM

## 2020-09-19 NOTE — Progress Notes (Signed)
70 y.o. G1P0 Widowed Black or [place] American female here  for breast and pelvic exam.  I am also following her for history of breast cancer twice.  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  Last MRI was 12/2019.  Sees Dr. [name] yearly.   MRI results and Dr. Virgie Dad note reviewed.  Also has a simple ovarian cyst that I have been following.  Was on anastrozole but stopped due to the hot flashes and side effects.  Took it over two years.  Felt she just couldn't take the side effects.  She reports she felt so overwhelmed at times.  Feels much better now.  Denies vaginal bleeding.  Patient's last menstrual period was 03/17/1983.          Sexually active: No.  H/O STD:  no  Health Maintenance: PCP:  Dr. Suzy Bouchard.  Last wellness appt was 01/2020.  Did blood work at that appt:   Vaccines are up to date:  yes except she has nto done the shingrix vaccination Colonoscopy:  08/2017.  Follow up 3 years.  Has appt to see Dr. Henrene Pastor in August and will have colonoscopy and endoscopy done this year MMG:  07/2020 BMD:  2019 normal.  Off Anastrozole.   Last pap smear:  1985, h/o TAH.   H/o abnormal pap smear:  no   reports that she has never smoked. She has never used smokeless tobacco. She reports that she does not drink alcohol and does not use drugs.  Past Medical History:  Diagnosis Date   Breast cancer ([place]) 2008   left, with radiation finished 08, tamoxifen   Breast cancer ([place]) 2019   Rt. had lymph node removed  having radiation currently   Complication of anesthesia    woke up with colonoscopy and hysterectomy   Depression 01/24/2013   Family history of breast cancer    Family history of colon cancer    GERD (gastroesophageal reflux disease)    Hyperlipidemia    Hypertension    IBS (irritable bowel syndrome)    Palpitations    Personal history of radiation therapy 2007   left breast   Personal history of radiation therapy 2019   right breast    Vertigo     Past Surgical History:  Procedure Laterality Date   ABDOMINAL HYSTERECTOMY  1985   TAH (ovaries remain)   BREAST LUMPECTOMY Left 2007   BREAST LUMPECTOMY Right 2019   BREAST LUMPECTOMY WITH RADIOACTIVE SEED AND SENTINEL LYMPH NODE BIOPSY Right 07/22/2017   Procedure: BREAST LUMPECTOMY WITH RADIOACTIVE SEED AND SENTINEL LYMPH NODE BIOPSY;  Surgeon: Erroll Luna, MD;  Location: [facility];  Service: General;  Laterality: Right;   COLONOSCOPY     TONSILLECTOMY     age 62    Current Outpatient Medications  Medication Sig Dispense Refill   Cholecalciferol (VITAMIN D3) 1000 units CAPS Take 1 capsule by mouth daily.     cloNIDine (CATAPRES) 0.1 MG tablet Take 0.1 mg by mouth at bedtime.     DULoxetine (CYMBALTA) 60 MG capsule Take 60 mg by mouth daily.      hydroquinone 4 % cream Apply topically.     Multiple Vitamins-Minerals (ONE-A-DAY WOMENS 50 PLUS PO) Take 1 tablet by mouth daily.     pantoprazole (PROTONIX) 40 MG tablet Take 1 tablet (40 mg total) by mouth daily. 90 tablet 3   rosuvastatin (CRESTOR) 20 MG tablet Take 20 mg by mouth daily.  triamterene-hydrochlorothiazide (DYAZIDE) 37.5-25 MG capsule Take by mouth.     WELLBUTRIN SR 150 MG 12 hr tablet Take 150 mg by mouth daily.     anastrozole (ARIMIDEX) 1 MG tablet Take 1 tablet (1 mg total) by mouth daily. (Patient not taking: Reported on 09/19/2020) 90 tablet 4   No current facility-administered medications for this visit.    Family History  Problem Relation Age of Onset   Lung cancer Father        Died at 78 from lung cancer   Heart disease Father        heart attack   Colon cancer Mother 78   Hypertension Mother    Kidney disease Mother    Diabetes Mother    Hypertension Sister    Diabetes Sister    Colon polyps Sister    Breast cancer Maternal Aunt        dx in her 58s   Coronary artery disease Brother 54       died of "massive heart attack"   Diabetes Brother    Colon cancer Cousin         maternal 1st cousin dx over 16   Ovarian cancer Paternal Grandmother    Breast cancer Cousin        mat 1st cousin dx in her 44s   Esophageal cancer Neg Hx    Stomach cancer Neg Hx    Rectal cancer Neg Hx     Review of Systems  All other systems reviewed and are negative.  Exam:   BP (!) 132/58 (BP Location: Left Arm, Patient Position: Sitting, Cuff Size: Large)   Pulse 83   Ht 5' 2.5" (1.588 m)   Wt 215 lb 9.6 oz (97.8 kg)   LMP 03/17/1983   BMI 38.81 kg/m   Height: 5' 2.5" (158.8 cm)  General appearance: alert, cooperative and appears stated age Breasts:  well healed scars, no new masses, skin changes, nipple discharge, or ALD Abdomen: soft, non-tender; bowel sounds normal; no masses,  no organomegaly Lymph nodes: Cervical, supraclavicular, and axillary nodes normal.  No abnormal inguinal nodes palpated Neurologic: Grossly normal  Pelvic: External genitalia:  no lesions              Urethra:  normal appearing urethra with no masses, tenderness or lesions              Bartholins and Skenes: normal                 Vagina: normal appearing vagina with atrophic changes and no discharge, no lesions              Cervix: absent              Pap taken: No. Bimanual Exam:  Uterus:  uterus absent              Adnexa: no mass, fullness, tenderness               Rectovaginal: Confirms               Anus:  normal sphincter tone, no lesions  Chaperone, Octaviano Batty, CMA, was present for exam.  Assessment/Plan: 1. GYN exam for high-risk Medicare patient - pap not indicated - MMG 2022 - will scheduled colonoscopy this summer with Dr. Henrene Pastor - plan BMD 2024 - vaccines reviewed - lab work done with PCP  2. Postmenopausal - no HRT  3. Left ovarian cyst - US PELVIS TRANSVAGINAL NON-OB (TV ONLY);  Future  4. Family history of breast cancer  5. Genetic testing - ATM gene mutation with increased breast cancer risks, having yearly breast MRI  6. Family history of colon  cancer

## 2020-10-02 ENCOUNTER — Encounter: Payer: Medicare HMO | Admitting: Internal Medicine

## 2020-10-07 ENCOUNTER — Other Ambulatory Visit: Payer: Self-pay

## 2020-10-07 ENCOUNTER — Ambulatory Visit (HOSPITAL_BASED_OUTPATIENT_CLINIC_OR_DEPARTMENT_OTHER)
Admission: RE | Admit: 2020-10-07 | Discharge: 2020-10-07 | Disposition: A | Discharge status: Home / Self Care | Payer: Medicare HMO | Source: Ambulatory Visit | Attending: Obstetrics & Gynecology | Admitting: Obstetrics & Gynecology

## 2020-10-07 DIAGNOSIS — N83202 Unspecified ovarian cyst, left side: Secondary | ICD-10-CM | POA: Insufficient documentation

## 2020-10-25 ENCOUNTER — Encounter: Payer: Self-pay | Admitting: Internal Medicine

## 2020-10-25 ENCOUNTER — Ambulatory Visit (INDEPENDENT_AMBULATORY_CARE_PROVIDER_SITE_OTHER): Payer: Medicare HMO | Admitting: Internal Medicine

## 2020-10-25 VITALS — BP 140/60 | HR 80 | Ht 62.0 in | Wt 213.6 lb

## 2020-10-25 DIAGNOSIS — R112 Nausea with vomiting, unspecified: Secondary | ICD-10-CM

## 2020-10-25 DIAGNOSIS — R194 Change in bowel habit: Secondary | ICD-10-CM

## 2020-10-25 DIAGNOSIS — Z86010 Personal history of colonic polyps: Secondary | ICD-10-CM

## 2020-10-25 DIAGNOSIS — R6881 Early satiety: Secondary | ICD-10-CM

## 2020-10-25 DIAGNOSIS — K2100 Gastro-esophageal reflux disease with esophagitis, without bleeding: Secondary | ICD-10-CM | POA: Diagnosis not present

## 2020-10-25 DIAGNOSIS — R1013 Epigastric pain: Secondary | ICD-10-CM | POA: Diagnosis not present

## 2020-10-25 DIAGNOSIS — R140 Abdominal distension (gaseous): Secondary | ICD-10-CM

## 2020-10-25 MED ORDER — SUTAB 1479-225-188 MG PO TABS: 1.0000 | ORAL_TABLET | Freq: Once | ORAL | 0 refills | Status: AC

## 2020-10-25 NOTE — Patient Instructions (Signed)
If you are age 69 or older, your body mass index should be between 23-30. Your Body mass index is 39.07 kg/m. If this is out of the aforementioned range listed, please consider follow up with your Primary Care Provider.  If you are age 38 or younger, your body mass index should be between 19-25. Your Body mass index is 39.07 kg/m. If this is out of the aformentioned range listed, please consider follow up with your Primary Care Provider.   __________________________________________________________  The [facility] providers would like to encourage you to use [facility] to communicate with providers for non-urgent requests or questions.  Due to long hold times on the telephone, sending your provider a message by [facility] may be a faster and more efficient way to get a response.  Please allow 48 business hours for a response.  Please remember that this is for non-urgent requests.   You have been scheduled for an endoscopy and colonoscopy. Please follow the written instructions given to you at your visit today. Please pick up your prep supplies at the pharmacy within the next 1-3 days. If you use inhalers (even only as needed), please bring them with you on the day of your procedure.  Take 2 tablespoons of Citrucel in water or juice daily

## 2020-10-25 NOTE — Progress Notes (Signed)
HISTORY OF PRESENT ILLNESS:  Katelyn Porter is a 70 y.o. female with multiple medical problems as listed below.  She presents today with chief complaints of postprandial bloating with occasional vomiting and alternating bowel habits.  She was last seen in this office October 11, 2019 regarding upper abdominal pain, chronic bloating, and GERD.  See that dictation.  Upper endoscopy was performed October 13, 2019.  This was normal except for small hiatal hernia and small benign fundic gland type polyps.  Patient saw her PCP in April with ongoing abdominal complaints.  Abdominal ultrasound was performed July 11, 2020.  This was unremarkable.  Unremarkable gallbladder.  Last complete colonoscopy was performed June 2019.  She was found to have multiple advanced adenomatous polyps for which follow-up in 3 years was recommended.  The patient states that she will occasionally vomit.  Generally dry heaves.  She does have some early satiety.  There has been no weight loss.  No new medications.  She also describes alternating bowel habits between constipation and diarrhea with rectal burning.  She also describes nocturnal regurgitation and regurgitation when bending forward.  REVIEW OF SYSTEMS:  All non-GI ROS negative unless otherwise stated in the HPI.  Past Medical History:  Diagnosis Date   Breast cancer ([place]) 2008   left, with radiation finished 08, tamoxifen   Breast cancer ([place]) 2019   Rt. had lymph node removed  having radiation currently   Complication of anesthesia    woke up with colonoscopy and hysterectomy   Depression 01/24/2013   Family history of breast cancer    Family history of colon cancer    GERD (gastroesophageal reflux disease)    Hyperlipidemia    Hypertension    IBS (irritable bowel syndrome)    Palpitations    Personal history of radiation therapy 2007   left breast   Personal history of radiation therapy 2019   right breast   Vertigo     Past Surgical History:  Procedure  Laterality Date   ABDOMINAL HYSTERECTOMY  1985   TAH (ovaries remain)   BREAST LUMPECTOMY Left 2007   BREAST LUMPECTOMY Right 2019   BREAST LUMPECTOMY WITH RADIOACTIVE SEED AND SENTINEL LYMPH NODE BIOPSY Right 07/22/2017   Procedure: BREAST LUMPECTOMY WITH RADIOACTIVE SEED AND SENTINEL LYMPH NODE BIOPSY;  Surgeon: Erroll Luna, MD;  Location: [facility];  Service: General;  Laterality: Right;   COLONOSCOPY     TONSILLECTOMY     age 55    Social History [name]  reports that she has never smoked. She has never used smokeless tobacco. She reports that she does not drink alcohol and does not use drugs.  family history includes Breast cancer in her cousin and maternal aunt; Colon cancer in her cousin; Colon cancer (age of onset: 40) in her mother; Colon polyps in her sister; Coronary artery disease (age of onset: 57) in her brother; Diabetes in her brother, mother, and sister; Heart disease in her father; Hypertension in her mother and sister; Kidney disease in her mother; Lung cancer in her father; Ovarian cancer in her paternal grandmother.  Allergies  Allergen Reactions   Crab [Shellfish Allergy] Swelling    Blisters on tongue.   Sulfa Antibiotics Other (See Comments)    headache   Lipitor [Atorvastatin]     Muscle cramping   Sulfa Drugs Cross Reactors        PHYSICAL EXAMINATION: Vital signs: BP 140/60   Pulse 80   Ht '5\' 2"'$  (  1.575 m)   Wt 213 lb 9.6 oz (96.9 kg)   LMP 03/17/1983   BMI 39.07 kg/m   Constitutional: generally well-appearing, no acute distress Psychiatric: alert and oriented x3, cooperative Eyes: extraocular movements intact, anicteric, conjunctiva pink Mouth: oral pharynx moist, no lesions Neck: supple no lymphadenopathy Cardiovascular: heart regular rate and rhythm, no murmur Lungs: clear to auscultation bilaterally Abdomen: soft, nontender, nondistended, no obvious ascites, no peritoneal signs, normal bowel sounds, no organomegaly.   No succussion splash Rectal: Deferred until colonoscopy Extremities: no clubbing, cyanosis, or lower extremity edema bilaterally Skin: no lesions on visible extremities Neuro: No focal deficits.  Cranial nerves intact  ASSESSMENT:  1.  Chronic bloating and abdominal discomfort.  Intermittent vomiting.  Suspect related to GERD exacerbated by obesity. 2.  GERD. 3.  Obesity 4.  History of multiple and advanced adenomatous colon polyps.  Last examination June 2019.  Due for surveillance 5.  General medical problems 6.  Alternating bowel habits   PLAN:  1.  Reflux precautions with attention to weight loss 2.  Continue PPI 3.  Schedule upper endoscopy to evaluate upper abdominal complaints and vomiting.The nature of the procedure, as well as the risks, benefits, and alternatives were carefully and thoroughly reviewed with the patient. Ample time for discussion and questions allowed. The patient understood, was satisfied, and agreed to proceed.  4.  Schedule surveillance colonoscopy.The nature of the procedure, as well as the risks, benefits, and alternatives were carefully and thoroughly reviewed with the patient. Ample time for discussion and questions allowed. The patient understood, was satisfied, and agreed to proceed.  5.  Recommend Citrucel 2 tablespoons daily for alternating bowel habits. A total time of 40 minutes was spent preparing to see the patient, reviewing test, obtaining comprehensive history, performing medically appropriate physical examination, counseling the patient regarding her above listed problems, ordering medications, ordering advanced endoscopic procedures, and documenting clinical information in the health record

## 2020-11-25 ENCOUNTER — Encounter: Payer: Medicare HMO | Admitting: Internal Medicine

## 2020-12-16 ENCOUNTER — Encounter (HOSPITAL_BASED_OUTPATIENT_CLINIC_OR_DEPARTMENT_OTHER): Payer: Self-pay

## 2020-12-16 ENCOUNTER — Emergency Department (HOSPITAL_BASED_OUTPATIENT_CLINIC_OR_DEPARTMENT_OTHER)
Admission: EM | Admit: 2020-12-16 | Discharge: 2020-12-16 | Disposition: A | Discharge status: Home / Self Care | Payer: Medicare HMO | Attending: Emergency Medicine | Admitting: Emergency Medicine

## 2020-12-16 ENCOUNTER — Other Ambulatory Visit: Payer: Self-pay

## 2020-12-16 DIAGNOSIS — J029 Acute pharyngitis, unspecified: Secondary | ICD-10-CM | POA: Diagnosis present

## 2020-12-16 DIAGNOSIS — J020 Streptococcal pharyngitis: Secondary | ICD-10-CM | POA: Insufficient documentation

## 2020-12-16 DIAGNOSIS — Z853 Personal history of malignant neoplasm of breast: Secondary | ICD-10-CM | POA: Insufficient documentation

## 2020-12-16 DIAGNOSIS — Z20822 Contact with and (suspected) exposure to covid-19: Secondary | ICD-10-CM | POA: Insufficient documentation

## 2020-12-16 DIAGNOSIS — I10 Essential (primary) hypertension: Secondary | ICD-10-CM | POA: Insufficient documentation

## 2020-12-16 LAB — RESP PANEL BY RT-PCR (FLU A&B, COVID) ARPGX2
Influenza A by PCR: NEGATIVE
Influenza B by PCR: NEGATIVE
SARS Coronavirus 2 by RT PCR: NEGATIVE

## 2020-12-16 LAB — GROUP A STREP BY PCR: Group A Strep by PCR: NOT DETECTED

## 2020-12-16 MED ORDER — HYDROCODONE-ACETAMINOPHEN 5-325 MG PO TABS: 1.0000 | ORAL_TABLET | ORAL | 0 refills | Status: DC | PRN

## 2020-12-16 MED ORDER — AMOXICILLIN 500 MG PO CAPS
500.0000 mg | ORAL_CAPSULE | Freq: Once | ORAL | Status: AC
  Administered 2020-12-16: 500 mg via ORAL
  Filled 2020-12-16: qty 1

## 2020-12-16 MED ORDER — AMOXICILLIN 500 MG PO CAPS: 500.0000 mg | ORAL_CAPSULE | Freq: Three times a day (TID) | ORAL | 0 refills | Status: DC

## 2020-12-16 MED ORDER — LIDOCAINE VISCOUS HCL 2 % MT SOLN
15.0000 mL | Freq: Once | OROMUCOSAL | Status: AC
  Administered 2020-12-16: 15 mL via OROMUCOSAL
  Filled 2020-12-16: qty 15

## 2020-12-16 NOTE — ED Notes (Signed)
Verbalized understanding discharge instructions, prescriptions, and follow-up. In no acute distress.   

## 2020-12-16 NOTE — ED Triage Notes (Signed)
Pt c/o sore throat x 3-4 days. States difficulty swallowing, unable to swallow pills. States now choking when taking water. Able to handle secretions.

## 2020-12-16 NOTE — ED Provider Notes (Signed)
[facility] EMERGENCY DEPARTMENT Provider Note   CSN: [id number] Arrival date & time: 12/16/20  1443     History Chief Complaint  Patient presents with   Sore Throat    Katelyn Porter is a 70 y.o. female.  Pt presents to the ED today with a sore throat.  The pt said she is having trouble swallowing her pills.  She said they get "hung up" and she chokes on them.  No f/c. She is able to swallow her secretions.  Sx started on Thursday, 9/29.      Past Medical History:  Diagnosis Date   Breast cancer ([place]) 2008   left, with radiation finished 08, tamoxifen   Breast cancer ([place]) 2019   Rt. had lymph node removed  having radiation currently   Complication of anesthesia    woke up with colonoscopy and hysterectomy   Depression 01/24/2013   Family history of breast cancer    Family history of colon cancer    GERD (gastroesophageal reflux disease)    Hyperlipidemia    Hypertension    IBS (irritable bowel syndrome)    Palpitations    Personal history of radiation therapy 2007   left breast   Personal history of radiation therapy 2019   right breast   Vertigo     Patient Active Problem List   Diagnosis Date Noted   Breast cancer screening, high risk patient 01/18/2020   Tinnitus 15/40/0867   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   Hot flashes related to aromatase inhibitor therapy 04/21/2018   Family history of ovarian cancer 07/21/2017   Genetic testing 07/13/2017   Family history of breast cancer    Family history of colon cancer    Malignant neoplasm of upper-outer quadrant of right breast in female, estrogen receptor positive ([place]) 06/29/2017   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, 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/2017   Hyperlipidemia 07/23/2016   Essential (primary) hypertension 05/11/2014   Gastro-esophageal reflux disease without esophagitis 01/24/2013   Recurrent major depressive disorder, 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    Routine general medical examination at a health care facility 06/22/2012   Other malaise and fatigue 05/10/2012   Unspecified constipation 05/10/2012    Past Surgical History:  Procedure Laterality Date   ABDOMINAL HYSTERECTOMY  1985   TAH (ovaries remain)   BREAST LUMPECTOMY Left 2007   BREAST LUMPECTOMY Right 2019   BREAST LUMPECTOMY WITH RADIOACTIVE SEED AND SENTINEL LYMPH NODE BIOPSY Right 07/22/2017   Procedure: BREAST LUMPECTOMY WITH RADIOACTIVE SEED AND SENTINEL LYMPH NODE BIOPSY;  Surgeon: Erroll Luna, MD;  Location: [facility];  Service: General;  Laterality: Right;   COLONOSCOPY     TONSILLECTOMY     age 46     OB History     Gravida  1   Para  0   Term      Preterm      AB      Living  0      SAB      IAB      Ectopic      Multiple      Live Births              Family History  Problem Relation Age of Onset   Colon cancer Mother 33   Hypertension Mother    Kidney disease Mother    Diabetes Mother    Lung cancer Father        Died  at 61 from lung cancer   Heart disease Father        heart attack   Hypertension Sister    Diabetes Sister    Colon polyps Sister    Coronary artery disease Brother 74       died of "massive heart attack"   Diabetes Brother    Ovarian cancer Paternal Grandmother    Breast cancer Maternal Aunt        dx in her 52s   Colon cancer Cousin        maternal 1st cousin dx over 49   Breast cancer Cousin        mat 1st cousin dx in her 38s   Esophageal cancer Neg Hx    Stomach cancer Neg Hx    Rectal cancer Neg Hx    Pancreatic cancer Neg Hx    Liver disease Neg Hx     Social History   Tobacco Use   Smoking status: Never   Smokeless tobacco: Never  Vaping Use   Vaping Use: Never used  Substance Use Topics   Alcohol use: No   Drug use: No    Home Medications Prior to Admission medications   Medication Sig Start Date End Date Taking? Authorizing Provider  amoxicillin (AMOXIL) 500 MG  capsule Take 1 capsule (500 mg total) by mouth 3 (three) times daily. 12/16/20  Yes Isla Pence, MD  HYDROcodone-acetaminophen (NORCO/VICODIN) 5-325 MG tablet Take 1 tablet by mouth every 4 (four) hours as needed. 12/16/20  Yes Isla Pence, MD  Cholecalciferol (VITAMIN D3) 1000 units CAPS Take 1 capsule by mouth daily.    [provider]  cloNIDine (CATAPRES) 0.1 MG tablet Take 0.1 mg by mouth at bedtime. 09/29/18   [provider]  DULoxetine (CYMBALTA) 60 MG capsule Take 60 mg by mouth daily.  06/22/17   [provider]  hydroquinone 4 % cream Apply topically. 12/28/19   [provider]  Multiple Vitamins-Minerals (ONE-A-DAY WOMENS 50 PLUS PO) Take 1 tablet by mouth daily.    [provider]  pantoprazole (PROTONIX) 40 MG tablet Take 1 tablet (40 mg total) by mouth daily. 03/29/18   Irene Shipper, MD  rosuvastatin (CRESTOR) 20 MG tablet Take 20 mg by mouth daily. 12/06/18   [provider]  triamterene-hydrochlorothiazide (DYAZIDE) 37.5-25 MG capsule Take by mouth.    [provider]  [facility] SR 150 MG 12 hr tablet Take 150 mg by mouth daily. 06/29/19   [provider]    Allergies    Otho Darner allergy], Sulfa antibiotics, Lipitor [atorvastatin], and Sulfa drugs cross reactors  Review of Systems   Review of Systems  HENT:  Positive for sore throat.   All other systems reviewed and are negative.  Physical Exam Updated Vital Signs BP (!) 142/61 (BP Location: Left Arm)   Pulse 82   Temp 98.6 F (37 C) (Oral)   Resp 20   Ht _0  (1.6 m)   Wt 96.6 kg   LMP 03/17/1983   SpO2 100%   BMI 37.73 kg/m   Physical Exam Vitals and nursing note reviewed.  Constitutional:      Appearance: She is well-developed.  HENT:     Head: Normocephalic and atraumatic.     Right Ear: Tympanic membrane and ear canal normal.     Left Ear: Tympanic membrane and ear canal normal.     Mouth/Throat:     Pharynx: Posterior  oropharyngeal erythema present.  Eyes:  Conjunctiva/sclera: Conjunctivae normal.     Pupils: Pupils are equal, round, and reactive to light.  Cardiovascular:     Rate and Rhythm: Normal rate and regular rhythm.     Heart sounds: Normal heart sounds.  Pulmonary:     Effort: Pulmonary effort is normal.     Breath sounds: Normal breath sounds.  Abdominal:     General: Bowel sounds are normal.     Palpations: Abdomen is soft.  Musculoskeletal:     Cervical back: Normal range of motion and neck supple.  Skin:    General: Skin is warm.     Capillary Refill: Capillary refill takes less than 2 seconds.  Neurological:     General: No focal deficit present.     Mental Status: She is alert and oriented to person, place, and time.  Psychiatric:        Mood and Affect: Mood normal.        Behavior: Behavior normal.    ED Results / Procedures / Treatments   Labs (all labs ordered are listed, but only abnormal results are displayed) Labs Reviewed  GROUP A STREP BY PCR  RESP PANEL BY RT-PCR (FLU A&B, COVID) [id number]    EKG None  Radiology No results found.  Procedures Procedures   Medications Ordered in ED Medications  amoxicillin (AMOXIL) capsule 500 mg (has no administration in time range)  lidocaine (XYLOCAINE) 2 % viscous mouth solution 15 mL (15 mLs Mouth/Throat Given 12/16/20 1007)    ED Course  I have reviewed the triage vital signs and the nursing notes.  Pertinent labs & imaging results that were available during my care of the patient were reviewed by me and considered in my medical decision making (see chart for details).    MDM Rules/Calculators/A&P                           Strep and Covid negative.  Pt is able to swallow the lidocaine and said her throat feels a little better.  Pt does have a lot of erythema in her pharynx, so she is treated with amox and a short course of lortab.  Return if worse.  F/u with pcp. Final Clinical Impression(s) / ED  Diagnoses Final diagnoses:  Strep pharyngitis    Rx / DC Orders ED Discharge Orders          Ordered    amoxicillin (AMOXIL) 500 MG capsule  3 times daily        12/16/20 1056    HYDROcodone-acetaminophen (NORCO/VICODIN) 5-325 MG tablet  Every 4 hours PRN        12/16/20 1056             Isla Pence, MD 12/16/20 1059

## 2020-12-17 ENCOUNTER — Encounter (HOSPITAL_BASED_OUTPATIENT_CLINIC_OR_DEPARTMENT_OTHER): Payer: Self-pay | Admitting: Emergency Medicine

## 2020-12-17 ENCOUNTER — Emergency Department (HOSPITAL_BASED_OUTPATIENT_CLINIC_OR_DEPARTMENT_OTHER)
Admission: EM | Admit: 2020-12-17 | Discharge: 2020-12-17 | Disposition: A | Discharge status: Home / Self Care | Payer: Medicare HMO | Attending: Emergency Medicine | Admitting: Emergency Medicine

## 2020-12-17 DIAGNOSIS — R1310 Dysphagia, unspecified: Secondary | ICD-10-CM | POA: Diagnosis not present

## 2020-12-17 DIAGNOSIS — J029 Acute pharyngitis, unspecified: Secondary | ICD-10-CM | POA: Diagnosis not present

## 2020-12-17 DIAGNOSIS — Z79899 Other long term (current) drug therapy: Secondary | ICD-10-CM | POA: Diagnosis not present

## 2020-12-17 DIAGNOSIS — I10 Essential (primary) hypertension: Secondary | ICD-10-CM | POA: Insufficient documentation

## 2020-12-17 DIAGNOSIS — Z853 Personal history of malignant neoplasm of breast: Secondary | ICD-10-CM | POA: Insufficient documentation

## 2020-12-17 MED ORDER — LIDOCAINE VISCOUS HCL 2 % MT SOLN
15.0000 mL | Freq: Once | OROMUCOSAL | Status: AC
  Administered 2020-12-17: 15 mL via ORAL
  Filled 2020-12-17: qty 15

## 2020-12-17 MED ORDER — ALUM & MAG HYDROXIDE-SIMETH 200-200-20 MG/5ML PO SUSP
30.0000 mL | Freq: Once | ORAL | Status: AC
  Administered 2020-12-17: 30 mL via ORAL
  Filled 2020-12-17: qty 30

## 2020-12-17 MED ORDER — PREDNISONE 10 MG PO TABS: 20.0000 mg | ORAL_TABLET | Freq: Two times a day (BID) | ORAL | 0 refills | Status: DC

## 2020-12-17 MED ORDER — DEXAMETHASONE SODIUM PHOSPHATE 10 MG/ML IJ SOLN
10.0000 mg | Freq: Once | INTRAMUSCULAR | Status: AC
  Administered 2020-12-17: 10 mg via INTRAMUSCULAR
  Filled 2020-12-17: qty 1

## 2020-12-17 NOTE — ED Triage Notes (Signed)
Pt is c/o sore throat for 3-4 days  Pt was seen here on Oct 3rd for same but states it has gotten worse  Pt states it is painful to swallow

## 2020-12-17 NOTE — ED Provider Notes (Signed)
[facility] Provider Note   CSN: [id number] Arrival date & time: 12/17/20  0331     History Chief Complaint  Patient presents with   Sore Throat    Katelyn Porter is a 70 y.o. female.  Patient is a 70 year old female with past medical history of hypertension, hyperlipidemia, breast cancer.  Patient presenting today with complaints of sore throat.  She was seen less than 24 hours ago with similar complaints.  Both strep and COVID test were negative, but patient treated with amoxicillin and hydrocodone.  She returns stating that her throat is more sore and is having difficulty swallowing due to the pain.  She arrives here with temp of 100.4.  The history is provided by the patient.  Sore Throat This is a new problem. The current episode started 2 days ago. The problem occurs constantly. The problem has been gradually worsening. The symptoms are aggravated by swallowing. Nothing relieves the symptoms. Treatments tried: Amoxicillin and Lortab. The treatment provided no relief.      Past Medical History:  Diagnosis Date   Breast cancer ([place]) 2008   left, with radiation finished 08, tamoxifen   Breast cancer ([place]) 2019   Rt. had lymph node removed  having radiation currently   Complication of anesthesia    woke up with colonoscopy and hysterectomy   Depression 01/24/2013   Family history of breast cancer    Family history of colon cancer    GERD (gastroesophageal reflux disease)    Hyperlipidemia    Hypertension    IBS (irritable bowel syndrome)    Palpitations    Personal history of radiation therapy 2007   left breast   Personal history of radiation therapy 2019   right breast   Vertigo     Patient Active Problem List   Diagnosis Date Noted   Breast cancer screening, high risk patient 01/18/2020   Tinnitus 92/42/6834   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   Hot flashes related to aromatase inhibitor therapy 04/21/2018   Family  history of ovarian cancer 07/21/2017   Genetic testing 07/13/2017   Family history of breast cancer    Family history of colon cancer    Malignant neoplasm of upper-outer quadrant of right breast in female, estrogen receptor positive ([place]) 06/29/2017   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, left ([place]) 06/29/2017   Hyperlipidemia 07/23/2016   Essential (primary) hypertension 05/11/2014   Gastro-esophageal reflux disease without esophagitis 01/24/2013   Recurrent major depressive disorder, in partial remission ([place]) 01/24/2013   Routine general medical examination at a health care facility 06/22/2012   Other malaise and fatigue 05/10/2012   Unspecified constipation 05/10/2012    Past Surgical History:  Procedure Laterality Date   ABDOMINAL HYSTERECTOMY  1985   TAH (ovaries remain)   BREAST LUMPECTOMY Left 2007   BREAST LUMPECTOMY Right 2019   BREAST LUMPECTOMY WITH RADIOACTIVE SEED AND SENTINEL LYMPH NODE BIOPSY Right 07/22/2017   Procedure: BREAST LUMPECTOMY WITH RADIOACTIVE SEED AND SENTINEL LYMPH NODE BIOPSY;  Surgeon: Erroll Luna, MD;  Location: [facility];  Service: General;  Laterality: Right;   COLONOSCOPY     TONSILLECTOMY     age 89     OB History     Gravida  1   Para  0   Term      Preterm      AB      Living  0      SAB  IAB      Ectopic      Multiple      Live Births              Family History  Problem Relation Age of Onset   Colon cancer Mother 4   Hypertension Mother    Kidney disease Mother    Diabetes Mother    Lung cancer Father        Died at 55 from lung cancer   Heart disease Father        heart attack   Hypertension Sister    Diabetes Sister    Colon polyps Sister    Coronary artery disease Brother 38       died of "massive heart attack"   Diabetes Brother    Ovarian cancer Paternal Grandmother    Breast cancer Maternal Aunt        dx in her 40s   Colon cancer Cousin         maternal 1st cousin dx over 38   Breast cancer Cousin        mat 1st cousin dx in her 84s   Esophageal cancer Neg Hx    Stomach cancer Neg Hx    Rectal cancer Neg Hx    Pancreatic cancer Neg Hx    Liver disease Neg Hx     Social History   Tobacco Use   Smoking status: Never   Smokeless tobacco: Never  Vaping Use   Vaping Use: Never used  Substance Use Topics   Alcohol use: No   Drug use: No    Home Medications Prior to Admission medications   Medication Sig Start Date End Date Taking? Authorizing Provider  amoxicillin (AMOXIL) 500 MG capsule Take 1 capsule (500 mg total) by mouth 3 (three) times daily. 12/16/20   Isla Pence, MD  Cholecalciferol (VITAMIN D3) 1000 units CAPS Take 1 capsule by mouth daily.    [provider]  cloNIDine (CATAPRES) 0.1 MG tablet Take 0.1 mg by mouth at bedtime. 09/29/18   [provider]  DULoxetine (CYMBALTA) 60 MG capsule Take 60 mg by mouth daily.  06/22/17   [provider]  HYDROcodone-acetaminophen (NORCO/VICODIN) 5-325 MG tablet Take 1 tablet by mouth every 4 (four) hours as needed. 12/16/20   Isla Pence, MD  hydroquinone 4 % cream Apply topically. 12/28/19   [provider]  Multiple Vitamins-Minerals (ONE-A-DAY WOMENS 50 PLUS PO) Take 1 tablet by mouth daily.    [provider]  pantoprazole (PROTONIX) 40 MG tablet Take 1 tablet (40 mg total) by mouth daily. 03/29/18   Irene Shipper, MD  rosuvastatin (CRESTOR) 20 MG tablet Take 20 mg by mouth daily. 12/06/18   [provider]  triamterene-hydrochlorothiazide (DYAZIDE) 37.5-25 MG capsule Take by mouth.    [provider]  [facility] SR 150 MG 12 hr tablet Take 150 mg by mouth daily. 06/29/19   [provider]    Allergies    Otho Darner allergy], Sulfa antibiotics, Lipitor [atorvastatin], and Sulfa drugs cross reactors  Review of Systems   Review of Systems  All other systems reviewed and are  negative.  Physical Exam Updated Vital Signs BP 134/65 (BP Location: Left Arm)   Pulse 94   Temp (!) 100.4 F (38 C) (Oral)   Resp 16   Ht '5\' 3"'  (1.6 m)   Wt 96.6 kg   LMP 03/17/1983   SpO2 98%   BMI 37.73 kg/m   Physical Exam  Vitals and nursing note reviewed.  Constitutional:      General: She is not in acute distress.    Appearance: She is well-developed. She is not diaphoretic.  HENT:     Head: Normocephalic and atraumatic.     Mouth/Throat:     Mouth: Mucous membranes are moist.     Pharynx: Pharyngeal swelling and posterior oropharyngeal erythema present.     Comments: There is mild to moderate swelling of the posterior oropharynx along with erythema.  There are no exudates.  There is no tonsillar deviation or evidence for abscess on exam. Cardiovascular:     Rate and Rhythm: Normal rate and regular rhythm.     Heart sounds: No murmur heard.   No friction rub. No gallop.  Pulmonary:     Effort: Pulmonary effort is normal. No respiratory distress.     Breath sounds: Normal breath sounds. No wheezing.  Abdominal:     General: Bowel sounds are normal. There is no distension.     Palpations: Abdomen is soft.     Tenderness: There is no abdominal tenderness.  Musculoskeletal:        General: Normal range of motion.     Cervical back: Normal range of motion and neck supple.  Skin:    General: Skin is warm and dry.  Neurological:     General: No focal deficit present.     Mental Status: She is alert and oriented to person, place, and time.    ED Results / Procedures / Treatments   Labs (all labs ordered are listed, but only abnormal results are displayed) Labs Reviewed - No data to display  EKG None  Radiology No results found.  Procedures Procedures   Medications Ordered in ED Medications  dexamethasone (DECADRON) injection 10 mg (has no administration in time range)  alum & mag hydroxide-simeth (MAALOX/MYLANTA) 200-200-20 MG/5ML suspension 30 mL (has  no administration in time range)    And  lidocaine (XYLOCAINE) 2 % viscous mouth solution 15 mL (has no administration in time range)    ED Course  I have reviewed the triage vital signs and the nursing notes.  Pertinent labs & imaging results that were available during my care of the patient were reviewed by me and considered in my medical decision making (see chart for details).    MDM Rules/Calculators/A&P  Patient presents for the second time in the last 24 hours with sore throat.  She describes difficulty speaking and swallowing due to the discomfort.  I see nothing on exam that appears to be an abscess.  She has some erythema and swelling to the posterior oropharynx, but I see no exudates.  Her strep test and COVID test from earlier today were negative and she was started on amoxicillin.  Patient was given IM Decadron along with GI cocktail and throat is feeling significantly improved.  At this point patient is now speaking more clearly and states that she feels much better.  She will be discharged with prednisone in addition to medications already prescribed.  Final Clinical Impression(s) / ED Diagnoses Final diagnoses:  None    Rx / DC Orders ED Discharge Orders     None        Veryl Speak, MD 12/17/20 [phone]

## 2020-12-17 NOTE — Discharge Instructions (Addendum)
Begin taking prednisone as prescribed.  Continue other medications as previously prescribed.    Follow-up with your primary doctor if not improving in the next few days, and return to the ER if you develop difficulty breathing, and inability to swallow, or other new and concerning symptoms.

## 2020-12-20 ENCOUNTER — Encounter: Payer: Self-pay | Admitting: Internal Medicine

## 2020-12-20 ENCOUNTER — Telehealth: Payer: Self-pay | Admitting: Internal Medicine

## 2020-12-20 NOTE — Telephone Encounter (Signed)
Inbound call from pt requesting a call back stating that she needed liquid for her prep for her procedure that's coming up. She stated that she is having trouble swallowing the tablets. Please advise. Thank you

## 2020-12-23 NOTE — Telephone Encounter (Signed)
Spoke to patient and told her I would redo instructions for Plenvu and leave them up front along with a Plenvu sample for her to pick up.  Patient agreed.

## 2020-12-26 ENCOUNTER — Encounter: Payer: Self-pay | Admitting: Internal Medicine

## 2020-12-26 ENCOUNTER — Ambulatory Visit (AMBULATORY_SURGERY_CENTER): Payer: Medicare HMO | Admitting: Internal Medicine

## 2020-12-26 ENCOUNTER — Other Ambulatory Visit: Payer: Self-pay

## 2020-12-26 VITALS — BP 137/89 | HR 65 | Temp 97.5°F | Resp 12 | Ht 62.0 in | Wt 213.0 lb

## 2020-12-26 DIAGNOSIS — Z538 Procedure and treatment not carried out for other reasons: Secondary | ICD-10-CM | POA: Diagnosis not present

## 2020-12-26 DIAGNOSIS — Z86010 Personal history of colonic polyps: Secondary | ICD-10-CM | POA: Diagnosis not present

## 2020-12-26 DIAGNOSIS — K219 Gastro-esophageal reflux disease without esophagitis: Secondary | ICD-10-CM | POA: Diagnosis not present

## 2020-12-26 DIAGNOSIS — R1013 Epigastric pain: Secondary | ICD-10-CM

## 2020-12-26 DIAGNOSIS — K317 Polyp of stomach and duodenum: Secondary | ICD-10-CM

## 2020-12-26 DIAGNOSIS — K5732 Diverticulitis of large intestine without perforation or abscess without bleeding: Secondary | ICD-10-CM | POA: Diagnosis not present

## 2020-12-26 DIAGNOSIS — R140 Abdominal distension (gaseous): Secondary | ICD-10-CM

## 2020-12-26 DIAGNOSIS — K2100 Gastro-esophageal reflux disease with esophagitis, without bleeding: Secondary | ICD-10-CM

## 2020-12-26 MED ORDER — SODIUM CHLORIDE 0.9 % IV SOLN: 500.0000 mL | Freq: Once | INTRAVENOUS | Status: DC

## 2020-12-26 NOTE — Patient Instructions (Signed)
Resume previous diet Dr. Henrene Pastor is encouraging eating smaller portions and losing weight Continue current medications Repeat colonoscopy in 6 months with extensive prep  YOU HAD AN ENDOSCOPIC PROCEDURE TODAY AT [facility]:   Refer to the procedure report that was given to you for any specific questions about what was found during the examination.  If the procedure report does not answer your questions, please call your gastroenterologist to clarify.  If you requested that your care partner not be given the details of your procedure findings, then the procedure report has been included in a sealed envelope for you to review at your convenience later.  YOU SHOULD EXPECT: Some feelings of bloating in the abdomen. Passage of more gas than usual.  Walking can help get rid of the air that was put into your GI tract during the procedure and reduce the bloating. If you had a lower endoscopy (such as a colonoscopy or flexible sigmoidoscopy) you may notice spotting of blood in your stool or on the toilet paper. If you underwent a bowel prep for your procedure, you may not have a normal bowel movement for a few days.  Please Note:  You might notice some irritation and congestion in your nose or some drainage.  This is from the oxygen used during your procedure.  There is no need for concern and it should clear up in a day or so.  SYMPTOMS TO REPORT IMMEDIATELY:  Following lower endoscopy (colonoscopy or flexible sigmoidoscopy):  Excessive amounts of blood in the stool  Significant tenderness or worsening of abdominal pains  Swelling of the abdomen that is new, acute  Fever of 100F or higher  Following upper endoscopy (EGD)  Vomiting of blood or coffee ground material  New chest pain or pain under the shoulder blades  Painful or persistently difficult swallowing  New shortness of breath  Fever of 100F or higher  Black, tarry-looking stools  For urgent or emergent issues, a  gastroenterologist can be reached at any hour by calling [phone]. Do not use MyChart messaging for urgent concerns.   DIET:  We do recommend a small meal at first, but then you may proceed to your regular diet.  Drink plenty of fluids but you should avoid alcoholic beverages for 24 hours.  ACTIVITY:  You should plan to take it easy for the rest of today and you should NOT DRIVE or use heavy machinery until tomorrow (because of the sedation medicines used during the test).    FOLLOW UP: Our staff will call the number listed on your records 48-72 hours following your procedure to check on you and address any questions or concerns that you may have regarding the information given to you following your procedure. If we do not reach you, we will leave a message.  We will attempt to reach you two times.  During this call, we will ask if you have developed any symptoms of COVID 19. If you develop any symptoms (ie: fever, flu-like symptoms, shortness of breath, cough etc.) before then, please call [phone].  If you test positive for Covid 19 in the 2 weeks post procedure, please call and report this information to [place].    If any biopsies were taken you will be contacted by phone or by letter within the next 1-3 weeks.  Please call us at [phone] if you have not heard about the biopsies in 3 weeks.   SIGNATURES/CONFIDENTIALITY: You and/or your care partner have signed paperwork which  will be entered into your electronic medical record.  These signatures attest to the fact that that the information above on your After Visit Summary has been reviewed and is understood.  Full responsibility of the confidentiality of this discharge information lies with you and/or your care-partner.

## 2020-12-26 NOTE — Op Note (Signed)
[facility] Patient Name: Katelyn Porter Procedure Date: 12/26/2020 8:38 AM MRN: [id number] Endoscopist: Docia Chuck. Henrene Pastor , MD Age: 70 Referring MD:  Date of Birth: [birth year] Gender: Female Account #: [id number] Procedure:                Upper GI endoscopy Indications:              Esophageal reflux, Abdominal bloating, abdominal                            discomfort Medicines:                Monitored Anesthesia Care Procedure:                Pre-Anesthesia Assessment:                           - Prior to the procedure, a History and Physical                            was performed, and patient medications and                            allergies were reviewed. The patient's tolerance of                            previous anesthesia was also reviewed. The risks                            and benefits of the procedure and the sedation                            options and risks were discussed with the patient.                            All questions were answered, and informed consent                            was obtained. Prior Anticoagulants: The patient has                            taken no previous anticoagulant or antiplatelet                            agents. After reviewing the risks and benefits, the                            patient was deemed in satisfactory condition to                            undergo the procedure.                           After obtaining informed consent, the endoscope was  passed under direct vision. Throughout the                            procedure, the patient's blood pressure, pulse, and                            oxygen saturations were monitored continuously. The                            Endoscope was introduced through the mouth, and                            advanced to the second part of duodenum. The upper                            GI endoscopy was accomplished without difficulty.                             The patient tolerated the procedure well. Scope In: Scope Out: Findings:                 The esophagus was normal.                           The stomach revealed mucosal changes of retching                            gastropathy (vomited with prep) in the proximal                            portion. Several diminutive fundic gland type                            polyps noted. Stomach was otherwise normal.                           The examined duodenum was normal. The major ampulla                            was visualized.                           The cardia and gastric fundus were normal on                            retroflexion. Complications:            No immediate complications. Estimated Blood Loss:     Estimated blood loss: none. Impression:               1. Unremarkable EGD. Recommendation:           - Patient has a contact number available for                            emergencies. The signs and symptoms of potential  delayed complications were discussed with the                            patient. Return to normal activities tomorrow.                            Written discharge instructions were provided to the                            patient.                           - Resume previous diet.                           - Continue present medications.                           - Recommend eating smaller portions                           - Recommend weight loss. This will help your                            abdominal symptoms Pacer Dorn N. Henrene Pastor, MD 12/26/2020 8:55:31 AM This report has been signed electronically.

## 2020-12-26 NOTE — Progress Notes (Signed)
Pt in recovery with monitors in place, VSS. Report given to receiving RN. Bite guard was placed with pt awake to ensure comfort. No dental or soft tissue damage noted. 

## 2020-12-26 NOTE — Progress Notes (Signed)
HISTORY OF PRESENT ILLNESS:  Katelyn Porter is a 70 y.o. female presents today for colonoscopy and upper endoscopy.  She was fully evaluated in the office October 25, 2020.  See that complete history and physical examination.  There have been no interval changes.  The indications for the procedures are colon polyp surveillance and GERD/epigastric discomfort with bloating.  She had a little difficulty with the second portion of her prep but otherwise did well.  REVIEW OF SYSTEMS:  All non-GI ROS negative. Past Medical History:  Diagnosis Date   Breast cancer ([place]) 2008   left, with radiation finished 08, tamoxifen   Breast cancer ([place]) 2019   Rt. had lymph node removed  having radiation currently   Complication of anesthesia    woke up with colonoscopy and hysterectomy   Depression 01/24/2013   Family history of breast cancer    Family history of colon cancer    GERD (gastroesophageal reflux disease)    Hyperlipidemia    Hypertension    IBS (irritable bowel syndrome)    Palpitations    Personal history of radiation therapy 2007   left breast   Personal history of radiation therapy 2019   right breast   Vertigo     Past Surgical History:  Procedure Laterality Date   ABDOMINAL HYSTERECTOMY  1985   TAH (ovaries remain)   BREAST LUMPECTOMY Left 2007   BREAST LUMPECTOMY Right 2019   BREAST LUMPECTOMY WITH RADIOACTIVE SEED AND SENTINEL LYMPH NODE BIOPSY Right 07/22/2017   Procedure: BREAST LUMPECTOMY WITH RADIOACTIVE SEED AND SENTINEL LYMPH NODE BIOPSY;  Surgeon: Erroll Luna, MD;  Location: [facility];  Service: General;  Laterality: Right;   COLONOSCOPY     TONSILLECTOMY     age 13    Social History Katelyn Porter  reports that she has never smoked. She has never used smokeless tobacco. She reports that she does not drink alcohol and does not use drugs.  family history includes Breast cancer in her cousin and maternal aunt; Colon cancer in her cousin;  Colon cancer (age of onset: 86) in her mother; Colon polyps in her sister; Coronary artery disease (age of onset: 16) in her brother; Diabetes in her brother, mother, and sister; Heart disease in her father; Hypertension in her mother and sister; Kidney disease in her mother; Lung cancer in her father; Ovarian cancer in her paternal grandmother.  Allergies  Allergen Reactions   Crab [Shellfish Allergy] Swelling    Blisters on tongue.   Sulfa Antibiotics Other (See Comments)    headache   Lipitor [Atorvastatin]     Muscle cramping   Sulfa Drugs Cross Reactors        PHYSICAL EXAMINATION:  Vital signs: BP (!) 141/65   Pulse 79   Temp (!) 97.5 F (36.4 C) (Skin)   Resp 10   Ht 5\' 2"  (1.575 m)   Wt 213 lb (96.6 kg)   LMP 03/17/1983   SpO2 100%   BMI 38.96 kg/m  General: Well-developed, well-nourished, no acute distress HEENT: Sclerae are anicteric, conjunctiva pink. Oral mucosa intact Lungs: Clear Heart: Regular Abdomen: soft, nontender, nondistended, no obvious ascites, no peritoneal signs, normal bowel sounds. No organomegaly. Extremities: No edema Psychiatric: alert and oriented x3. Cooperative     ASSESSMENT:  1.  GERD with chronic bloating and abdominal discomfort. 2.  History of adenomatous colon polyps.  Due for surveillance   PLAN:  1.  Upper endoscopy 2.  Colonoscopy

## 2020-12-26 NOTE — Progress Notes (Signed)
Pt's states no medical or surgical changes since previsit or office visit. VS assessed by C.W 

## 2020-12-26 NOTE — Op Note (Signed)
[facility] Patient Name: Katelyn Porter Procedure Date: 12/26/2020 7:57 AM MRN: [id number] Endoscopist: Docia Chuck. Henrene Pastor , MD Age: 70 Referring MD:  Date of Birth: Dec 02, 1950 Gender: Female Account #: [id number] Procedure:                Colonoscopy Indications:              High risk colon cancer surveillance: Personal                            history of adenoma (10 mm or greater in size), High                            risk colon cancer surveillance: Personal history of                            multiple (3 or more) adenomas. Last examination                            2019. Also family history of colon cancer in mother                            greater than 35 and niece Medicines:                Monitored Anesthesia Care Procedure:                Pre-Anesthesia Assessment:                           - Prior to the procedure, a History and Physical                            was performed, and patient medications and                            allergies were reviewed. The patient's tolerance of                            previous anesthesia was also reviewed. The risks                            and benefits of the procedure and the sedation                            options and risks were discussed with the patient.                            All questions were answered, and informed consent                            was obtained. Prior Anticoagulants: The patient has                            taken no previous anticoagulant or antiplatelet  agents. After reviewing the risks and benefits, the                            patient was deemed in satisfactory condition to                            undergo the procedure.                           After obtaining informed consent, the colonoscope                            was passed under direct vision. Throughout the                            procedure, the patient's blood pressure, pulse,  and                            oxygen saturations were monitored continuously. The                            CF [id number] #[id number] was introduced through the anus                            and advanced to the the distal transverse colon.                            The procedure was aborted due to poor preparation.                            No anatomical landmarks were photographed. The                            quality of the bowel preparation was poor. The                            colonoscopy was performed without difficulty. The                            patient tolerated the procedure well. The bowel                            preparation used was Prepopik via split dose                            instruction. Scope In: 8:33:44 AM Scope Out: 8:36:21 AM Total Procedure Duration: 0 hours 2 minutes 37 seconds  Findings:                 Multiple diverticula were found in the sigmoid                            colon. The preparation was poor. Due to poor  preparation the procedure was aborted at the level                            of the distal transverse colon. Complications:            No immediate complications. Estimated blood loss:                            None. Estimated Blood Loss:     Estimated blood loss: none. Impression:               - Preparation of the colon was poor.                           - Diverticulosis in the sigmoid colon.                           -Incomplete examination due to poor preparation. Recommendation:           - Repeat colonoscopy within the next 6 months for                            surveillance. She will need MORE EXTENSIVE                            PREP?"recommend bottle of magnesium citrate the day                            prior to the examination followed by standard                            Suprep or Sutab prep.                           - Patient has a contact number available for                             emergencies. The signs and symptoms of potential                            delayed complications were discussed with the                            patient. Return to normal activities tomorrow.                            Written discharge instructions were provided to the                            patient.                           - Resume previous diet.                           - Continue present medications. Docia Chuck. Henrene Pastor, MD 12/26/2020 8:48:16 AM This  report has been signed electronically.

## 2020-12-30 ENCOUNTER — Telehealth: Payer: Self-pay

## 2020-12-30 ENCOUNTER — Telehealth: Payer: Self-pay | Admitting: *Deleted

## 2020-12-30 NOTE — Telephone Encounter (Signed)
Attempted f/u call back. No answer, left VM. 

## 2020-12-30 NOTE — Telephone Encounter (Signed)
2nd follow call attempt.   No answer.

## 2021-01-17 ENCOUNTER — Other Ambulatory Visit: Payer: Self-pay | Admitting: *Deleted

## 2021-01-17 DIAGNOSIS — C50411 Malignant neoplasm of upper-outer quadrant of right female breast: Secondary | ICD-10-CM

## 2021-01-17 DIAGNOSIS — Z170 Estrogen receptor positive status [ER+]: Secondary | ICD-10-CM

## 2021-01-19 NOTE — Progress Notes (Signed)
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   She continues on anastrozole.  Hot flashes and vaginal dryness are not major issues for her at this point  She underwent baseline bone density screening [facility] on [facility]20/2019. This showed a T-score of +1.9, which is normal.  Since her last visit, she underwent bilateral diagnostic mammography with tomography at [facility] on 07/17/2020 showing: breast density category C; no evidence of malignancy in either breast.  She was supposed to have had breast MRI October 2022 but for some reason this was not scheduled.  She herself is not sure whether she really wants to proceed with that or not at this point  She is being followed by Dr. Sabra Heck for a simple ovarian cyst. Most recent pelvic ultrasound on 10/07/2020 showed continued stability to left ovarian cyst measuring up to 3.6 cm.  Of note, she also underwent colonoscopy and upper endoscopy on 12/26/2020 under Dr. Henrene Pastor. Endoscopy was unremarkable. Preparation of the colon was poor, and repeat was recommended in 6 months.   REVIEW OF SYSTEMS: Katelyn Porter had a significant problem with sore throat in October.  She saw ENT and the emergency  room.  She is still on antibiotics for that.  She also had course of Deltasone.  She tells me she has been off the Deltasone for 2 weeks now so that does not explain her most recent blood glucose level.  She had a wonderful peach cobbler this morning for breakfast and she also has frosted flakes for breakfast.  We discussed the fact that there is quite a bit of diabetes in her family.  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  She was treated with surgery radiation and tamoxifen for 5 years and we released her from follow-up in 2013.  More recently she had routine screening mammography, on 06/16/2017 showing a possible abnormality in the right breast. She underwent unilateral right breast diagnostic mammography with tomography and right breast ultrasonography at [facility] on 06/21/2017 showing breast density category B. Suspicious new mass in the right breast 9 o'clock central and 4 cm from the nipple measuring 0.8 x 0.6 x 0.7 cm. There was no axillary adenopathy sonographically.   Accordingly on 06/22/2017 she proceeded to biopsy of the right breast area in question. The pathology from this procedure showed ([id number]): Breast, right, needle core biopsy, 9:15 o'clock, UOQ with invasive ductal carcinoma with extracellular mucin, grade 2. Prognostic indicators significant for: estrogen receptor, 100% positive and progesterone receptor, 5% positive, both with strong staining intensity. Proliferation marker Ki67 at 20%. HER2 negative.  The patient's subsequent history is as detailed  below.   PAST MEDICAL HISTORY: Past Medical History:  Diagnosis Date   Breast cancer ([place]) 2008   left, with radiation finished 08, tamoxifen   Breast cancer ([facility]) 2019   Rt. had lymph  node removed  having radiation currently   Complication of anesthesia    woke up with colonoscopy and hysterectomy   Depression 01/24/2013   Family history of breast cancer    Family history of colon cancer    GERD (gastroesophageal reflux disease)    Hyperlipidemia    Hypertension    IBS (irritable bowel syndrome)    Palpitations    Personal history of radiation therapy 2007   left breast   Personal history of radiation therapy 2019   right breast   Vertigo     PAST SURGICAL HISTORY: Past Surgical History:  Procedure Laterality Date   ABDOMINAL HYSTERECTOMY  1985   TAH (ovaries remain)   BREAST LUMPECTOMY Left 2007   BREAST LUMPECTOMY Right 2019   BREAST LUMPECTOMY WITH RADIOACTIVE SEED AND SENTINEL LYMPH NODE BIOPSY Right 07/22/2017   Procedure: BREAST LUMPECTOMY WITH RADIOACTIVE SEED AND SENTINEL LYMPH NODE BIOPSY;  Surgeon: Erroll Luna, MD;  Location: [facility];  Service: General;  Laterality: Right;   COLONOSCOPY     TONSILLECTOMY     age 60    FAMILY HISTORY Family History  Problem Relation Age of Onset   Colon cancer Mother 70   Hypertension Mother    Kidney disease Mother    Diabetes Mother    Lung cancer Father        Died at 20 from lung cancer   Heart disease Father        heart attack   Hypertension Sister    Diabetes Sister    Colon polyps Sister    Coronary artery disease Brother 30       died of "massive heart attack"   Diabetes Brother    Ovarian cancer Paternal Grandmother    Breast cancer Maternal Aunt        dx in her 14s   Colon cancer Cousin        maternal 1st cousin dx over 60   Breast cancer Cousin        mat 1st cousin dx in her 65s   Esophageal cancer Neg Hx    Stomach cancer Neg Hx    Rectal cancer Neg Hx    Pancreatic cancer Neg Hx    Liver disease Neg Hx   The patient's father had a history of tongue cancer which apparently spread to his lungs according to the patient. He passed due to a MI at age 37. The  patient's mother died at age 25 due to renal failure and diabetes.  She also had a history of colon cancer. The patient had 1 brother who died from an MI; and 1 sister.  There was a maternal aunt diagnosed with breast cancer in the 22's. A maternal cousin passed due to breast cancer. Another maternal cousin had colon cancer. She denies a family history of ovarian cancer.    GYNECOLOGIC HISTORY:  Patient's last menstrual period was 03/17/1983. Menarche: 70 years old. She is GXP0. Her LMP was age 53 after her hysterectomy without BSO. She took HRT until 2008, discontinued at the time of her initial breast cancer diagnosis.    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 Her husband, Katelyn Porter, worked in [employer]  industry before retiring. He passed away 02-10-2018 from a hemorraghic stroke. He had 2 children from a prior marriage, a daughter Katelyn Porter, who lives in [place] and works for [employer], and a son, Katelyn Porter, who works for the [employer] in [place], [place]. The patient has 3 step-grandchildren, 1 of whom is now a Designer, jewellery.. She now lives alone. She attends [employer].     ADVANCED DIRECTIVES: Not in place.  At the 01/18/2020 visit the patient was given the appropriate documents to complete and notarized at her discretion.   HEALTH MAINTENANCE: Social History   Tobacco Use   Smoking status: Never   Smokeless tobacco: Never  Vaping Use   Vaping Use: Never used  Substance Use Topics   Alcohol use: No   Drug use: No     Colonoscopy: UTD/Dr. Perry/ normal  PAP: s/p TAHBSO  Bone density: 02/2019, +1.9   Allergies  Allergen Reactions   Crab [Shellfish Allergy] Swelling    Blisters on tongue.   Sulfa Antibiotics Other (See Comments)    headache   Lipitor [Atorvastatin]     Muscle cramping   Sulfa Drugs Cross Reactors     Current Outpatient Medications  Medication Sig  Dispense Refill   amoxicillin (AMOXIL) 500 MG capsule Take 1 capsule (500 mg total) by mouth 3 (three) times daily. 21 capsule 0   Cholecalciferol (VITAMIN D3) 1000 units CAPS Take 1 capsule by mouth daily.     cloNIDine (CATAPRES) 0.1 MG tablet Take 0.1 mg by mouth at bedtime.     DULoxetine (CYMBALTA) 60 MG capsule Take 60 mg by mouth daily.      HYDROcodone-acetaminophen (NORCO/VICODIN) 5-325 MG tablet Take 1 tablet by mouth every 4 (four) hours as needed. (Patient not taking: Reported on 12/26/2020) 10 tablet 0   hydroquinone 4 % cream Apply topically. (Patient not taking: Reported on 12/26/2020)     Multiple Vitamins-Minerals (ONE-A-DAY WOMENS 50 PLUS PO) Take 1 tablet by mouth daily.     pantoprazole (PROTONIX) 40 MG tablet Take 1 tablet (40 mg total) by mouth daily. 90 tablet 3   predniSONE (DELTASONE) 10 MG tablet Take 2 tablets (20 mg total) by mouth 2 (two) times daily. (Patient not taking: Reported on 12/26/2020) 20 tablet 0   rosuvastatin (CRESTOR) 20 MG tablet Take 20 mg by mouth daily.     triamterene-hydrochlorothiazide (DYAZIDE) 37.5-25 MG capsule Take by mouth. (Patient not taking: Reported on 12/26/2020)     WELLBUTRIN SR 150 MG 12 hr tablet Take 150 mg by mouth daily. (Patient not taking: Reported on 12/26/2020)     No current facility-administered medications for this visit.    OBJECTIVE: African-American woman who appears well  Vitals:   01/20/21 1011  BP: 134/65  Pulse: 92  Resp: 18  Temp: 97.9 F (36.6 C)  SpO2: 100%      Body mass index is 38.39 kg/m.   Wt Readings from Last 3 Encounters:  01/20/21 209 lb 14.4 oz (95.2 kg)  12/26/20 213 lb (96.6 kg)  12/17/20 213 lb (96.6 kg)     ECOG FS:1 - Symptomatic but completely ambulatory  Sclerae unicteric, EOMs intact Wearing a mask No cervical or supraclavicular adenopathy Lungs no rales or rhonchi Heart regular rate and rhythm Abd soft, nontender, positive bowel sounds MSK no focal spinal tenderness, no  upper extremity lymphedema Neuro: nonfocal, well oriented, appropriate affect Breasts: The right breast is status postlumpectomy and radiation.  There is some residual hyperpigmentation but  no evidence of local recurrence.  The left breast is benign.  Both axillae are benign.   LAB RESULTS:  CMP     Component Value Date/Time   NA 139 01/20/2021 0947   K 3.6 01/20/2021 0947   CL 107 01/20/2021 0947   CO2 23 01/20/2021 0947   GLUCOSE 187 (H) 01/20/2021 0947   BUN 18 01/20/2021 0947   CREATININE 0.94 01/20/2021 0947   CREATININE 0.94 04/13/2013 1411   CALCIUM 9.4 01/20/2021 0947   PROT 6.8 01/20/2021 0947   ALBUMIN 3.9 01/20/2021 0947   AST 23 01/20/2021 0947   ALT 41 01/20/2021 0947   ALKPHOS 79 01/20/2021 0947   BILITOT 1.1 01/20/2021 0947   GFRNONAA >60 01/20/2021 0947   GFRNONAA 68 01/24/2013 0942   GFRAA >60 08/07/2019 0850   GFRAA >60 08/23/2018 1006   GFRAA 79 01/24/2013 0942    No results found for: TOTALPROTELP, ALBUMINELP, A1GS, A2GS, BETS, BETA2SER, GAMS, MSPIKE, SPEI  No results found for: KPAFRELGTCHN, LAMBDASER, KAPLAMBRATIO  Lab Results  Component Value Date   WBC 5.6 01/20/2021   NEUTROABS 3.0 01/20/2021   HGB 14.2 01/20/2021   HCT 42.6 01/20/2021   MCV 90.8 01/20/2021   PLT 265 01/20/2021    Lab Results  Component Value Date   LABCA2 30 07/28/2010    No components found for: JOACZY606  No results for input(s): INR in the last 168 hours.  Lab Results  Component Value Date   LABCA2 30 07/28/2010    No results found for: CAN199  Lab Results  Component Value Date   CAN125 10.1 01/26/2020    No results found for: TKZ601  No results found for: CA2729  No components found for: HGQUANT  No results found for: CEA1 / No results found for: CEA1   No results found for: AFPTUMOR  No results found for: CHROMOGRNA  No results found for: HGBA, HGBA2QUANT, HGBFQUANT, HGBSQUAN (Hemoglobinopathy evaluation)   No results found for: LDH  No  results found for: IRON, TIBC, IRONPCTSAT (Iron and TIBC)  No results found for: FERRITIN  Urinalysis    Component Value Date/Time   COLORURINE YELLOW 10/22/2018 2130   APPEARANCEUR CLEAR 10/22/2018 2130   LABSPEC <1.005 (L) 10/22/2018 2130   PHURINE 6.5 10/22/2018 2130   GLUCOSEU NEGATIVE 10/22/2018 2130   HGBUR NEGATIVE 10/22/2018 2130   BILIRUBINUR NEGATIVE 10/22/2018 2130   BILIRUBINUR n 04/13/2013 1318   [place] NEGATIVE 10/22/2018 2130   PROTEINUR NEGATIVE 10/22/2018 2130   UROBILINOGEN negative 04/13/2013 1318   NITRITE NEGATIVE 10/22/2018 2130   LEUKOCYTESUR NEGATIVE 10/22/2018 2130    STUDIES: No results found.   ELIGIBLE FOR AVAILABLE RESEARCH PROTOCOL: no  ASSESSMENT: 70 y.o. [place] , [place] woman status post left lumpectomy February 2008 for a grade 3 ductal carcinoma in situ which was strongly estrogen and progesterone receptor positive.  (a)   adjuvant radiation completed May 2008  (b)  on tamoxifen July 2008 through March 2013.  (1) status post right breast upper outer quadrant biopsy 06/22/2017 for a clinnical T1b N0, stage IA invasive ductal carcinoma, with extracellular mucin; grade 2; estrogen and progesterone receptor positive, HER-2 not amplified, with an MIB-1 of 20%.  (2) Genetics testing on 07/08/2017 through [facility] Multi- Cancer Panel showed a Pathogenic variant  in ATM (c.7913G>A (p.[id number]*)  (a)  VUS identified in DICER1 and POLE  (b) no additional deleterious mutations were noted in APC, ATM, AXIN2, BARD1, BMPR1A, BRCA1, BRCA2, BRIP1, CDH1, CDK4, CDKN2A (p14ARF), CDKN2A (p16INK4a),  CHEK2, CTNNA1, DICER1, EPCAM*, GREM1*, KIT, MEN1, MLH1, MSH2, MSH3, MSH6, MUTYH, NBN, NF1, PALB2, PDGFRA, PMS2, POLD1, POLE, PTEN, RAD50, RAD51C, RAD51D, SDHB, SDHC, SDHD, SMAD4, SMARCA4, STK11, TP53, TSC1, TSC2, VHL. The following genes were evaluated for sequence changes only: HOXB13*, NTHL1*, SDHA   (c) patients with ATM mutations are at increased risk of breast  cancer potential increase in ovarian cancer and pancreatic cancer risk  (3) Oncotype obtained from the 06/22/2017 biopsy showed a score of 25, predicting a risk of recurrence outside the breast over the next 9 years of 12% if the patient took antiestrogens for 5 years.  It also shows no benefit from chemotherapy.  (4) status post right lumpectomy and sentinel lymph node sampling 07/22/2017 for a pT1b pN0, stage Ia invasive ductal carcinoma, grade 2, with extracellular mucin, and negative margins.  (a) a total of 3 lymph nodes were removed  (5) adjuvant radiation 08/31/2017 to 10/19/2017  Site/dose:    1. The Right breast was treated to 50.4 Gy in 28 fractions of 1.8 Gy. 2. The Right breast was boosted to 10 Gy in 5 fractions of 2 Gy.  (6) started anastrozole 11/14/2017  (a) bone density December 2020  (7) intensified screening:   (a) breast MRI December 2020, repeat Fall 2021 and yearly  (b) mammography 06/26/2019, repeat Spring 2022 and yearly  (8) ovarian cancer screening: per Dr Sabra Heck   PLAN: Katelyn Porter is now 3-1/2 years out from definitive surgery for her breast cancer with no evidence of disease recurrence.  This is very favorable.  She is tolerating anastrozole well and the plan will be to continue that a minimum of 5 years.  She has a significant family history of diabetes and her most recent blood glucose was elevated.  We discussed cutting back or eliminating carbs as much as possible and also starting an exercise program.  [facility].  She did not have an MRI this year.  Recall she has an ATM mutation which increases the breast cancer risk.  She does not recall what she paid for the MRI and she had last year.  We decided after much discussion that she would have her mammogram early next year and then repeat MRI in the fall 2023 but if it is too expensive with her new insurance  then she may wish to not follow-up on that.  She will inquire regarding cost prior to undergoing the procedure  Otherwise she will see [place] again in 1 year.  She knows to call for any other issue that may develop before then  Total encounter time 25 minutes.*   Dahmir Epperly, Virgie Dad, MD  01/20/21 10:49 AM Medical Oncology and Hematology [facility] [place], [place] [address] Tel. [phone]    Fax. [phone]   I, Wilburn Mylar, am acting as scribe for Dr. Virgie Dad. Trea Carnegie.  I, Lurline Del MD, have reviewed the above documentation for accuracy and completeness, and I agree with the above.   *Total Encounter Time as defined by the Centers for Medicare and Medicaid Services includes, in addition to the face-to-face time of a patient visit (documented in the note above) non-face-to-face time: obtaining and reviewing outside history, ordering and reviewing medications, tests or procedures, care coordination (communications with other health care professionals or caregivers) and documentation in the medical record.

## 2021-01-20 ENCOUNTER — Inpatient Hospital Stay: Payer: Medicare HMO

## 2021-01-20 ENCOUNTER — Other Ambulatory Visit: Payer: Self-pay

## 2021-01-20 ENCOUNTER — Inpatient Hospital Stay: Payer: Medicare HMO | Attending: Oncology | Admitting: Oncology

## 2021-01-20 VITALS — BP 134/65 | HR 92 | Temp 97.9°F | Resp 18 | Ht 62.0 in | Wt 209.9 lb

## 2021-01-20 DIAGNOSIS — Z170 Estrogen receptor positive status [ER+]: Secondary | ICD-10-CM

## 2021-01-20 DIAGNOSIS — Z79811 Long term (current) use of aromatase inhibitors: Secondary | ICD-10-CM | POA: Diagnosis not present

## 2021-01-20 DIAGNOSIS — C50411 Malignant neoplasm of upper-outer quadrant of right female breast: Secondary | ICD-10-CM | POA: Diagnosis not present

## 2021-01-20 DIAGNOSIS — C50912 Malignant neoplasm of unspecified site of left female breast: Secondary | ICD-10-CM

## 2021-01-20 LAB — CMP (CANCER CENTER ONLY)
ALT: 41 U/L (ref 0–44)
AST: 23 U/L (ref 15–41)
Albumin: 3.9 g/dL (ref 3.5–5.0)
Alkaline Phosphatase: 79 U/L (ref 38–126)
Anion gap: 9 (ref 5–15)
BUN: 18 mg/dL (ref 8–23)
CO2: 23 mmol/L (ref 22–32)
Calcium: 9.4 mg/dL (ref 8.9–10.3)
Chloride: 107 mmol/L (ref 98–111)
Creatinine: 0.94 mg/dL (ref 0.44–1.00)
GFR, Estimated: >60 mL/min (ref >60)
Glucose, Bld: 187 mg/dL — ABNORMAL HIGH (ref 70–99)
Potassium: 3.6 mmol/L (ref 3.5–5.1)
Sodium: 139 mmol/L (ref 135–145)
Total Bilirubin: 1.1 mg/dL (ref 0.3–1.2)
Total Protein: 6.8 g/dL (ref 6.5–8.1)

## 2021-01-20 LAB — CBC WITH DIFFERENTIAL (CANCER CENTER ONLY)
Abs Immature Granulocytes: 0.02 10*3/uL (ref 0.00–0.07)
Basophils Absolute: 0 10*3/uL (ref 0.0–0.1)
Basophils Relative: 1 %
Eosinophils Absolute: 0.1 10*3/uL (ref 0.0–0.5)
Eosinophils Relative: 2 %
HCT: 42.6 % (ref 36.0–46.0)
Hemoglobin: 14.2 g/dL (ref 12.0–15.0)
Immature Granulocytes: 0 %
Lymphocytes Relative: 36 %
Lymphs Abs: 2 10*3/uL (ref 0.7–4.0)
MCH: 30.3 pg (ref 26.0–34.0)
MCHC: 33.3 g/dL (ref 30.0–36.0)
MCV: 90.8 fL (ref 80.0–100.0)
Monocytes Absolute: 0.5 10*3/uL (ref 0.1–1.0)
Monocytes Relative: 8 %
Neutro Abs: 3 10*3/uL (ref 1.7–7.7)
Neutrophils Relative %: 53 %
Platelet Count: 265 10*3/uL (ref 150–400)
RBC: 4.69 MIL/uL (ref 3.87–5.11)
RDW: 13.2 % (ref 11.5–15.5)
WBC Count: 5.6 10*3/uL (ref 4.0–10.5)
nRBC: 0 % (ref 0.0–0.2)

## 2021-02-12 ENCOUNTER — Encounter: Payer: Self-pay | Admitting: Physician Assistant

## 2021-02-12 ENCOUNTER — Ambulatory Visit: Payer: Medicare HMO | Admitting: Physician Assistant

## 2021-02-12 VITALS — BP 130/64 | HR 74 | Ht 64.0 in | Wt 212.2 lb

## 2021-02-12 DIAGNOSIS — R059 Cough, unspecified: Secondary | ICD-10-CM

## 2021-02-12 DIAGNOSIS — K219 Gastro-esophageal reflux disease without esophagitis: Secondary | ICD-10-CM

## 2021-02-12 MED ORDER — PANTOPRAZOLE SODIUM 40 MG PO TBEC: 40.0000 mg | DELAYED_RELEASE_TABLET | Freq: Two times a day (BID) | ORAL | 3 refills | Status: DC

## 2021-02-12 NOTE — Progress Notes (Signed)
Assessment and plans noted ?

## 2021-02-12 NOTE — Patient Instructions (Signed)
If you are age 69 or older, your body mass index should be between 23-30. Your Body mass index is 36.42 kg/m. If this is out of the aforementioned range listed, please consider follow up with your Primary Care Provider. ________________________________________________________  The [facility] providers would like to encourage you to use [facility] to communicate with providers for non-urgent requests or questions.  Due to long hold times on the telephone, sending your provider a message by [facility] may be a faster and more efficient way to get a response.  Please allow 48 business hours for a response.  Please remember that this is for non-urgent requests.  _______________________________________________________  Dennis Bast have been scheduled for a Barium Esophogram at [facility] (1st floor of the hospital) on 02/25/2021 at 9:30 am. Please arrive 15 minutes prior to your appointment for registration. Make certain not to have anything to eat or drink 3 hours prior to your test. If you need to reschedule for any reason, please contact radiology at [phone] to do so. __________________________________________________________________ A barium swallow is an examination that concentrates on views of the esophagus. This tends to be a double contrast exam (barium and two liquids which, when combined, create a gas to distend the wall of the oesophagus) or single contrast (non-ionic iodine based). The study is usually tailored to your symptoms so a good history is essential. Attention is paid during the study to the form, structure and configuration of the esophagus, looking for functional disorders (such as aspiration, dysphagia, achalasia, motility and reflux) EXAMINATION You may be asked to change into a gown, depending on the type of swallow being performed. A radiologist and radiographer will perform the procedure. The radiologist will advise you of the type of contrast selected for your procedure and  direct you during the exam. You will be asked to stand, sit or lie in several different positions and to hold a small amount of fluid in your mouth before being asked to swallow while the imaging is performed .In some instances you may be asked to swallow barium coated marshmallows to assess the motility of a solid food bolus. The exam can be recorded as a digital or video fluoroscopy procedure. POST PROCEDURE It will take 1-2 days for the barium to pass through your system. To facilitate this, it is important, unless otherwise directed, to increase your fluids for the next 24-48hrs and to resume your normal diet.  This test typically takes about 30 minutes to perform. ____________________________________________________________  INCREASE your Pantoprazole 40 mg to twice daily. We have sent refills to your pharmacy.  Follow up pending the results of your Barium Swallow.  Thank you for entrusting me with your care and choosing [facility].  Ellouise Newer, PA-C

## 2021-02-12 NOTE — Progress Notes (Signed)
Chief Complaint: Cough  HPI:    Katelyn Porter is a 69 year old African-American female with a past medical history of breast cancer, depression, reflux and IBS, known to Dr. Henrene Pastor, who presents to clinic today with a complaint of cough.    10/25/2020 patient seen in clinic by Dr. Henrene Pastor for her history of colon polyps as well as discussion of postprandial bloating with occasional vomiting and alternating bowel habits.  At that time recommended a colonoscopy and endoscopy.  Also recommended fiber.    12/26/2020 EGD for reflux, abdominal bloating abdominal discomfort with some mucosal changes of retching/gastropathy (vomited prep) in the proximal portion, several diminutive fundic gland type polyps and otherwise unremarkable.  Colonoscopy the same day with incomplete exam due to poor prep.  Repeat was recommended in 6 months with a more extensive prep, recommended a bottle of magnesium citrate the day prior to the exam followed by standard Suprep or Sutab.    Today, the patient tells me that she is supposed to be taking her Pantoprazole 40 mg twice a day but she is running out and has not been taking it consistently.  She asked for refill.  Describes that over the past few months she has had a cough anytime she tries to eat just about anything.  Oftentimes it will set off such a terrible cough that she cannot eat anymore.  Describes an illness back in October which actually left her unable to leave her house for 3 weeks, started with sore throat and upper congestion, the thought was that she had COVID but all tests were negative.  It has taken her a long time to recover since then.  She has already seen an ENT in regards to ongoing sore throat and that is better now but now her biggest complaint is this cough making it hard for her to eat and also resulting in some hoarseness.  Denies any known sinus congestion or nasal drip.      Tells me her PCP is set her up with a pulmonologist and cardiologist who she is  seeing within the next couple of weeks.    Denies fever, chills, weight loss, blood in her stool or abdominal pain.  Past Medical History:  Diagnosis Date   Breast cancer ([place]) 2008   left, with radiation finished 08, tamoxifen   Breast cancer ([place]) 2019   Rt. had lymph node removed  having radiation currently   Complication of anesthesia    woke up with colonoscopy and hysterectomy   Depression 01/24/2013   Family history of breast cancer    Family history of colon cancer    GERD (gastroesophageal reflux disease)    Hyperlipidemia    Hypertension    IBS (irritable bowel syndrome)    Palpitations    Personal history of radiation therapy 2007   left breast   Personal history of radiation therapy 2019   right breast   Vertigo     Past Surgical History:  Procedure Laterality Date   ABDOMINAL HYSTERECTOMY  1985   TAH (ovaries remain)   BREAST LUMPECTOMY Left 2007   BREAST LUMPECTOMY Right 2019   BREAST LUMPECTOMY WITH RADIOACTIVE SEED AND SENTINEL LYMPH NODE BIOPSY Right 07/22/2017   Procedure: BREAST LUMPECTOMY WITH RADIOACTIVE SEED AND SENTINEL LYMPH NODE BIOPSY;  Surgeon: Erroll Luna, MD;  Location: [facility];  Service: General;  Laterality: Right;   COLONOSCOPY     TONSILLECTOMY     age 60    Current Outpatient Medications  Medication Sig Dispense Refill   Cholecalciferol (VITAMIN D3) 1000 units CAPS Take 1 capsule by mouth daily.     cloNIDine (CATAPRES) 0.1 MG tablet Take 0.1 mg by mouth at bedtime.     DULoxetine (CYMBALTA) 60 MG capsule Take 60 mg by mouth daily.      Multiple Vitamins-Minerals (ONE-A-DAY WOMENS 50 PLUS PO) Take 1 tablet by mouth daily.     pantoprazole (PROTONIX) 40 MG tablet Take 1 tablet (40 mg total) by mouth daily. 90 tablet 3   rosuvastatin (CRESTOR) 20 MG tablet Take 20 mg by mouth daily.     triamterene-hydrochlorothiazide (DYAZIDE) 37.5-25 MG capsule Take by mouth.     WELLBUTRIN SR 150 MG 12 hr tablet Take 150 mg by mouth  daily.     No current facility-administered medications for this visit.    Allergies as of 02/12/2021 - Review Complete 02/12/2021  Allergen Reaction Noted   Crab [shellfish allergy] Swelling 06/22/2012   Sulfa antibiotics Other (See Comments) 07/03/2014   Lipitor [atorvastatin]  02/03/2013   Sulfa drugs cross reactors  07/22/2010    Family History  Problem Relation Age of Onset   Colon cancer Mother 79   Hypertension Mother    Kidney disease Mother    Diabetes Mother    Lung cancer Father        Died at 68 from lung cancer   Heart disease Father        heart attack   Hypertension Sister    Diabetes Sister    Colon polyps Sister    Coronary artery disease Brother 4       died of "massive heart attack"   Diabetes Brother    Ovarian cancer Paternal Grandmother    Breast cancer Maternal Aunt        dx in her 37s   Colon cancer Cousin        maternal 1st cousin dx over 11   Breast cancer Cousin        mat 1st cousin dx in her 80s   Esophageal cancer Neg Hx    Stomach cancer Neg Hx    Rectal cancer Neg Hx    Pancreatic cancer Neg Hx    Liver disease Neg Hx     Social History   Socioeconomic History   Marital status: Widowed    Spouse name: Not on file   Number of children: 0   Years of education: Not on file   Highest education level: Not on file  Occupational History   Occupation: accounts payable    Employer: [employer]  Tobacco Use   Smoking status: Never   Smokeless tobacco: Never  Vaping Use   Vaping Use: Never used  Substance and Sexual Activity   Alcohol use: No   Drug use: No   Sexual activity: Not Currently    Birth control/protection: Surgical    Comment: hysterectomy  Other Topics Concern   Not on file  Social History Narrative   No children   Married   Enjoys quilting and horseback riding.    She works in Pension scheme manager.   She completed 2 years of college.    Social Determinants of Health   Financial Resource Strain:  Not on file  Food Insecurity: Not on file  Transportation Needs: Not on file  Physical Activity: Not on file  Stress: Not on file  Social Connections: Not on file  Intimate Partner Violence: Not on file    Review of Systems:  Constitutional: No weight loss, fever or chills Cardiovascular: No chest pain Respiratory: No SOB Gastrointestinal: See HPI and otherwise negative   Physical Exam:  Vital signs: BP 130/64   Pulse 74   Ht 5\' 4"  (1.626 m)   Wt 212 lb 3.2 oz (96.3 kg)   LMP 03/17/1983   BMI 36.42 kg/m   Constitutional:   Pleasant overweight AA female appears to be in NAD, Well developed, Well nourished, alert and cooperative Respiratory: Respirations even and unlabored. Lungs clear to auscultation bilaterally.   No wheezes, crackles, or rhonchi.  Cardiovascular: Normal S1, S2. No MRG. Regular rate and rhythm. No peripheral edema, cyanosis or pallor.  Gastrointestinal:  Soft, nondistended, nontender. No rebound or guarding. Normal bowel sounds. No appreciable masses or hepatomegaly. Rectal:  Not performed.  Psychiatric: Demonstrates good judgement and reason without abnormal affect or behaviors.  RELEVANT LABS AND IMAGING: CBC    Component Value Date/Time   WBC 5.6 01/20/2021 0947   WBC 6.4 01/18/2020 0941   RBC 4.69 01/20/2021 0947   HGB 14.2 01/20/2021 0947   HGB 13.1 06/01/2011 1353   HCT 42.6 01/20/2021 0947   HCT 39.0 06/01/2011 1353   PLT 265 01/20/2021 0947   PLT 269 06/01/2011 1353   MCV 90.8 01/20/2021 0947   MCV 90.4 06/01/2011 1353   MCH 30.3 01/20/2021 0947   MCHC 33.3 01/20/2021 0947   RDW 13.2 01/20/2021 0947   RDW 12.3 06/01/2011 1353   LYMPHSABS 2.0 01/20/2021 0947   LYMPHSABS 2.1 06/01/2011 1353   MONOABS 0.5 01/20/2021 0947   MONOABS 0.5 06/01/2011 1353   EOSABS 0.1 01/20/2021 0947   EOSABS 0.1 06/01/2011 1353   BASOSABS 0.0 01/20/2021 0947   BASOSABS 0.1 06/01/2011 1353    CMP     Component Value Date/Time   NA 139 01/20/2021 0947    K 3.6 01/20/2021 0947   CL 107 01/20/2021 0947   CO2 23 01/20/2021 0947   GLUCOSE 187 (H) 01/20/2021 0947   BUN 18 01/20/2021 0947   CREATININE 0.94 01/20/2021 0947   CREATININE 0.94 04/13/2013 1411   CALCIUM 9.4 01/20/2021 0947   PROT 6.8 01/20/2021 0947   ALBUMIN 3.9 01/20/2021 0947   AST 23 01/20/2021 0947   ALT 41 01/20/2021 0947   ALKPHOS 79 01/20/2021 0947   BILITOT 1.1 01/20/2021 0947   GFRNONAA >60 01/20/2021 0947   GFRNONAA 68 01/24/2013 0942   GFRAA >60 08/07/2019 0850   GFRAA >60 08/23/2018 1006   GFRAA 79 01/24/2013 0942    Assessment: 1.  Cough: Over the past few months making it hard for her to eat, recent EGD unrevealing; consider esophageal dysmotility versus postnasal drip versus other 2.  GERD: Controlled on Pantoprazole 40 mg twice daily  Plan: 1.  Refilled patient's Pantoprazole 40 mg twice daily, 30-60 minutes before breakfast and dinner.  #60 with 11 refills. 2.  Encouraged the patient to keep follow-up/appointments with pulmonology and cardiology 3.  Explained that I do not feel like this is related to her reflux, but we will order a barium swallow with tablet to see if there is any esophageal dysmotility which could be playing a role 4.  Patient to follow in clinic per recommendations after imaging.  Ellouise Newer, PA-C [facility] 02/12/2021, 10:48 AM  Cc: [name], DO

## 2021-02-25 ENCOUNTER — Other Ambulatory Visit: Payer: Self-pay

## 2021-02-25 ENCOUNTER — Ambulatory Visit (HOSPITAL_COMMUNITY)
Admission: RE | Admit: 2021-02-25 | Discharge: 2021-02-25 | Disposition: A | Discharge status: Home / Self Care | Payer: Medicare HMO | Source: Ambulatory Visit | Attending: Physician Assistant | Admitting: Physician Assistant

## 2021-02-25 DIAGNOSIS — K219 Gastro-esophageal reflux disease without esophagitis: Secondary | ICD-10-CM | POA: Diagnosis present

## 2021-02-25 DIAGNOSIS — R059 Cough, unspecified: Secondary | ICD-10-CM | POA: Diagnosis present

## 2021-02-26 ENCOUNTER — Other Ambulatory Visit (HOSPITAL_COMMUNITY): Payer: Self-pay

## 2021-02-26 ENCOUNTER — Other Ambulatory Visit: Payer: Self-pay

## 2021-02-26 DIAGNOSIS — R059 Cough, unspecified: Secondary | ICD-10-CM

## 2021-02-26 DIAGNOSIS — K219 Gastro-esophageal reflux disease without esophagitis: Secondary | ICD-10-CM

## 2021-02-26 DIAGNOSIS — R1310 Dysphagia, unspecified: Secondary | ICD-10-CM

## 2021-03-11 DIAGNOSIS — I4729 Other ventricular tachycardia: Secondary | ICD-10-CM | POA: Insufficient documentation

## 2021-03-11 HISTORY — DX: Other ventricular tachycardia: I47.29

## 2021-03-14 ENCOUNTER — Ambulatory Visit (HOSPITAL_COMMUNITY)
Admission: RE | Admit: 2021-03-14 | Discharge: 2021-03-14 | Disposition: A | Discharge status: Home / Self Care | Payer: Medicare HMO | Source: Ambulatory Visit | Attending: Internal Medicine | Admitting: Internal Medicine

## 2021-03-14 ENCOUNTER — Other Ambulatory Visit: Payer: Self-pay

## 2021-03-14 DIAGNOSIS — K219 Gastro-esophageal reflux disease without esophagitis: Secondary | ICD-10-CM

## 2021-03-14 DIAGNOSIS — R1310 Dysphagia, unspecified: Secondary | ICD-10-CM

## 2021-03-14 DIAGNOSIS — R059 Cough, unspecified: Secondary | ICD-10-CM | POA: Diagnosis present

## 2021-03-14 NOTE — Progress Notes (Signed)
Modified Barium Swallow Progress Note  Patient Details  Name: Katelyn Porter MRN: [id number] Date of Birth: Aug 17, 1950  Today's Date: 03/14/2021  Modified Barium Swallow completed.  Full report located under Chart Review in the Imaging Section.  Brief recommendations include the following:  Clinical Impression  Pt presents with normal oropharyngeal swallowing function. She did have intermittent coughing and an episode of throat clearing during this study, but there was no associated aspiration or oropharyngeal dysphagia noted. Recommend that she continue with regular solids and thin liquids as tolerated.   Swallow Evaluation Recommendations   Recommended Consults:  (pt already following with GI and ENT)   SLP Diet Recommendations: Regular solids;Thin liquid   Liquid Administration via: Cup;Straw   Medication Administration: Whole meds with liquid   Supervision: Patient able to self feed   Postural Changes: Seated upright at 90 degrees   Oral Care Recommendations: Oral care BID        Osie Bond., M.A. [facility] Pager [phone] Office [phone]  03/14/2021,11:33 AM

## 2021-03-19 ENCOUNTER — Other Ambulatory Visit: Payer: Self-pay

## 2021-03-19 ENCOUNTER — Ambulatory Visit: Payer: Medicare HMO

## 2021-04-24 DIAGNOSIS — R053 Chronic cough: Secondary | ICD-10-CM

## 2021-04-24 HISTORY — DX: Chronic cough: R05.3

## 2021-04-30 ENCOUNTER — Ambulatory Visit: Payer: Medicare HMO | Admitting: Student

## 2021-04-30 ENCOUNTER — Encounter: Payer: Self-pay | Admitting: Student

## 2021-04-30 ENCOUNTER — Ambulatory Visit (INDEPENDENT_AMBULATORY_CARE_PROVIDER_SITE_OTHER): Payer: Medicare HMO

## 2021-04-30 ENCOUNTER — Other Ambulatory Visit: Payer: Self-pay

## 2021-04-30 VITALS — BP 124/80 | HR 88 | Temp 98.1°F | Ht 64.0 in | Wt 220.2 lb

## 2021-04-30 DIAGNOSIS — R053 Chronic cough: Secondary | ICD-10-CM

## 2021-04-30 NOTE — Progress Notes (Signed)
Synopsis: Referred for chronic cough by Jolene Provost, PA-C  Subjective:   PATIENT ID: Katelyn Porter GENDER: female DOB: 1950-09-28, MRN: [id number]  Chief Complaint  Patient presents with   Consult    Pt has had complaints of a cough for 4 months now. States her cough can happen at any point of the day and states her cough is worse while she eats.   71yF with history of left breast cancer s/p XRT 2008, GERD seen for chronic cough. Never did have covid-19.   Cough has been going on since October. Saw Dr. Henrene Pastor with GI and has had a couple of swallow evals. Has been on pantoprazole 40 BID taking 30 min before meals. Only occasional sensation of postnasal drainage. She has developed some hoarseness.   She saw ENT 04/24/21, normal FNL.  No DOE. Never had asthma as a kid.   Otherwise pertinent review of systems is negative.  Father had lung cancer  She is retired from working in Press photographer. Has never lived outside of [place]. She has a cat that lives outside, 2 horses. She does clean the stalls. Never smoked. Husband used to smoke.   Past Medical History:  Diagnosis Date   Breast cancer ([place]) 2008   left, with radiation finished 08, tamoxifen   Breast cancer ([place]) 2019   Rt. had lymph node removed  having radiation currently   Complication of anesthesia    woke up with colonoscopy and hysterectomy   Depression 01/24/2013   Family history of breast cancer    Family history of colon cancer    GERD (gastroesophageal reflux disease)    Hyperlipidemia    Hypertension    IBS (irritable bowel syndrome)    Palpitations    Personal history of radiation therapy 2007   left breast   Personal history of radiation therapy 2019   right breast   Vertigo      Family History  Problem Relation Age of Onset   Colon cancer Mother 64   Hypertension Mother    Kidney disease Mother    Diabetes Mother    Lung cancer Father        Died at 17 from lung cancer   Heart disease Father         heart attack   Hypertension Sister    Diabetes Sister    Colon polyps Sister    Coronary artery disease Brother 38       died of "massive heart attack"   Diabetes Brother    Ovarian cancer Paternal Grandmother    Breast cancer Maternal Aunt        dx in her 77s   Colon cancer Cousin        maternal 1st cousin dx over 23   Breast cancer Cousin        mat 1st cousin dx in her 59s   Esophageal cancer Neg Hx    Stomach cancer Neg Hx    Rectal cancer Neg Hx    Pancreatic cancer Neg Hx    Liver disease Neg Hx      Past Surgical History:  Procedure Laterality Date   ABDOMINAL HYSTERECTOMY  1985   TAH (ovaries remain)   BREAST LUMPECTOMY Left 2007   BREAST LUMPECTOMY Right 2019   BREAST LUMPECTOMY WITH RADIOACTIVE SEED AND SENTINEL LYMPH NODE BIOPSY Right 07/22/2017   Procedure: BREAST LUMPECTOMY WITH RADIOACTIVE SEED AND SENTINEL LYMPH NODE BIOPSY;  Surgeon: Erroll Luna, MD;  Location: [facility];  Service: General;  Laterality: Right;   COLONOSCOPY     TONSILLECTOMY     age 43    Social History   Socioeconomic History   Marital status: Widowed    Spouse name: Not on file   Number of children: 0   Years of education: Not on file   Highest education level: Not on file  Occupational History   Occupation: accounts payable    Employer: [employer]  Tobacco Use   Smoking status: Never   Smokeless tobacco: Never  Vaping Use   Vaping Use: Never used  Substance and Sexual Activity   Alcohol use: No   Drug use: No   Sexual activity: Not Currently    Birth control/protection: Surgical    Comment: hysterectomy  Other Topics Concern   Not on file  Social History Narrative   No children   Married   Enjoys quilting and horseback riding.    She works in Pension scheme manager.   She completed 2 years of college.    Social Determinants of Health   Financial Resource Strain: Not on file  Food Insecurity: Not on file  Transportation Needs: Not on  file  Physical Activity: Not on file  Stress: Not on file  Social Connections: Not on file  Intimate Partner Violence: Not on file     Allergies  Allergen Reactions   Crab [Shellfish Allergy] Swelling    Blisters on tongue.   Sulfa Antibiotics Other (See Comments)    headache   Lipitor [Atorvastatin]     Muscle cramping   Sulfa Drugs Cross Reactors      Outpatient Medications Prior to Visit  Medication Sig Dispense Refill   Cholecalciferol (VITAMIN D3) 1000 units CAPS Take 1 capsule by mouth daily.     cloNIDine (CATAPRES) 0.1 MG tablet Take 0.1 mg by mouth at bedtime.     DULoxetine (CYMBALTA) 60 MG capsule Take 60 mg by mouth daily.      Multiple Vitamins-Minerals (ONE-A-DAY WOMENS 50 PLUS PO) Take 1 tablet by mouth daily.     pantoprazole (PROTONIX) 40 MG tablet Take 1 tablet (40 mg total) by mouth 2 (two) times daily before a meal. 180 tablet 3   rosuvastatin (CRESTOR) 20 MG tablet Take 20 mg by mouth daily.     triamterene-hydrochlorothiazide (DYAZIDE) 37.5-25 MG capsule Take by mouth.     WELLBUTRIN SR 150 MG 12 hr tablet Take 150 mg by mouth daily.     No facility-administered medications prior to visit.       Objective:   Physical Exam:  General appearance: 71 y.o.,, female, NAD, conversant  Eyes: anicteric sclerae; PERRL, tracking appropriately HENT: NCAT; MMM Neck: Trachea midline; no lymphadenopathy, no JVD Lungs: CTAB, no crackles, no wheeze, with normal respiratory effort CV: RRR, no murmur  Abdomen: Soft, non-tender; non-distended, BS present  Extremities: No peripheral edema, warm Skin: Normal turgor and texture; no rash Psych: Appropriate affect Neuro: Alert and oriented to person and place, no focal deficit     Vitals:   04/30/21 1544  BP: 124/80  Pulse: 88  Temp: 98.1 F (36.7 C)  TempSrc: Oral  SpO2: 99%  Weight: 220 lb 3.2 oz (99.9 kg)  Height: 5\' 4"  (1.626 m)   99% on RA BMI Readings from Last 3 Encounters:  04/30/21 37.80 kg/m   02/12/21 36.42 kg/m  01/20/21 38.39 kg/m   Wt Readings from Last 3 Encounters:  04/30/21 220 lb 3.2 oz (99.9 kg)  02/12/21 212 lb  3.2 oz (96.3 kg)  01/20/21 209 lb 14.4 oz (95.2 kg)     CBC    Component Value Date/Time   WBC 5.6 01/20/2021 0947   WBC 6.4 01/18/2020 0941   RBC 4.69 01/20/2021 0947   HGB 14.2 01/20/2021 0947   HGB 13.1 06/01/2011 1353   HCT 42.6 01/20/2021 0947   HCT 39.0 06/01/2011 1353   PLT 265 01/20/2021 0947   PLT 269 06/01/2011 1353   MCV 90.8 01/20/2021 0947   MCV 90.4 06/01/2011 1353   MCH 30.3 01/20/2021 0947   MCHC 33.3 01/20/2021 0947   RDW 13.2 01/20/2021 0947   RDW 12.3 06/01/2011 1353   LYMPHSABS 2.0 01/20/2021 0947   LYMPHSABS 2.1 06/01/2011 1353   MONOABS 0.5 01/20/2021 0947   MONOABS 0.5 06/01/2011 1353   EOSABS 0.1 01/20/2021 0947   EOSABS 0.1 06/01/2011 1353   BASOSABS 0.0 01/20/2021 0947   BASOSABS 0.1 06/01/2011 1353    Eos 100-200  Chest Imaging:  CXR today reviewed by me unchanged, final read pending  CXR 10/2018 reviewed by me unremarkable  CT A/P lung bases 2020 reviewed by me unremarkable  Barium swallow study on 02/25/21 demonstrated  mild dysmotility, intact primary wave but proximal escape of approximately 30% of the ingested bolus with stasis of a moderate amount in the proximal esophagus. Patient experienced cough during this time without clear cause.   No fixed narrowing.  No gastroesophageal reflux during observation.   Modified barium swallow study performed on 03/14/21 demonstrated no significant abnormality identified. No evidence of laryngeal penetration or aspiration  FNL by ENT 04/24/21: Flexible laryngoscopy shows patent anterior nasal cavity with minimal crusting, no discharge or infection.  Normal base of tongue and supraglottis Normal vocal cord mobility without vocal cord nodule, mass, polyp or tumor. Hypopharynx normal without mass, pooling of secretions or aspiration. Unremarkable exam.    Pulmonary Functions Testing Results: No flowsheet data found.      Assessment & Plan:   # Chronic cough:  Has made great effort to address any components of GERD, post nasal drainage. Unclear etiology.  Plan: - PFT - if not suggestive of asthma then consideration of methacholine challenge or bronch for NAEB or trial of lyrica or gabapentin after discussion of risks/benefits     Maryjane Hurter, MD [facility] 04/30/2021 4:17 PM

## 2021-04-30 NOTE — Patient Instructions (Signed)
-   You will be called to schedule breathing tests on same day as next appointment in about 4 weeks

## 2021-05-01 NOTE — Telephone Encounter (Signed)
Received the following message from patient:   "The chest x-ray was done before I left but you mentioned a CT of my chest. Will the office call me to schedule."   Reviewed patient's note and did not see any mentioning of a CT scan. Dr. Verlee Monte, can you please advise? Thanks!

## 2021-05-09 ENCOUNTER — Ambulatory Visit (HOSPITAL_BASED_OUTPATIENT_CLINIC_OR_DEPARTMENT_OTHER)
Admission: RE | Admit: 2021-05-09 | Discharge: 2021-05-09 | Disposition: A | Discharge status: Home / Self Care | Payer: Medicare HMO | Source: Ambulatory Visit | Attending: Student | Admitting: Student

## 2021-05-09 ENCOUNTER — Other Ambulatory Visit: Payer: Self-pay

## 2021-05-09 DIAGNOSIS — R053 Chronic cough: Secondary | ICD-10-CM | POA: Insufficient documentation

## 2021-05-12 ENCOUNTER — Encounter: Payer: Self-pay | Admitting: Pulmonary Disease

## 2021-05-13 NOTE — Telephone Encounter (Signed)
Patient is asking about her CT results please advise

## 2021-05-23 ENCOUNTER — Other Ambulatory Visit: Payer: Self-pay

## 2021-05-23 ENCOUNTER — Ambulatory Visit (INDEPENDENT_AMBULATORY_CARE_PROVIDER_SITE_OTHER): Payer: Medicare HMO | Admitting: Student

## 2021-05-23 DIAGNOSIS — R053 Chronic cough: Secondary | ICD-10-CM | POA: Diagnosis not present

## 2021-05-23 LAB — PULMONARY FUNCTION TEST
DL/VA % pred: 120 %
DL/VA: 5.01 ml/min/mmHg/L
DLCO cor % pred: 89 %
DLCO cor: 17.4 ml/min/mmHg
DLCO unc % pred: 89 %
DLCO unc: 17.4 ml/min/mmHg
FEF 25-75 Post: 2.17 L/sec
FEF 25-75 Pre: 2.1 L/sec
FEF2575-%Change-Post: 3 %
FEF2575-%Pred-Post: 125 %
FEF2575-%Pred-Pre: 121 %
FEV1-%Change-Post: 1 %
FEV1-%Pred-Post: 103 %
FEV1-%Pred-Pre: 101 %
FEV1-Post: 1.93 L
FEV1-Pre: 1.9 L
FEV1FVC-%Change-Post: 5 %
FEV1FVC-%Pred-Pre: 103 %
FEV6-%Change-Post: 1 %
FEV6-%Pred-Post: 99 %
FEV6-%Pred-Pre: 97 %
FEV6-Post: 2.29 L
FEV6-Pre: 2.25 L
FEV6FVC-%Pred-Post: 104 %
FEV6FVC-%Pred-Pre: 104 %
FVC-%Change-Post: -3 %
FVC-%Pred-Post: 95 %
FVC-%Pred-Pre: 98 %
FVC-Post: 2.29 L
FVC-Pre: 2.36 L
Post FEV1/FVC ratio: 84 %
Post FEV6/FVC ratio: 100 %
Pre FEV1/FVC ratio: 80 %
Pre FEV6/FVC Ratio: 100 %
RV % pred: 76 %
RV: 1.66 L
TLC % pred: 84 %
TLC: 4.26 L

## 2021-05-23 NOTE — Patient Instructions (Signed)
Full PFT performed today. °

## 2021-05-23 NOTE — Progress Notes (Signed)
Full PFT performed today. °

## 2021-05-26 ENCOUNTER — Encounter: Payer: Self-pay | Admitting: Internal Medicine

## 2021-05-26 ENCOUNTER — Ambulatory Visit: Payer: Medicare HMO | Admitting: Pulmonary Disease

## 2021-05-26 ENCOUNTER — Other Ambulatory Visit: Payer: Self-pay

## 2021-05-26 ENCOUNTER — Ambulatory Visit: Payer: Medicare HMO | Admitting: Internal Medicine

## 2021-05-26 VITALS — BP 140/76 | HR 73 | Temp 97.8°F | Resp 18 | Ht 63.5 in | Wt 223.2 lb

## 2021-05-26 DIAGNOSIS — J310 Chronic rhinitis: Secondary | ICD-10-CM

## 2021-05-26 DIAGNOSIS — R053 Chronic cough: Secondary | ICD-10-CM

## 2021-05-26 DIAGNOSIS — T781XXA Other adverse food reactions, not elsewhere classified, initial encounter: Secondary | ICD-10-CM

## 2021-05-26 MED ORDER — IPRATROPIUM BROMIDE 0.03 % NA SOLN: 2.0000 | Freq: Three times a day (TID) | NASAL | 12 refills | Status: DC | PRN

## 2021-05-26 NOTE — Patient Instructions (Addendum)
Chronic cough:  ?-Your breathing test today did not show obstruction ?- likely multiple factors contributing ?- will screen for allergies with blood work, and get you set up for skin testing (must be off antihistamines for 3 days prior to this appointment) ?-Continue Flonase 2 sprays in each nostril daily ?Add Atrovent (ipratropium) 2 sprays in each nostril up to 3 times daily as needed for drainage.  This can make you dry, so titrate as necessary. ?- continue allegra 180 mg daily (stop 3 days prior to skin testing appointment) ? ?Food allergy:  ?- please strictly avoid crab ?-We will test for this in the blood today, and will also verify with skin testing at next visit ? ?It was a pleasure meeting you in clinic today. ?Please follow-up for skin testing at your convenience ? ? ?

## 2021-05-26 NOTE — Progress Notes (Signed)
Endo  NEW PATIENT Date of Service/Encounter:  05/26/21 Referring provider: [name], DO Primary care provider: [name], DO  Subjective:  Katelyn Porter is a 71 y.o. female with a PMHx of hypertension, hyperlipidemia, GERD, breast cancer, tinnitus presenting today for evaluation of chronic cough. History obtained from: chart review and patient.  Cough: Initially started in October when she got sick, covid negative She was on prednisone and an assortment of cough medications, her cough remains. Cough is intermittent every day, dry cough, sometimes with clear phlegm. Cough happens when she eats, lays down and randomly throughout the day with both rest and activity. Associates: Occasional PND, itchy ears and eyes. Does not have a history of environmental allergies or asthma. She has had PFT 3 days ago, results do not show obstruction and do not show significant reversibility following bronchodilator. She has been prescribed symbicort in the past, but never picked it up at the suggestion of her primary. She started taking antihistamines about 3 weeks ago which she feels helped some. She takes pantoprazole 40 mg BID and doesn't feel it is helping. She uses flonase 2 SEN BID-TID PRN. Her abdomen is sore from coughing and she takes tylenol to relieve pain.  She does have an outdoor cat, and 2 horses but has had these for 15 years. No previous smoking history.  Crab: causes tongue blisters.  Eats shrimp without symptoms. Eats scallops occasionally without issues. Does not carry an Epipen.  Never been tested.  Previous diagnostics/pertinent history: She has been evaluated by Dr. Henrene Pastor with GI, taking pantoprazole 40 mg BID Has also been evaluted by ENT 04/24/21 with normal layrngoscopy  CT CHEST: 05/09/21: No acute intrathoracic pathology. No definite radiographic explanation for the patient's reported symptoms. Mild coronary artery calcification Aortic  Atherosclerosis CXR 04/30/21: Low lung volumes without acute abnormality or explanation for cough. Exercise stress echo 04/09/21: The patient had no chest pain. The patient achieved 93 % of maximum predicted heart rate. The METS achieved was 7.0. Exercise capacity was fair. Mild global hypokinesis, LVEF 40-43% at rest. There were no segmental wall motion abnormalities post exercise. The estimated LV ejection fraction improved modestly to 55-60% with stress. No obvious ischemia with reduced diagnostic accuracy.   Modified barium swallow study 03/14/2021: No significant abnormality identified. No evidence of laryngeal penetration or aspiration  Esophagram 02/25/21: Mild dysmotility, intact primary wave but proximal escape of approximately 30% of the ingested bolus with stasis of a moderate amount in the proximal esophagus. Patient experienced cough during this time without clear cause. No fixed narrowing.  No gastroesophageal reflux during observation.  Full PFTs: 05/23/2021: Pre-/post: FEV1/FVC ratio 80% pre-, 84% post; FEV1 101% pre-, 103% post, FVC 2.36 L pre, 2.29 L post-no obstruction and no significant improvement post bronchodilator Asthma: no Rhino conjunctivitis: no Medication allergy:  yes-sulfa headaches, lipitor - leg cramps Urticaria: no Eczema:no   Past Medical History: Past Medical History:  Diagnosis Date   Breast cancer ([place]) 2008   left, with radiation finished 08, tamoxifen   Breast cancer ([place]) 2019   Rt. had lymph node removed  having radiation currently   Complication of anesthesia    woke up with colonoscopy and hysterectomy   Depression 01/24/2013   Family history of breast cancer    Family history of colon cancer    GERD (gastroesophageal reflux disease)    Hyperlipidemia    Hypertension    IBS (irritable bowel syndrome)    Palpitations    Personal history  of radiation therapy 2007   left breast   Personal history of radiation therapy 2019   right breast    Vertigo    Medication List:  Current Outpatient Medications  Medication Sig Dispense Refill   Cholecalciferol (VITAMIN D3) 1000 units CAPS Take 1 capsule by mouth daily.     cloNIDine (CATAPRES) 0.1 MG tablet Take 0.1 mg by mouth at bedtime.     DULoxetine (CYMBALTA) 60 MG capsule Take 60 mg by mouth daily.      fluticasone (FLONASE) 50 MCG/ACT nasal spray Place 1 spray into both nostrils 2 (two) times daily.     Multiple Vitamins-Minerals (ONE-A-DAY WOMENS 50 PLUS PO) Take 1 tablet by mouth daily.     pantoprazole (PROTONIX) 40 MG tablet Take 1 tablet (40 mg total) by mouth 2 (two) times daily before a meal. 180 tablet 3   rosuvastatin (CRESTOR) 20 MG tablet Take 20 mg by mouth daily.     triamterene-hydrochlorothiazide (DYAZIDE) 37.5-25 MG capsule Take by mouth.     metoprolol succinate (TOPROL-XL) 25 MG 24 hr tablet Take 25 mg by mouth daily. (Patient not taking: Reported on 05/26/2021)     SYMBICORT 80-4.5 MCG/ACT inhaler Inhale into the lungs. (Patient not taking: Reported on 05/26/2021)     WELLBUTRIN SR 150 MG 12 hr tablet Take 150 mg by mouth daily. (Patient not taking: Reported on 05/26/2021)     No current facility-administered medications for this visit.   Known Allergies:  Allergies  Allergen Reactions   Crab [Shellfish Allergy] Swelling    Blisters on tongue.   Sulfa Antibiotics Other (See Comments)    headache   Lipitor [Atorvastatin]     Muscle cramping   Sulfa Drugs Cross Reactors    Past Surgical History: Past Surgical History:  Procedure Laterality Date   ABDOMINAL HYSTERECTOMY  1985   TAH (ovaries remain)   BREAST LUMPECTOMY Left 2007   BREAST LUMPECTOMY Right 2019   BREAST LUMPECTOMY WITH RADIOACTIVE SEED AND SENTINEL LYMPH NODE BIOPSY Right 07/22/2017   Procedure: BREAST LUMPECTOMY WITH RADIOACTIVE SEED AND SENTINEL LYMPH NODE BIOPSY;  Surgeon: Erroll Luna, MD;  Location: [facility];  Service: General;  Laterality: Right;   COLONOSCOPY      TONSILLECTOMY     age 51   Family History: Family History  Problem Relation Age of Onset   Colon cancer Mother 81   Hypertension Mother    Kidney disease Mother    Diabetes Mother    Lung cancer Father        Died at 40 from lung cancer   Heart disease Father        heart attack   Allergic rhinitis Sister    Hypertension Sister    Diabetes Sister    Colon polyps Sister    Asthma Brother    Coronary artery disease Brother 32       died of "massive heart attack"   Diabetes Brother    Breast cancer Maternal Aunt        dx in her 75s   Ovarian cancer Paternal Grandmother    Colon cancer Cousin        maternal 1st cousin dx over 3   Breast cancer Cousin        mat 1st cousin dx in her 44s   Esophageal cancer Neg Hx    Stomach cancer Neg Hx    Rectal cancer Neg Hx    Pancreatic cancer Neg Hx    Liver  disease Neg Hx    Social History: Jeorgia Helming lives in a house without water damage, hardwood floors, electric heating, no pets indoors, pet cat and horse outdoor, no cockroaches, not using dust mite protection, no smoke exposure.  She is retired since 2019; no HEPA filter in the home.  Exposed to dust with her hobbies.   ROS:  All other systems negative except as noted per HPI.  Objective:  Blood pressure 140/76, pulse 73, temperature 97.8 F (36.6 C), temperature source Temporal, resp. rate 18, height 5' 3.5" (1.613 m), weight 223 lb 3.2 oz (101.2 kg), last menstrual period 03/17/1983, SpO2 99 %. Body mass index is 38.92 kg/m. Physical Exam:  General Appearance:  Alert, cooperative, no distress, appears stated age  Head:  Normocephalic, without obvious abnormality, atraumatic  Eyes:  Conjunctiva clear, EOM's intact  Nose: Nares normal, normal mucosa and no visible anterior polyps  Throat: Lips, tongue normal; teeth and gums normal, normal posterior oropharynx  Neck: Supple, symmetrical  Lungs:   clear to auscultation bilaterally, Respirations unlabored, intermittent dry  coughing  Heart:  regular rate and rhythm and no murmur, Appears well perfused  Extremities: No edema  Skin: Skin color, texture, turgor normal, no rashes or lesions on visualized portions of skin  Neurologic: No gross deficits     Diagnostics: Spirometry:  Tracings reviewed. Her effort: Variable effort-results affected. FVC: 1.88L FEV1: 1.58L, 84% FEV1/FVC ratio: 106% Interpretation: No overt abnormalities noted given today's efforts, some flattening of inspiratory loop   Skin Testing: Deferred due to recent antihistamines use.   Assessment and Plan  Patient with chronic cough following respiratory illness in October 2022.  She denies a personal history of asthma or allergic rhinitis.  Has had extensive work-up including stress test, barium swallow studies, esophagram, chest CT, and full PFTs-none of which have identified source of her ongoing cough.  Her spirometry today did not show obstruction. Unable to do allergy testing because she has recently been taking antihistamines.  Will obtain in the blood as well as skin testing. Suspect that her cough is multifactorial in nature-likely postinflammatory from her previous infection, possible reflux and possible neurogenic component given the duration of symptoms She has pending work-up with pulmonary including consideration of bronchoscopy versus methacholine challenge versus trial of medication for neurogenic cough. She has been given a trial of inhaled steroids, but she reports never taking this medication which was prescribed by pulmonary. She does report some drainage which we will address with Atrovent nasal spray.  It would be odd to present at her age with worsening allergic rhinitis symptoms, but we will pursue testing to make sure that allergies, if present, are being fully addressed.  Additionally she has a history of tongue blistering with crab only.  She is able to tolerate other crustaceans and mollusks without symptoms.  We  will obtain lab testing for this as well and follow-up with skin testing.  If both negative would consider an oral challenge if she is interested  Chronic cough:  -Your breathing test today did not show obstruction - likely multiple factors contributing - will screen for allergies with blood work, and get you set up for skin testing (must be off antihistamines for 3 days prior to this appointment) -Continue Flonase 2 sprays in each nostril daily Add Atrovent (ipratropium) 2 sprays in each nostril up to 3 times daily as needed for drainage.  This can make you dry, so titrate as necessary. - continue allegra 180 mg daily (stop 3  days prior to skin testing appointment) -Encouraged patient to reach out to pulmonary regarding advice on use of pulmonary inhalers previously prescribed since she has never used these  Food allergy:  - please strictly avoid crab -We will test for this in the blood today, and will also verify with skin testing at next visit  It was a pleasure meeting you in clinic today. Please follow-up for skin testing at your convenience  This note in its entirety was forwarded to the Provider who requested this consultation.  Thank you for your kind referral. I appreciate the opportunity to take part in Stinnett Loretta's care. Please do not hesitate to contact me with questions.  Sincerely,  Sigurd Sos, MD [facility]

## 2021-05-28 LAB — ALLERGEN PROFILE, SHELLFISH
Clam IgE: <0.1 kU/L
F023-IgE Crab: <0.1 kU/L
F080-IgE Lobster: <0.1 kU/L
F290-IgE Oyster: <0.1 kU/L
Scallop IgE: <0.1 kU/L
Shrimp IgE: <0.1 kU/L

## 2021-05-28 LAB — ALLERGENS W/TOTAL IGE AREA 2

## 2021-05-28 NOTE — Progress Notes (Signed)
Please let Katelyn Porter know that her allergy environmental panel has returned and is negative.  Would not suspect allergies to be playing a role in her cough.  Since she already has a pulmonologist and a gastroenterologist, it would be best for her to follow-up with both of these for dual management of her cough. ? ?Additionally her shellfish panel was negative including to crab.  If she is interested in doing an oral challenge to crab in clinic we can set that up for her.  She would need to be off antihistamines for 3 days prior to this visit. ? ?Please let me know if she has any questions

## 2021-06-02 NOTE — Progress Notes (Signed)
? ?Synopsis: Referred for chronic cough by [name], DO ? ?Subjective:  ? ?PATIENT ID: Katelyn Porter GENDER: female DOB: [birth year], MRN: [id number] ? ?Chief Complaint  ?Patient presents with  ? Follow-up  ?  Increased cough- non prod, hoarseness x 2 wks. She has noticed that the cough is triggered by eating and it wakes her up every night. Sometimes she has sense of PND and has occ HA.   ? ?29yF with history of left breast cancer s/p XRT 2008, GERD seen for chronic cough. Never did have covid-19.  ? ?Cough has been going on since October. Saw Dr. Henrene Pastor with GI and has had a couple of swallow evals. Has been on pantoprazole 40 BID taking 30 min before meals. Only occasional sensation of postnasal drainage. She has developed some hoarseness.  ? ?She saw ENT 04/24/21, normal FNL. ? ?No DOE. Never had asthma as a kid.  ? ?Father had lung cancer ? ?She is retired from working in Press [employer]. Has never lived outside of [place]. She has a cat that lives outside, 2 horses. She does clean the stalls. Never smoked. Husband used to smoke.  ? ?Interval HPI: ?Seen 2/15 at which point obtained unremarkable CT Chest. PFTs with obstruction based on FEV1/SVC, mild in severity, no BD response.  ? ?Cough is again much worse when eating but she doesn't think she has trouble with dysphagia or choking while eating.  ? ? ?Otherwise pertinent review of systems is negative. ? ?Past Medical History:  ?Diagnosis Date  ? Breast cancer ([place] [facility]) 2008  ? left, with radiation finished 08, tamoxifen  ? Breast cancer ([place]) 2019  ? Rt. had lymph node removed  having radiation currently  ? Complication of anesthesia   ? woke up with colonoscopy and hysterectomy  ? Depression 01/24/2013  ? Family history of breast cancer   ? Family history of colon cancer   ? GERD (gastroesophageal reflux disease)   ? Hyperlipidemia   ? Hypertension   ? IBS (irritable bowel syndrome)   ? Palpitations   ? Personal history of radiation therapy 2007  ? left breast  ?  Personal history of radiation therapy 2019  ? right breast  ? Vertigo   ?  ? ?Family History  ?Problem Relation Age of Onset  ? Colon cancer Mother 8  ? Hypertension Mother   ? Kidney disease Mother   ? Diabetes Mother   ? Lung cancer Father   ?     Died at 33 from lung cancer  ? Heart disease Father   ?     heart attack  ? Allergic rhinitis Sister   ? Hypertension Sister   ? Diabetes Sister   ? Colon polyps Sister   ? Asthma Brother   ? Coronary artery disease Brother 4  ?     died of "massive heart attack"  ? Diabetes Brother   ? Breast cancer Maternal Aunt   ?     dx in her 60s  ? Ovarian cancer Paternal Grandmother   ? Colon cancer Cousin   ?     maternal 1st cousin dx over 33  ? Breast cancer Cousin   ?     mat 1st cousin dx in her 26s  ? Esophageal cancer Neg Hx   ? Stomach cancer Neg Hx   ? Rectal cancer Neg Hx   ? Pancreatic cancer Neg Hx   ? Liver disease Neg Hx   ?  ? ?Past Surgical History:  ?  Procedure Laterality Date  ? ABDOMINAL HYSTERECTOMY  1985  ? TAH (ovaries remain)  ? BREAST LUMPECTOMY Left 2007  ? BREAST LUMPECTOMY Right 2019  ? BREAST LUMPECTOMY WITH RADIOACTIVE SEED AND SENTINEL LYMPH NODE BIOPSY Right 07/22/2017  ? Procedure: BREAST LUMPECTOMY WITH RADIOACTIVE SEED AND SENTINEL LYMPH NODE BIOPSY;  Surgeon: Erroll Luna, MD;  Location: [facility];  Service: General;  Laterality: Right;  ? COLONOSCOPY    ? TONSILLECTOMY    ? age 71  ? ? ?Social History  ? ?Socioeconomic History  ? Marital status: Widowed  ?  Spouse name: Not on file  ? Number of children: 0  ? Years of education: Not on file  ? Highest education level: Not on file  ?Occupational History  ? Occupation: accounts payable  ?  Employer: [employer]  ?Tobacco Use  ? Smoking status: Never  ? Smokeless tobacco: Never  ?Vaping Use  ? Vaping Use: Never used  ?Substance and Sexual Activity  ? Alcohol use: No  ? Drug use: No  ? Sexual activity: Not Currently  ?  Birth control/protection: Surgical  ?  Comment:  hysterectomy  ?Other Topics Concern  ? Not on file  ?Social History Narrative  ? No children  ? Married  ? Enjoys quilting and horseback riding.   ? She works in Pension scheme manager.  ? She completed 2 years of college.   ? ?Social Determinants of Health  ? ?Financial Resource Strain: Not on file  ?Food Insecurity: Not on file  ?Transportation Needs: Not on file  ?Physical Activity: Not on file  ?Stress: Not on file  ?Social Connections: Not on file  ?Intimate Partner Violence: Not on file  ?  ? ?Allergies  ?Allergen Reactions  ? Crab [Shellfish Allergy] Swelling  ?  Blisters on tongue.  ? Sulfa Antibiotics Other (See Comments)  ?  headache  ? Lipitor [Atorvastatin]   ?  Muscle cramping  ? Sulfa Drugs Cross Reactors   ?  ? ?Outpatient Medications Prior to Visit  ?Medication Sig Dispense Refill  ? Cholecalciferol (VITAMIN D3) 1000 units CAPS Take 1 capsule by mouth daily.    ? cloNIDine (CATAPRES) 0.1 MG tablet Take 0.1 mg by mouth at bedtime.    ? DULoxetine (CYMBALTA) 60 MG capsule Take 60 mg by mouth daily.     ? fexofenadine (ALLEGRA) 180 MG tablet Take 180 mg by mouth daily.    ? fluticasone (FLONASE) 50 MCG/ACT nasal spray Place 1 spray into both nostrils 2 (two) times daily.    ? ipratropium (ATROVENT) 0.03 % nasal spray Place 2 sprays into both nostrils 3 (three) times daily as needed for rhinitis. 30 mL 12  ? metoprolol succinate (TOPROL-XL) 25 MG 24 hr tablet Take 25 mg by mouth daily.    ? Multiple Vitamins-Minerals (ONE-A-DAY WOMENS 50 PLUS PO) Take 1 tablet by mouth daily.    ? pantoprazole (PROTONIX) 40 MG tablet Take 1 tablet (40 mg total) by mouth 2 (two) times daily before a meal. 180 tablet 3  ? rosuvastatin (CRESTOR) 20 MG tablet Take 20 mg by mouth daily.    ? triamterene-hydrochlorothiazide (DYAZIDE) 37.5-25 MG capsule Take by mouth.    ? WELLBUTRIN SR 150 MG 12 hr tablet Take 150 mg by mouth daily.    ? SYMBICORT 80-4.5 MCG/ACT inhaler Inhale into the lungs. (Patient not taking: Reported on  05/26/2021)    ? ?No facility-administered medications prior to visit.  ? ? ? ? ? ?Objective:  ? ?  Physical Exam: ? ?General appearance: 71 y.o., female, NAD, conversant  ?Eyes: anicteric sclerae; PERRL, tracking appropriately ?HENT: NCAT; MMM ?Neck: Trachea midline; no lymphadenopathy, no JVD ?Lungs: CTAB, no crackles, no wheeze, with normal respiratory effort ?CV: RRR, no murmur  ?Abdomen: Soft, non-tender; non-distended, BS present  ?Extremities: No peripheral edema, warm ?Skin: Normal turgor and texture; no rash ?Psych: Appropriate affect ?Neuro: Alert and oriented to person and place, no focal deficit  ? ? ? ?Vitals:  ? 06/04/21 1117  ?BP: 134/74  ?Pulse: 84  ?Temp: 98.4 ?F (36.9 ?C)  ?TempSrc: Oral  ?SpO2: 97%  ?Weight: 220 lb (99.8 kg)  ?Height: 5' 3.5" (1.613 m)  ? ? ?97% on RA ?BMI Readings from Last 3 Encounters:  ?06/04/21 38.36 kg/m?  ?05/26/21 38.92 kg/m?  ?04/30/21 37.80 kg/m?  ? ?Wt Readings from Last 3 Encounters:  ?06/04/21 220 lb (99.8 kg)  ?05/26/21 223 lb 3.2 oz (101.2 kg)  ?04/30/21 220 lb 3.2 oz (99.9 kg)  ? ? ? ?CBC ?   ?Component Value Date/Time  ? WBC 5.6 01/20/2021 0947  ? WBC 6.4 01/18/2020 0941  ? RBC 4.69 01/20/2021 0947  ? HGB 14.2 01/20/2021 0947  ? HGB 13.1 06/01/2011 1353  ? HCT 42.6 01/20/2021 0947  ? HCT 39.0 06/01/2011 1353  ? PLT 265 01/20/2021 0947  ? PLT 269 06/01/2011 1353  ? MCV 90.8 01/20/2021 0947  ? MCV 90.4 06/01/2011 1353  ? MCH 30.3 01/20/2021 0947  ? MCHC 33.3 01/20/2021 0947  ? RDW 13.2 01/20/2021 0947  ? RDW 12.3 06/01/2011 1353  ? LYMPHSABS 2.0 01/20/2021 0947  ? LYMPHSABS 2.1 06/01/2011 1353  ? MONOABS 0.5 01/20/2021 0947  ? MONOABS 0.5 06/01/2011 1353  ? EOSABS 0.1 01/20/2021 0947  ? EOSABS 0.1 06/01/2011 1353  ? BASOSABS 0.0 01/20/2021 0947  ? BASOSABS 0.1 06/01/2011 1353  ? ? ?Eos 100-200 ? ?Chest Imaging: ? ?CT Chest 05/12/21 reviewed by me unremarkable ? ?CXR today reviewed by me unchanged, final read pending ? ?CXR 10/2018 reviewed by me unremarkable ? ?CT A/P  lung bases 2020 reviewed by me unremarkable ? ?Barium swallow study on 02/25/21 demonstrated  ?mild dysmotility, intact primary wave but proximal escape of approximately 30% of the ingested bolus with stasis of a

## 2021-06-03 ENCOUNTER — Ambulatory Visit: Payer: Medicare HMO | Admitting: Family

## 2021-06-04 ENCOUNTER — Telehealth: Payer: Self-pay | Admitting: Student

## 2021-06-04 ENCOUNTER — Ambulatory Visit: Payer: Medicare HMO | Admitting: Student

## 2021-06-04 ENCOUNTER — Encounter: Payer: Self-pay | Admitting: Student

## 2021-06-04 ENCOUNTER — Other Ambulatory Visit (HOSPITAL_COMMUNITY): Payer: Self-pay

## 2021-06-04 ENCOUNTER — Other Ambulatory Visit: Payer: Self-pay

## 2021-06-04 VITALS — BP 134/74 | HR 84 | Temp 98.4°F | Ht 63.5 in | Wt 220.0 lb

## 2021-06-04 DIAGNOSIS — R053 Chronic cough: Secondary | ICD-10-CM

## 2021-06-04 DIAGNOSIS — J449 Chronic obstructive pulmonary disease, unspecified: Secondary | ICD-10-CM | POA: Diagnosis not present

## 2021-06-04 DIAGNOSIS — R0683 Snoring: Secondary | ICD-10-CM

## 2021-06-04 NOTE — Telephone Encounter (Signed)
What laba/ics would be most affordable for her? ? ?Thanks! ? ?

## 2021-06-04 NOTE — Patient Instructions (Addendum)
-   Let's try an inhaler to see if it helps your cough ?- would keep up the flonase at least during pollen season ?- I will send a message to pharmacy to see what is most affordable for you and then send a my chart message to let you know I've sent a prescription to [facility] on [address] ?- Home sleep study you will be called to schedule ?- keep appointments with speech, ENT ?- see you in 3 months! ?

## 2021-06-04 NOTE — Telephone Encounter (Signed)
Plan prefers Symbicort, Advair & Breo.  Each have a $47 copay.  Patient can apply for financial assistance if needed. ?

## 2021-06-06 MED ORDER — ALBUTEROL SULFATE HFA 108 (90 BASE) MCG/ACT IN AERS: 2.0000 | INHALATION_SPRAY | Freq: Four times a day (QID) | RESPIRATORY_TRACT | 6 refills | Status: DC | PRN

## 2021-06-06 MED ORDER — BUDESONIDE-FORMOTEROL FUMARATE 160-4.5 MCG/ACT IN AERO: 2.0000 | INHALATION_SPRAY | Freq: Two times a day (BID) | RESPIRATORY_TRACT | 12 refills | Status: DC

## 2021-06-09 ENCOUNTER — Ambulatory Visit: Payer: Medicare HMO | Admitting: Family Medicine

## 2021-06-12 DIAGNOSIS — R490 Dysphonia: Secondary | ICD-10-CM | POA: Insufficient documentation

## 2021-06-12 DIAGNOSIS — J370 Chronic laryngitis: Secondary | ICD-10-CM | POA: Insufficient documentation

## 2021-06-12 DIAGNOSIS — J387 Other diseases of larynx: Secondary | ICD-10-CM

## 2021-06-12 HISTORY — DX: Other diseases of larynx: J38.7

## 2021-06-12 HISTORY — DX: Chronic laryngitis: J37.0

## 2021-06-12 HISTORY — DX: Dysphonia: R49.0

## 2021-06-18 DIAGNOSIS — R7301 Impaired fasting glucose: Secondary | ICD-10-CM | POA: Insufficient documentation

## 2021-06-18 HISTORY — DX: Impaired fasting glucose: R73.01

## 2021-07-09 ENCOUNTER — Other Ambulatory Visit: Payer: Self-pay | Admitting: Internal Medicine

## 2021-07-09 DIAGNOSIS — Z1231 Encounter for screening mammogram for malignant neoplasm of breast: Secondary | ICD-10-CM

## 2021-07-15 ENCOUNTER — Other Ambulatory Visit: Payer: Self-pay

## 2021-07-18 ENCOUNTER — Encounter: Payer: Self-pay | Admitting: Student

## 2021-07-18 ENCOUNTER — Ambulatory Visit (INDEPENDENT_AMBULATORY_CARE_PROVIDER_SITE_OTHER): Payer: Medicare HMO | Admitting: Nurse Practitioner

## 2021-07-18 ENCOUNTER — Encounter: Payer: Self-pay | Admitting: Nurse Practitioner

## 2021-07-18 VITALS — BP 160/60 | HR 91 | Ht 62.0 in | Wt 222.1 lb

## 2021-07-18 DIAGNOSIS — Z86010 Personal history of colonic polyps: Secondary | ICD-10-CM

## 2021-07-18 DIAGNOSIS — R490 Dysphonia: Secondary | ICD-10-CM | POA: Diagnosis not present

## 2021-07-18 DIAGNOSIS — R053 Chronic cough: Secondary | ICD-10-CM

## 2021-07-18 MED ORDER — ONDANSETRON 4 MG PO TBDP: ORAL_TABLET | ORAL | 0 refills | Status: DC

## 2021-07-18 MED ORDER — RABEPRAZOLE SODIUM 20 MG PO TBEC: 20.0000 mg | DELAYED_RELEASE_TABLET | Freq: Two times a day (BID) | ORAL | 1 refills | Status: DC

## 2021-07-18 MED ORDER — SUPREP BOWEL PREP KIT 17.5-3.13-1.6 GM/177ML PO SOLN: 1.0000 | ORAL | 0 refills | Status: DC

## 2021-07-18 NOTE — Progress Notes (Signed)
? ? ? ?07/18/2021 ?Zakia Sainato ?[id number] ?[birth year] ? ? ?Chief Complaint: Gradual colonoscopy, chronic cough ? ?History of Present Illness: Katelyn Porter is a 71 year old female with a past medical history of hypertension, hyperlipidemia, depression, breast cancer, chronic cough, GERD, IBS and colon polyps.  She presents her office today to schedule a follow-up colonoscopy.  She also noted she continues to have a chronic dry cough with voice hoarseness since 01/2021.  She was seen by pulmonology and she reported PFTs were normal.  Chest CT without contrast 05/09/2021 without evidence of pulmonary disease, lung mass or mediastinal lymphadenopathy.  Mild coronary artery calcification was noted.  She was prescribed an inhaler without significant improvement.  She was seen by ENT who prescribed Prednisone within the past year without improvement.  She underwent a follow-up appoint with her ENT a few weeks ago and she reported a laryngoscopy was done which showed evidence of yeast on her vocal cords. However, records from her ENT 04/24/2021 showed the following:  ? ?Flexible laryngoscopy shows patent anterior nasal cavity with minimal crusting, no discharge or infection. Normal base of tongue and supraglottis. Normal vocal cord mobility without vocal cord nodule, mass, polyp or tumor. Hypopharynx normal without mass, pooling of secretions or aspiration. Unremarkable exam. ? ?She remains on Flonase nasal spray twice daily.  She remains on Pantoprazole 40 mg twice daily.  She denies having any dysphagia or classic heartburn symptoms.  No upper or lower abdominal pain.  She is passing normal formed brown bowel movement daily.  Infrequent ibuprofen use. She underwent an EGD by Dr. Henrene Pastor 12/26/2020 which was normal.  Barium swallow 02/25/2021 showed mild dysmotility without evidence of a stricture or GERD.  He demonstrated active coughing during the exam without clear cause.  She underwent a modified swallow study  03/14/2021 without evidence of aspiration or oropharyngeal dysphagia. She underwent a colonoscopy 12/26/2020 which resulted in a poor bowel prep, diverticulosis in the sigmoid colon was noted.  She was advised to repeat a colonoscopy in 6 months with additional bowel prep.  She reported throwing up most of the bowel prep prior to her 12/2020 colonoscopy.  She reported awakening mid procedure during a colonoscopy 2012 in [place] (Versed and Demerol were administered).  No problems with MAC utilized during her 12/2020 EGD/colonoscopy.  ? ? ?  Latest Ref Rng & Units 01/20/2021  ?  9:47 AM 01/18/2020  ?  9:41 AM 10/11/2019  ? 11:35 AM  ?CBC  ?WBC 4.0 - 10.5 K/uL 5.6   6.4   5.1    ?Hemoglobin 12.0 - 15.0 g/dL 14.2   14.5   13.8    ?Hematocrit 36.0 - 46.0 % 42.6   44.3   41.6    ?Platelets 150 - 400 K/uL 265   251   220.0    ?  ? ?  Latest Ref Rng & Units 01/20/2021  ?  9:47 AM 01/18/2020  ?  9:41 AM 10/11/2019  ? 11:35 AM  ?CMP  ?Glucose 70 - 99 mg/dL 187   114     ?BUN 8 - 23 mg/dL 18   18     ?Creatinine 0.44 - 1.00 mg/dL 0.94   1.00     ?Sodium 135 - 145 mmol/L 139   141     ?Potassium 3.5 - 5.1 mmol/L 3.6   4.2     ?Chloride 98 - 111 mmol/L 107   105     ?CO2 22 - 32 mmol/L 23  30     ?Calcium 8.9 - 10.3 mg/dL 9.4   9.9     ?Total Protein 6.5 - 8.1 g/dL 6.8   7.2   6.8    ?Total Bilirubin 0.3 - 1.2 mg/dL 1.1   0.7   0.6    ?Alkaline Phos 38 - 126 U/L 79   91   73    ?AST 15 - 41 U/L _0 ?ALT 0 - 44 U/L 41   26   23    ?  ?EGD 12/26/2020: ?Unremarkable EGD. ? ?Colonoscopy 12/26/2020: ?- Preparation of the colon was poor. ?- Diverticulosis in the sigmoid colon. ?-Incomplete examination due to poor preparation. ? ?Colonoscopy 09/07/2017: ?- One 15 mm polyp in the sigmoid colon, removed with a hot snare. Resected and retrieved. ?- Three 2 to 8 mm polyps in the transverse colon, in the ascending colon and in the cecum, ?removed with a cold snare. Resected and retrieved. ?- Diverticulosis in the sigmoid colon. ?-  The examination was otherwise normal. ?1. Surgical [P], cecum, polyp ?- TUBULAR ADENOMA(S). ?- HIGH GRADE DYSPLASIA IS NOT IDENTIFIED. ?2. Surgical [P], ascending, polyp ?- TUBULAR ADENOMA(S). ?- HIGH GRADE DYSPLASIA IS NOT IDENTIFIED. ?3. Surgical [P], transverse, polyp ?- TUBULAR ADENOMA(S). ?- HIGH GRADE DYSPLASIA IS NOT IDENTIFIED. ?4. Surgical [P], sigmoid, polyp ?- TUBULAR ADENOMA(S). ?- HIGH GRADE DYSPLASIA IS NOT IDENTIFIED. ? ?Chest CT 05/09/2021: ?Cardiovascular: Mild coronary artery calcification. Global cardiac ?size within normal limits. No pericardial effusion. Central ?pulmonary arteries are of normal caliber. Minimal atherosclerotic ?calcification within the thoracic aorta. No aortic aneurysm. ?  ?Mediastinum/Nodes: No enlarged mediastinal or axillary lymph nodes. ?Thyroid gland, trachea, and esophagus demonstrate no significant ?findings. ?  ?Lungs/Pleura: Lungs are clear. No pleural effusion or pneumothorax. ?  ?Upper Abdomen: No acute abnormality. ?  ?Musculoskeletal: No acute bone abnormality. No lytic or blastic bone ?lesion. Multiple surgical clips are seen within the right breast. ?Right axillary lymph node dissection has been performed. ?  ?IMPRESSION: ?No acute intrathoracic pathology. No definite radiographic ?explanation for the patient's reported symptoms. ?  ?Mild coronary artery calcification ?  ?Aortic Atherosclerosis  ? ?Barium swallow 02/25/2021: ?Mild dysmotility, intact primary wave but proximal escape of ?approximately 30% of the ingested bolus with stasis of a moderate ?amount in the proximal esophagus. Patient experienced cough during ?this time without clear cause.  ?No fixed narrowing.  No gastroesophageal reflux during observation. ? ?Modified swallow study with speech pathologist 03/14/2021: ?She did have intermittent coughing and an episode of throat clearing during this study, but there was no associated aspiration or oropharyngeal dysphagia noted. Recommend that she  continue with regular solids and thin liquids as tolerated.  ? ? ?Past Medical History:  ?Diagnosis Date  ? Breast cancer ([place]) 2008  ? left, with radiation finished 08, tamoxifen  ? Breast cancer ([place]) 2019  ? Rt. had lymph node removed  having radiation currently  ? Complication of anesthesia   ? woke up with colonoscopy and hysterectomy  ? Depression 01/24/2013  ? Family history of breast cancer   ? Family history of colon cancer   ? GERD (gastroesophageal reflux disease)   ? Hyperlipidemia   ? Hypertension   ? IBS (irritable bowel syndrome)   ? Palpitations   ? Personal history of radiation therapy 2007  ? left breast  ? Personal history of radiation therapy 2019  ? right breast  ? Vertigo   ? ?Past Surgical History:  ?  Procedure Laterality Date  ? ABDOMINAL HYSTERECTOMY  1985  ? TAH (ovaries remain)  ? BREAST LUMPECTOMY Left 2007  ? BREAST LUMPECTOMY Right 2019  ? BREAST LUMPECTOMY WITH RADIOACTIVE SEED AND SENTINEL LYMPH NODE BIOPSY Right 07/22/2017  ? Procedure: BREAST LUMPECTOMY WITH RADIOACTIVE SEED AND SENTINEL LYMPH NODE BIOPSY;  Surgeon: Erroll Luna, MD;  Location: [facility];  Service: General;  Laterality: Right;  ? COLONOSCOPY    ? TONSILLECTOMY    ? age 16  ? ?Current Outpatient Medications on File Prior to Visit  ?Medication Sig Dispense Refill  ? albuterol (VENTOLIN HFA) 108 (90 Base) MCG/ACT inhaler Inhale 2 puffs into the lungs every 6 (six) hours as needed for wheezing or shortness of breath. 8 g 6  ? Cholecalciferol (VITAMIN D3) 1000 units CAPS Take 1 capsule by mouth daily.    ? cloNIDine (CATAPRES) 0.1 MG tablet Take 0.1 mg by mouth at bedtime.    ? DULoxetine (CYMBALTA) 60 MG capsule Take 60 mg by mouth daily.     ? fluticasone (FLONASE) 50 MCG/ACT nasal spray Place 1 spray into both nostrils 2 (two) times daily.    ? HYDROcodone bit-homatropine (HYCODAN) 5-1.5 MG/5ML syrup Take 5 mLs by mouth every 6 (six) hours as needed.    ? ipratropium (ATROVENT) 0.03 % nasal spray Place  2 sprays into both nostrils 3 (three) times daily as needed for rhinitis. 30 mL 12  ? Multiple Vitamins-Minerals (ONE-A-DAY WOMENS 50 PLUS PO) Take 1 tablet by mouth daily.    ? rosuvastatin (CRESTOR) 20 MG ta

## 2021-07-18 NOTE — Patient Instructions (Addendum)
You have been scheduled for a thyroid ultrasound at [facility] (1st floor of hospital) on Monday, 07/21/21 at 1:30 pm. Please arrive 15 minutes prior to your appointment for registration. Should you need to reschedule your appointment, please contact radiology at [phone]. This test typically takes about 30 minutes to perform. ? ?_____________________________________________ ?We have sent the following medications to your pharmacy for you to pick up at your convenience: ?Zofran 4 mg odt-Use as directed for colonoscopy prep ? ?Rabeprazole 20 mg twice daily ?_____________________________________________ ? ?Discontinue pantoprazole. ? ?_______________________________________________ ?You have been scheduled for a colonoscopy. Please follow written instructions given to you at your visit today.  ?Please pick up your prep supplies at the pharmacy within the next 1-3 days. ?If you use inhalers (even only as needed), please bring them with you on the day of your procedure. ? ?If you are age 75 or older, your body mass index should be between 23-30. Your Body mass index is 40.63 kg/m?Marland Kitchen If this is out of the aforementioned range listed, please consider follow up with your Primary Care Provider. ? ?If you are age 40 or younger, your body mass index should be between 19-25. Your Body mass index is 40.63 kg/m?Marland Kitchen If this is out of the aformentioned range listed, please consider follow up with your Primary Care Provider.  ? ?_____________________________________________________ ? ?The [place] providers would like to encourage you to use [facility] to communicate with providers for non-urgent requests or questions.  Due to long hold times on the telephone, sending your provider a message by [facility] may be a faster and more efficient way to get a response.  Please allow 48 business hours for a response.  Please remember that this is for non-urgent requests.  ?_____________________________________________________ ?Due to  recent changes in healthcare laws, you may see the results of your imaging and laboratory studies on MyChart before your provider has had a chance to review them.  We understand that in some cases there may be results that are confusing or concerning to you. Not all laboratory results come back in the same time frame and the provider may be waiting for multiple results in order to interpret others.  Please give [place] 48 hours in order for your provider to thoroughly review all the results before contacting the office for clarification of your results.  ? ?

## 2021-07-19 NOTE — Progress Notes (Signed)
Noted  

## 2021-07-21 ENCOUNTER — Ambulatory Visit (HOSPITAL_COMMUNITY): Payer: Medicare HMO

## 2021-07-22 ENCOUNTER — Ambulatory Visit: Payer: Medicare HMO | Admitting: Pulmonary Disease

## 2021-07-22 ENCOUNTER — Other Ambulatory Visit: Payer: Self-pay

## 2021-07-22 MED ORDER — GOLYTELY 236 G PO SOLR: 4000.0000 mL | Freq: Once | ORAL | 0 refills | Status: AC

## 2021-07-23 ENCOUNTER — Ambulatory Visit (HOSPITAL_COMMUNITY)
Admission: RE | Admit: 2021-07-23 | Discharge: 2021-07-23 | Disposition: A | Discharge status: Home / Self Care | Payer: Medicare HMO | Source: Ambulatory Visit | Attending: Nurse Practitioner | Admitting: Nurse Practitioner

## 2021-07-23 DIAGNOSIS — R490 Dysphonia: Secondary | ICD-10-CM | POA: Diagnosis present

## 2021-07-23 DIAGNOSIS — R053 Chronic cough: Secondary | ICD-10-CM | POA: Insufficient documentation

## 2021-07-29 ENCOUNTER — Ambulatory Visit
Admission: RE | Admit: 2021-07-29 | Discharge: 2021-07-29 | Disposition: A | Discharge status: Home / Self Care | Payer: Medicare HMO | Source: Ambulatory Visit | Attending: Internal Medicine | Admitting: Internal Medicine

## 2021-07-29 DIAGNOSIS — Z1231 Encounter for screening mammogram for malignant neoplasm of breast: Secondary | ICD-10-CM

## 2021-07-30 ENCOUNTER — Ambulatory Visit: Payer: Medicare HMO | Admitting: Internal Medicine

## 2021-07-31 ENCOUNTER — Encounter: Payer: Self-pay | Admitting: Internal Medicine

## 2021-08-13 ENCOUNTER — Encounter: Payer: Self-pay | Admitting: Internal Medicine

## 2021-08-13 ENCOUNTER — Ambulatory Visit (AMBULATORY_SURGERY_CENTER): Payer: Medicare HMO | Admitting: Internal Medicine

## 2021-08-13 VITALS — BP 197/59 | HR 87 | Temp 97.3°F | Resp 18 | Ht 62.0 in | Wt 222.0 lb

## 2021-08-13 DIAGNOSIS — Z86010 Personal history of colonic polyps: Secondary | ICD-10-CM

## 2021-08-13 DIAGNOSIS — D122 Benign neoplasm of ascending colon: Secondary | ICD-10-CM

## 2021-08-13 DIAGNOSIS — D120 Benign neoplasm of cecum: Secondary | ICD-10-CM | POA: Diagnosis not present

## 2021-08-13 DIAGNOSIS — D123 Benign neoplasm of transverse colon: Secondary | ICD-10-CM

## 2021-08-13 MED ORDER — SODIUM CHLORIDE 0.9 % IV SOLN: 500.0000 mL | Freq: Once | INTRAVENOUS | Status: DC

## 2021-08-13 NOTE — Progress Notes (Signed)
Called to room to assist during endoscopic procedure.  Patient ID and intended procedure confirmed with present staff. Received instructions for my participation in the procedure from the performing physician.  

## 2021-08-13 NOTE — Progress Notes (Signed)
Chief Complaint: Gradual colonoscopy, chronic cough   History of Present Illness: Katelyn Porter is a 71 year old female with a past medical history of hypertension, hyperlipidemia, depression, breast cancer, chronic cough, GERD, IBS and colon polyps.  She presents her office today to schedule a follow-up colonoscopy.  She also noted she continues to have a chronic dry cough with voice hoarseness since 01/2021.  She was seen by pulmonology and she reported PFTs were normal.  Chest CT without contrast 05/09/2021 without evidence of pulmonary disease, lung mass or mediastinal lymphadenopathy.  Mild coronary artery calcification was noted.  She was prescribed an inhaler without significant improvement.  She was seen by ENT who prescribed Prednisone within the past year without improvement.  She underwent a follow-up appoint with her ENT a few weeks ago and she reported a laryngoscopy was done which showed evidence of yeast on her vocal cords. However, records from her ENT 04/24/2021 showed the following:    Flexible laryngoscopy shows patent anterior nasal cavity with minimal crusting, no discharge or infection. Normal base of tongue and supraglottis. Normal vocal cord mobility without vocal cord nodule, mass, polyp or tumor. Hypopharynx normal without mass, pooling of secretions or aspiration. Unremarkable exam.   She remains on Flonase nasal spray twice daily.  She remains on Pantoprazole 40 mg twice daily.  She denies having any dysphagia or classic heartburn symptoms.  No upper or lower abdominal pain.  She is passing normal formed brown bowel movement daily.  Infrequent ibuprofen use. She underwent an EGD by Dr. Henrene Pastor 12/26/2020 which was normal.  Barium swallow 02/25/2021 showed mild dysmotility without evidence of a stricture or GERD.  He demonstrated active coughing during the exam without clear cause.  She underwent a modified swallow study 03/14/2021 without evidence of aspiration or oropharyngeal  dysphagia. She underwent a colonoscopy 12/26/2020 which resulted in a poor bowel prep, diverticulosis in the sigmoid colon was noted.  She was advised to repeat a colonoscopy in 6 months with additional bowel prep.  She reported throwing up most of the bowel prep prior to her 12/2020 colonoscopy.  She reported awakening mid procedure during a colonoscopy 2012 in [place] (Versed and Demerol were administered).  No problems with MAC utilized during her 12/2020 EGD/colonoscopy.        Latest Ref Rng & Units 01/20/2021    9:47 AM 01/18/2020    9:41 AM 10/11/2019   11:35 AM  CBC  WBC 4.0 - 10.5 K/uL 5.6   6.4   5.1    Hemoglobin 12.0 - 15.0 g/dL 14.2   14.5   13.8    Hematocrit 36.0 - 46.0 % 42.6   44.3   41.6    Platelets 150 - 400 K/uL 265   251   220.0          Latest Ref Rng & Units 01/20/2021    9:47 AM 01/18/2020    9:41 AM 10/11/2019   11:35 AM  CMP  Glucose 70 - 99 mg/dL 187   114      BUN 8 - 23 mg/dL 18   18      Creatinine 0.44 - 1.00 mg/dL 0.94   1.00      Sodium 135 - 145 mmol/L 139   141      Potassium 3.5 - 5.1 mmol/L 3.6   4.2      Chloride 98 - 111 mmol/L 107   105      CO2 22 - 32 mmol/L 23  30      Calcium 8.9 - 10.3 mg/dL 9.4   9.9      Total Protein 6.5 - 8.1 g/dL 6.8   7.2   6.8    Total Bilirubin 0.3 - 1.2 mg/dL 1.1   0.7   0.6    Alkaline Phos 38 - 126 U/L 79   91   73    AST 15 - 41 U/L _0 ALT 0 - 44 U/L 41   26   23      EGD 12/26/2020: Unremarkable EGD.   Colonoscopy 12/26/2020: - Preparation of the colon was poor. - Diverticulosis in the sigmoid colon. -Incomplete examination due to poor preparation.   Colonoscopy 09/07/2017: - One 15 mm polyp in the sigmoid colon, removed with a hot snare. Resected and retrieved. - Three 2 to 8 mm polyps in the transverse colon, in the ascending colon and in the cecum, removed with a cold snare. Resected and retrieved. - Diverticulosis in the sigmoid colon. - The examination was otherwise normal. 1.  Surgical [P], cecum, polyp - TUBULAR ADENOMA(S). - HIGH GRADE DYSPLASIA IS NOT IDENTIFIED. 2. Surgical [P], ascending, polyp - TUBULAR ADENOMA(S). - HIGH GRADE DYSPLASIA IS NOT IDENTIFIED. 3. Surgical [P], transverse, polyp - TUBULAR ADENOMA(S). - HIGH GRADE DYSPLASIA IS NOT IDENTIFIED. 4. Surgical [P], sigmoid, polyp - TUBULAR ADENOMA(S). - HIGH GRADE DYSPLASIA IS NOT IDENTIFIED.   Chest CT 05/09/2021: Cardiovascular: Mild coronary artery calcification. Global cardiac size within normal limits. No pericardial effusion. Central pulmonary arteries are of normal caliber. Minimal atherosclerotic calcification within the thoracic aorta. No aortic aneurysm.   Mediastinum/Nodes: No enlarged mediastinal or axillary lymph nodes. Thyroid gland, trachea, and esophagus demonstrate no significant findings.   Lungs/Pleura: Lungs are clear. No pleural effusion or pneumothorax.   Upper Abdomen: No acute abnormality.   Musculoskeletal: No acute bone abnormality. No lytic or blastic bone lesion. Multiple surgical clips are seen within the right breast. Right axillary lymph node dissection has been performed.   IMPRESSION: No acute intrathoracic pathology. No definite radiographic explanation for the patient's reported symptoms.   Mild coronary artery calcification   Aortic Atherosclerosis    Barium swallow 02/25/2021: Mild dysmotility, intact primary wave but proximal escape of approximately 30% of the ingested bolus with stasis of a moderate amount in the proximal esophagus. Patient experienced cough during this time without clear cause.  No fixed narrowing.  No gastroesophageal reflux during observation.   Modified swallow study with speech pathologist 03/14/2021: She did have intermittent coughing and an episode of throat clearing during this study, but there was no associated aspiration or oropharyngeal dysphagia noted. Recommend that she continue with regular solids and thin liquids  as tolerated.          Past Medical History:  Diagnosis Date   Breast cancer ([place]) 2008    left, with radiation finished 08, tamoxifen   Breast cancer ([place]) 2019    Rt. had lymph node removed  having radiation currently   Complication of anesthesia      woke up with colonoscopy and hysterectomy   Depression 01/24/2013   Family history of breast cancer     Family history of colon cancer     GERD (gastroesophageal reflux disease)     Hyperlipidemia     Hypertension     IBS (irritable bowel syndrome)     Palpitations     Personal history of radiation therapy 2007  left breast   Personal history of radiation therapy 2019    right breast   Vertigo           Past Surgical History:  Procedure Laterality Date   ABDOMINAL HYSTERECTOMY   1985    TAH (ovaries remain)   BREAST LUMPECTOMY Left 2007   BREAST LUMPECTOMY Right 2019   BREAST LUMPECTOMY WITH RADIOACTIVE SEED AND SENTINEL LYMPH NODE BIOPSY Right 07/22/2017    Procedure: BREAST LUMPECTOMY WITH RADIOACTIVE SEED AND SENTINEL LYMPH NODE BIOPSY;  Surgeon: Erroll Luna, MD;  Location: [facility];  Service: General;  Laterality: Right;   COLONOSCOPY       TONSILLECTOMY        age 24          Current Outpatient Medications on File Prior to Visit  Medication Sig Dispense Refill   albuterol (VENTOLIN HFA) 108 (90 Base) MCG/ACT inhaler Inhale 2 puffs into the lungs every 6 (six) hours as needed for wheezing or shortness of breath. 8 g 6   Cholecalciferol (VITAMIN D3) 1000 units CAPS Take 1 capsule by mouth daily.       cloNIDine (CATAPRES) 0.1 MG tablet Take 0.1 mg by mouth at bedtime.       DULoxetine (CYMBALTA) 60 MG capsule Take 60 mg by mouth daily.        fluticasone (FLONASE) 50 MCG/ACT nasal spray Place 1 spray into both nostrils 2 (two) times daily.       HYDROcodone bit-homatropine (HYCODAN) 5-1.5 MG/5ML syrup Take 5 mLs by mouth every 6 (six) hours as needed.       ipratropium (ATROVENT) 0.03 % nasal  spray Place 2 sprays into both nostrils 3 (three) times daily as needed for rhinitis. 30 mL 12   Multiple Vitamins-Minerals (ONE-A-DAY WOMENS 50 PLUS PO) Take 1 tablet by mouth daily.       rosuvastatin (CRESTOR) 20 MG tablet Take 20 mg by mouth daily.       triamterene-hydrochlorothiazide (DYAZIDE) 37.5-25 MG capsule Take by mouth.       WELLBUTRIN SR 150 MG 12 hr tablet Take 150 mg by mouth daily.        No current facility-administered medications on file prior to visit.         Allergies  Allergen Reactions   Crab [Shellfish Allergy] Swelling      Blisters on tongue.   Sulfa Antibiotics Other (See Comments)      headache   Lipitor [Atorvastatin]        Muscle cramping   Sulfa Drugs Cross Reactors      Current Medications, Allergies, Past Medical History, Past Surgical History, Family History and Social History were reviewed in [employer] record.   Review of Systems:   Constitutional: Negative for fever, sweats, chills or weight loss.  Respiratory: Negative for shortness of breath.   Cardiovascular: Negative for chest pain, palpitations and leg swelling.  Gastrointestinal: See HPI.  Musculoskeletal: Negative for back pain or muscle aches.  Neurological: Negative for dizziness, headaches or paresthesias.      Physical Exam: LMP 03/17/1983  BP (!) 160/60   Pulse 91   Ht _0  (1.575 m)   Wt 222 lb 2 oz (100.8 kg)   LMP 03/17/1983   BMI 40.63 kg/m    General: 71 year old female in NAD.  Head: Normocephalic and atraumatic. Eyes: No scleral icterus. Conjunctiva pink . Ears: Normal auditory acuity. Mouth: Dentition intact. No ulcers or lesions.  Neck:  Questionable small right thyroid nodule.  Lungs: Clear throughout to auscultation. Heart: Regular rate and rhythm, no murmur. Abdomen: Soft, nontender and nondistended. No masses or hepatomegaly. Normal bowel sounds x 4 quadrants.  Rectal: Deferred.  Musculoskeletal: Symmetrical with no gross  deformities. Extremities: No edema. Neurological: Alert oriented x 4. No focal deficits.  Psychological: Alert and cooperative. Normal mood and affect   Assessment and Recommendations:   81) 71 year old female with a history of tubular adenomatous.  Mother with history of colon cancer.  Her most recent colonoscopy 08/2017 identified one 40m and three 2 - 8 mm TA polyps removed from the colon. Follow-up colonoscopy 12/26/2020 resulted in a poor bowel prep (patient reported a vomiting most of the bowel prep) which resulted in poor preparation of the colon, diverticulosis in the sigmoid colon was noted.  A repeat colonoscopy in 6 months was recommended. -Colonoscopy benefits and risks discussed including risk with sedation, risk of bleeding, perforation and infection  -2 day colonoscopy bowel prep -Ondansetron ODT 1 tab to be taken 30 minutes prior to each bowel prep dose -Further recommendations to be determined after colonoscopy completed   2) Chronic dry cough, unclear etiology. GI, ENT and pulmonary evaluations did not identify etiology for her cough.  -Thyroid sonogram to rule out possible small right thyroid nodule -Stop Pantoprazole -Trial with Rabeprazole 20 mg p.o. twice daily -[facility]

## 2021-08-13 NOTE — Progress Notes (Signed)
Vitals-DT 

## 2021-08-13 NOTE — Progress Notes (Signed)
Report to PACU, RN, vss, BBS= Clear.  

## 2021-08-13 NOTE — Patient Instructions (Signed)
Handouts given on diverticulosis and polyps.  Await pathology results.  Resume previous diet and current medications.     YOU HAD AN ENDOSCOPIC PROCEDURE TODAY AT [facility]:   Refer to the procedure report that was given to you for any specific questions about what was found during the examination.  If the procedure report does not answer your questions, please call your gastroenterologist to clarify.  If you requested that your care partner not be given the details of your procedure findings, then the procedure report has been included in a sealed envelope for you to review at your convenience later.  YOU SHOULD EXPECT: Some feelings of bloating in the abdomen. Passage of more gas than usual.  Walking can help get rid of the air that was put into your GI tract during the procedure and reduce the bloating. If you had a lower endoscopy (such as a colonoscopy or flexible sigmoidoscopy) you may notice spotting of blood in your stool or on the toilet paper. If you underwent a bowel prep for your procedure, you may not have a normal bowel movement for a few days.  Please Note:  You might notice some irritation and congestion in your nose or some drainage.  This is from the oxygen used during your procedure.  There is no need for concern and it should clear up in a day or so.  SYMPTOMS TO REPORT IMMEDIATELY:  Following lower endoscopy (colonoscopy or flexible sigmoidoscopy):  Excessive amounts of blood in the stool  Significant tenderness or worsening of abdominal pains  Swelling of the abdomen that is new, acute  Fever of 100F or higher  For urgent or emergent issues, a gastroenterologist can be reached at any hour by calling [phone]. Do not use MyChart messaging for urgent concerns.    DIET:  We do recommend a small meal at first, but then you may proceed to your regular diet.  Drink plenty of fluids but you should avoid alcoholic beverages for 24 hours.  ACTIVITY:  You  should plan to take it easy for the rest of today and you should NOT DRIVE or use heavy machinery until tomorrow (because of the sedation medicines used during the test).    FOLLOW UP: Our staff will call the number listed on your records 48-72 hours following your procedure to check on you and address any questions or concerns that you may have regarding the information given to you following your procedure. If we do not reach you, we will leave a message.  We will attempt to reach you two times.  During this call, we will ask if you have developed any symptoms of COVID 19. If you develop any symptoms (ie: fever, flu-like symptoms, shortness of breath, cough etc.) before then, please call [phone].  If you test positive for Covid 19 in the 2 weeks post procedure, please call and report this information to [place].    If any biopsies were taken you will be contacted by phone or by letter within the next 1-3 weeks.  Please call us at [phone] if you have not heard about the biopsies in 3 weeks.    SIGNATURES/CONFIDENTIALITY: You and/or your care partner have signed paperwork which will be entered into your electronic medical record.  These signatures attest to the fact that that the information above on your After Visit Summary has been reviewed and is understood.  Full responsibility of the confidentiality of this discharge information lies with you and/or your  care-partner.  

## 2021-08-13 NOTE — Progress Notes (Signed)
Approx 1623, pt started spontaneously coughing up what looked to be nasal drainage type fluid.  They did abd pressure to get to the cecum, to stay in the cecum, and snare a polyp.  Suctioning started and pt aroused fairly quickly following commands to swallow so HOB was not dropped.  Pt allowed to wake up a little more so that I could suction further back and she could cough/swallow to clear her own airway.  Zofran and Robinul given.  Light sedation restarted since we had more prcedure left

## 2021-08-13 NOTE — Op Note (Signed)
[facility] Patient Name: Katelyn Porter Procedure Date: 08/13/2021 4:03 PM MRN: [id number] Endoscopist: Docia Chuck. Henrene Pastor , MD Age: 71 Referring MD:  Date of Birth: 1950/12/24 Gender: Female Account #: [id number] Procedure:                Colonoscopy with cold snare polypectomy x 4 Indications:              High risk colon cancer surveillance: Personal                            history of adenoma (10 mm or greater in size), High                            risk colon cancer surveillance: Personal history of                            multiple (3 or more) adenomas. Previous                            examinations 2012 (elsewhere), 2019, 2022 (poor                            prep). Mother with colon cancer greater than 57.                            Niece with colon cancer. Medicines:                Monitored Anesthesia Care Procedure:                Pre-Anesthesia Assessment:                           - Prior to the procedure, a History and Physical                            was performed, and patient medications and                            allergies were reviewed. The patient's tolerance of                            previous anesthesia was also reviewed. The risks                            and benefits of the procedure and the sedation                            options and risks were discussed with the patient.                            All questions were answered, and informed consent                            was obtained. Prior Anticoagulants: The patient has  taken no previous anticoagulant or antiplatelet                            agents. ASA Grade Assessment: II - A patient with                            mild systemic disease. After reviewing the risks                            and benefits, the patient was deemed in                            satisfactory condition to undergo the procedure.                           After  obtaining informed consent, the colonoscope                            was passed under direct vision. Throughout the                            procedure, the patient's blood pressure, pulse, and                            oxygen saturations were monitored continuously. The                            Olympus CF-HQ190L ([id number]) Colonoscope was                            introduced through the anus and advanced to the the                            cecum, identified by appendiceal orifice and                            ileocecal valve. The ileocecal valve, appendiceal                            orifice, and rectum were photographed. The quality                            of the bowel preparation was good. The colonoscopy                            was performed without difficulty. The patient                            tolerated the procedure well. The bowel preparation                            used was 2-day preparation including SUPREP via  split dose instruction. Scope In: 4:14:33 PM Scope Out: 1:60:73 PM Scope Withdrawal Time: 0 hours 25 minutes 11 seconds  Total Procedure Duration: 0 hours 31 minutes 38 seconds  Findings:                 Four polyps were found in the transverse colon,                            ascending colon and cecum. The polyps were 1 to 4                            mm in size. These polyps were removed with a cold                            snare. Resection and retrieval were complete.                           Multiple diverticula were found in the sigmoid                            colon.                           The exam was otherwise without abnormality on                            direct and retroflexion views. Complications:            No immediate complications. Estimated blood loss:                            None. Estimated Blood Loss:     Estimated blood loss: none. Impression:               - Four 1 to 4 mm polyps in  the transverse colon, in                            the ascending colon and in the cecum, removed with                            a cold snare. Resected and retrieved.                           - Diverticulosis in the sigmoid colon.                           - The examination was otherwise normal on direct                            and retroflexion views. Recommendation:           - Repeat colonoscopy in 5 years for surveillance                            (2-day PREP).                           -  Patient has a contact number available for                            emergencies. The signs and symptoms of potential                            delayed complications were discussed with the                            patient. Return to normal activities tomorrow.                            Written discharge instructions were provided to the                            patient.                           - Resume previous diet.                           - Continue present medications.                           - Await pathology results. Docia Chuck. Henrene Pastor, MD 08/13/2021 4:51:59 PM This report has been signed electronically.

## 2021-08-14 ENCOUNTER — Telehealth: Payer: Self-pay

## 2021-08-14 NOTE — Telephone Encounter (Signed)
  Follow up Call-     08/13/2021    2:57 PM 12/26/2020    7:09 AM 10/13/2019    2:43 PM  Call back number  Post procedure Call Back phone  # [phone]  Permission to leave phone message Yes Yes Yes     Patient questions:  Do you have a fever, pain , or abdominal swelling? No. Pain Score  0 *  Have you tolerated food without any problems? Yes.    Have you been able to return to your normal activities? Yes.    Do you have any questions about your discharge instructions: Diet   No. Medications  No. Follow up visit  No.  Do you have questions or concerns about your Care? No.  Actions: * If pain score is 4 or above: No action needed, pain <4.

## 2021-08-19 ENCOUNTER — Encounter: Payer: Self-pay | Admitting: Internal Medicine

## 2021-09-02 NOTE — Progress Notes (Deleted)
Synopsis: Referred for chronic cough by [name], DO  Subjective:   PATIENT ID: Katelyn Porter GENDER: female DOB: 11-May-1950, MRN: [id number]  No chief complaint on file.  71yF with history of left breast cancer s/p XRT 2008, GERD seen for chronic cough. Never did have covid-19.   Cough has been going on since October. Saw Dr. Henrene Pastor with GI and has had a couple of swallow evals. Has been on pantoprazole 40 BID taking 30 min before meals. Only occasional sensation of postnasal drainage. She has developed some hoarseness.   She saw ENT 04/24/21, normal FNL.  No DOE. Never had asthma as a kid.   Father had lung cancer  She is retired from working in Press photographer. Has never lived outside of [place]. She has a cat that lives outside, 2 horses. She does clean the stalls. Never smoked. Husband used to smoke.   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 Rowe Clack at [facility] seen 07/22/21 and given course of diflucan again for canddia laryngitis, cough suppression tx revisited  Otherwise pertinent review of systems is negative.  Past Medical History:  Diagnosis Date   Breast cancer ([place]) 2008   left, with radiation finished 08, tamoxifen   Breast cancer ([place]) 2019   Rt. had lymph node removed  having radiation currently   Complication of anesthesia    woke up with colonoscopy and hysterectomy   Depression 01/24/2013   Family history of breast cancer    Family history of colon cancer    GERD (gastroesophageal reflux disease)    Hyperlipidemia    Hypertension    IBS (irritable bowel syndrome)    Palpitations    Personal history of radiation therapy 2007   left breast   Personal history of radiation therapy 2019   right breast   Vertigo      Family History  Problem Relation Age of Onset   Colon cancer Mother 90   Hypertension Mother    Kidney disease Mother     Diabetes Mother    Lung cancer Father        Died at 59 from lung cancer   Heart disease Father        heart attack   Allergic rhinitis Sister    Hypertension Sister    Diabetes Sister    Colon polyps Sister    Asthma Brother    Coronary artery disease Brother 57       died of "massive heart attack"   Diabetes Brother    Breast cancer Maternal Aunt        dx in her 80s   Ovarian cancer Paternal Grandmother    Colon cancer Cousin        maternal 1st cousin dx over 38   Breast cancer Cousin        mat 1st cousin dx in her 7s   Esophageal cancer Neg Hx    Stomach cancer Neg Hx    Rectal cancer Neg Hx    Pancreatic cancer Neg Hx    Liver disease Neg Hx      Past Surgical History:  Procedure Laterality Date   ABDOMINAL HYSTERECTOMY  1985   TAH (ovaries remain)   BREAST LUMPECTOMY Left 2007   BREAST LUMPECTOMY Right 2019   BREAST LUMPECTOMY WITH RADIOACTIVE SEED AND SENTINEL LYMPH NODE BIOPSY Right 07/22/2017   Procedure: BREAST LUMPECTOMY WITH  RADIOACTIVE SEED AND SENTINEL LYMPH NODE BIOPSY;  Surgeon: Erroll Luna, MD;  Location: [facility];  Service: General;  Laterality: Right;   COLONOSCOPY     TONSILLECTOMY     age 71    Social History   Socioeconomic History   Marital status: Widowed    Spouse name: Not on file   Number of children: 0   Years of education: Not on file   Highest education level: Not on file  Occupational History   Occupation: accounts payable    Employer: [employer]  Tobacco Use   Smoking status: Never   Smokeless tobacco: Never  Vaping Use   Vaping Use: Never used  Substance and Sexual Activity   Alcohol use: No   Drug use: No   Sexual activity: Not Currently    Birth control/protection: Surgical    Comment: hysterectomy  Other Topics Concern   Not on file  Social History Narrative   No children   Married   Enjoys quilting and horseback riding.    She works in Pension scheme manager.   She completed 2 years  of college.    Social Determinants of Health   Financial Resource Strain: Not on file  Food Insecurity: Not on file  Transportation Needs: Not on file  Physical Activity: Not on file  Stress: Not on file  Social Connections: Not on file  Intimate Partner Violence: Not on file     Allergies  Allergen Reactions   Crab [Shellfish Allergy] Swelling    Blisters on tongue.   Sulfa Antibiotics Other (See Comments)    headache   Lipitor [Atorvastatin]     Muscle cramping   Sulfa Drugs Cross Reactors      Outpatient Medications Prior to Visit  Medication Sig Dispense Refill   albuterol (VENTOLIN HFA) 108 (90 Base) MCG/ACT inhaler Inhale 2 puffs into the lungs every 6 (six) hours as needed for wheezing or shortness of breath. 8 g 6   Cholecalciferol (VITAMIN D3) 1000 units CAPS Take 1 capsule by mouth daily.     cloNIDine (CATAPRES) 0.1 MG tablet Take 0.1 mg by mouth at bedtime.     DULoxetine (CYMBALTA) 60 MG capsule Take 60 mg by mouth daily.      fluticasone (FLONASE) 50 MCG/ACT nasal spray Place 1 spray into both nostrils 2 (two) times daily.     ipratropium (ATROVENT) 0.03 % nasal spray Place 2 sprays into both nostrils 3 (three) times daily as needed for rhinitis. 30 mL 12   Multiple Vitamins-Minerals (ONE-A-DAY WOMENS 50 PLUS PO) Take 1 tablet by mouth daily.     ondansetron (ZOFRAN-ODT) 4 MG disintegrating tablet Use as directed for colonoscopy prep 4 tablet 0   RABEprazole (ACIPHEX) 20 MG tablet Take 1 tablet (20 mg total) by mouth in the morning and at bedtime. (Patient not taking: Reported on 08/13/2021) 60 tablet 1   rosuvastatin (CRESTOR) 20 MG tablet Take 20 mg by mouth daily.     triamterene-hydrochlorothiazide (DYAZIDE) 37.5-25 MG capsule Take by mouth.     WELLBUTRIN SR 150 MG 12 hr tablet Take 150 mg by mouth daily.     No facility-administered medications prior to visit.       Objective:   Physical Exam:  General appearance: 71 y.o., female, NAD, conversant   Eyes: anicteric sclerae; PERRL, tracking appropriately HENT: NCAT; MMM Neck: Trachea midline; no lymphadenopathy, no JVD Lungs: CTAB, no crackles, no wheeze, with normal respiratory effort CV: RRR, no murmur  Abdomen: Soft, non-tender; non-distended, BS present  Extremities: No peripheral edema, warm Skin: Normal turgor and texture; no rash Psych: Appropriate affect Neuro: Alert and oriented to person and place, no focal deficit     There were no vitals filed for this visit.     on RA BMI Readings from Last 3 Encounters:  08/13/21 40.60 kg/m  07/18/21 40.63 kg/m  06/04/21 38.36 kg/m   Wt Readings from Last 3 Encounters:  08/13/21 222 lb (100.7 kg)  07/18/21 222 lb 2 oz (100.8 kg)  06/04/21 220 lb (99.8 kg)     CBC    Component Value Date/Time   WBC 5.6 01/20/2021 0947   WBC 6.4 01/18/2020 0941   RBC 4.69 01/20/2021 0947   HGB 14.2 01/20/2021 0947   HGB 13.1 06/01/2011 1353   HCT 42.6 01/20/2021 0947   HCT 39.0 06/01/2011 1353   PLT 265 01/20/2021 0947   PLT 269 06/01/2011 1353   MCV 90.8 01/20/2021 0947   MCV 90.4 06/01/2011 1353   MCH 30.3 01/20/2021 0947   MCHC 33.3 01/20/2021 0947   RDW 13.2 01/20/2021 0947   RDW 12.3 06/01/2011 1353   LYMPHSABS 2.0 01/20/2021 0947   LYMPHSABS 2.1 06/01/2011 1353   MONOABS 0.5 01/20/2021 0947   MONOABS 0.5 06/01/2011 1353   EOSABS 0.1 01/20/2021 0947   EOSABS 0.1 06/01/2011 1353   BASOSABS 0.0 01/20/2021 0947   BASOSABS 0.1 06/01/2011 1353    Eos 100-200  Chest Imaging:  CT Chest 05/12/21 reviewed by me unremarkable  CXR today reviewed by me unchanged, final read pending  CXR 10/2018 reviewed by me unremarkable  CT A/P lung bases 2020 reviewed by me unremarkable  Barium swallow study on 02/25/21 demonstrated  mild dysmotility, intact primary wave but proximal escape of approximately 30% of the ingested bolus with stasis of a moderate amount in the proximal esophagus. Patient experienced cough during this  time without clear cause.   No fixed narrowing.  No gastroesophageal reflux during observation.   Modified barium swallow study performed on 03/14/21 demonstrated no significant abnormality identified. No evidence of laryngeal penetration or aspiration  FNL by ENT 04/24/21: Flexible laryngoscopy shows patent anterior nasal cavity with minimal crusting, no discharge or infection.  Normal base of tongue and supraglottis Normal vocal cord mobility without vocal cord nodule, mass, polyp or tumor. Hypopharynx normal without mass, pooling of secretions or aspiration. Unremarkable exam.   Pulmonary Functions Testing Results:    Latest Ref Rng & Units 05/23/2021    3:34 PM  PFT Results  FVC-Pre L 2.36   FVC-Predicted Pre % 98   FVC-Post L 2.29   FVC-Predicted Post % 95   Pre FEV1/FVC % % 80   Post FEV1/FCV % % 84   FEV1-Pre L 1.90   FEV1-Predicted Pre % 101   FEV1-Post L 1.93   DLCO uncorrected ml/min/mmHg 17.40   DLCO UNC% % 89   DLCO corrected ml/min/mmHg 17.40   DLCO COR %Predicted % 89   DLVA Predicted % 120   TLC L 4.26   TLC % Predicted % 84   RV % Predicted % 76    Mild obstruction by FEV1/SVC, no BD response     Assessment & Plan:   # Mild obstruction without BD reversibility Perhaps longstanding asthma vs mild COPD from second hand smoke exposure.   # Chronic cough:  Has made great effort to address any components of GERD, post nasal drainage. Unclear etiology - PFT raises question as to  whether untreated asthma/obstructive lung disease could be contributory. Her trouble with cough when singing or needing to use voice a lot, hoarseness could suggest a functional/vocal cord dysfunction issue.   # Snoring  Plan: - trial of ICS/LABA will send message to pharmacy to see what is most affordable - agree with ongoing evaluation by speech/voice clinic at [facility] and follow up with ENT - continue daily flonase, BID ppi - home sleep study   RTC 3 months   Maryjane Hurter,  MD [facility] 09/02/2021 6:32 PM

## 2021-09-04 ENCOUNTER — Ambulatory Visit: Payer: Medicare HMO | Admitting: Student

## 2021-09-23 ENCOUNTER — Ambulatory Visit (HOSPITAL_BASED_OUTPATIENT_CLINIC_OR_DEPARTMENT_OTHER): Payer: Medicare HMO | Admitting: Obstetrics & Gynecology

## 2021-09-25 ENCOUNTER — Encounter (HOSPITAL_BASED_OUTPATIENT_CLINIC_OR_DEPARTMENT_OTHER): Payer: Self-pay | Admitting: Obstetrics & Gynecology

## 2021-09-25 ENCOUNTER — Ambulatory Visit (INDEPENDENT_AMBULATORY_CARE_PROVIDER_SITE_OTHER): Payer: Medicare HMO | Admitting: Obstetrics & Gynecology

## 2021-09-25 VITALS — BP 139/54 | HR 80 | Ht 64.0 in | Wt 221.6 lb

## 2021-09-25 DIAGNOSIS — Z9189 Other specified personal risk factors, not elsewhere classified: Secondary | ICD-10-CM | POA: Diagnosis not present

## 2021-09-25 DIAGNOSIS — Z1509 Genetic susceptibility to other malignant neoplasm: Secondary | ICD-10-CM

## 2021-09-25 DIAGNOSIS — Z800 Family history of malignant neoplasm of digestive organs: Secondary | ICD-10-CM

## 2021-09-25 DIAGNOSIS — N83202 Unspecified ovarian cyst, left side: Secondary | ICD-10-CM

## 2021-09-25 DIAGNOSIS — Z780 Asymptomatic menopausal state: Secondary | ICD-10-CM | POA: Diagnosis not present

## 2021-09-25 DIAGNOSIS — Z1501 Genetic susceptibility to malignant neoplasm of breast: Secondary | ICD-10-CM

## 2021-09-25 DIAGNOSIS — Z1589 Genetic susceptibility to other disease: Secondary | ICD-10-CM

## 2021-09-25 DIAGNOSIS — C50411 Malignant neoplasm of upper-outer quadrant of right female breast: Secondary | ICD-10-CM | POA: Diagnosis not present

## 2021-09-25 DIAGNOSIS — Z170 Estrogen receptor positive status [ER+]: Secondary | ICD-10-CM

## 2021-09-25 NOTE — Patient Instructions (Addendum)
Replens vaginal moisturizer It's okay to use over the counter coconut oil Bonafide/Revaree vaginal suppoistories

## 2021-09-25 NOTE — Progress Notes (Signed)
71 y.o. G1P0 Widowed Black or [place] American female here for breast and pelvic exam.  I am also following her for history of .  Denies vaginal bleeding.  Patient's last menstrual period was 03/17/1983.          Sexually active: No.  H/O STD:  no  Health Maintenance: PCP:  Dr. Suzy Bouchard.  Last wellness appt was 4/20203.  Did blood work at that appt:  yes Vaccines are up to date:  has not done shingles vaccination Colonoscopy:  08/13/2021, follow up 5 years MMG:  07/29/2021 Negative BMD:  01/27/2018 Last pap smear: not indicated H/o abnormal pap smear:  remote hx    reports that she has never smoked. She has never used smokeless tobacco. She reports that she does not drink alcohol and does not use drugs.  Past Medical History:  Diagnosis Date   Breast cancer ([place]) 2008   left, with radiation finished 08, tamoxifen   Breast cancer ([place]) 2019   Rt. had lymph node removed  having radiation currently   Complication of anesthesia    woke up with colonoscopy and hysterectomy   Depression 01/24/2013   Family history of breast cancer    Family history of colon cancer    GERD (gastroesophageal reflux disease)    Hyperlipidemia    Hypertension    IBS (irritable bowel syndrome)    Palpitations    Personal history of radiation therapy 2007   left breast   Personal history of radiation therapy 2019   right breast   Vertigo     Past Surgical History:  Procedure Laterality Date   ABDOMINAL HYSTERECTOMY  1985   TAH (ovaries remain)   BREAST LUMPECTOMY Left 2007   BREAST LUMPECTOMY Right 2019   BREAST LUMPECTOMY WITH RADIOACTIVE SEED AND SENTINEL LYMPH NODE BIOPSY Right 07/22/2017   Procedure: BREAST LUMPECTOMY WITH RADIOACTIVE SEED AND SENTINEL LYMPH NODE BIOPSY;  Surgeon: Erroll Luna, MD;  Location: [facility];  Service: General;  Laterality: Right;   COLONOSCOPY     TONSILLECTOMY     age 74    Current Outpatient Medications  Medication Sig Dispense Refill    albuterol (VENTOLIN HFA) 108 (90 Base) MCG/ACT inhaler Inhale 2 puffs into the lungs every 6 (six) hours as needed for wheezing or shortness of breath. 8 g 6   Cholecalciferol (VITAMIN D3) 1000 units CAPS Take 1 capsule by mouth daily.     cloNIDine (CATAPRES) 0.1 MG tablet Take 0.1 mg by mouth at bedtime.     DULoxetine (CYMBALTA) 60 MG capsule Take 60 mg by mouth daily.      fluticasone (FLONASE) 50 MCG/ACT nasal spray Place 1 spray into both nostrils 2 (two) times daily.     ipratropium (ATROVENT) 0.03 % nasal spray Place 2 sprays into both nostrils 3 (three) times daily as needed for rhinitis. 30 mL 12   Multiple Vitamins-Minerals (ONE-A-DAY WOMENS 50 PLUS PO) Take 1 tablet by mouth daily.     ondansetron (ZOFRAN-ODT) 4 MG disintegrating tablet Use as directed for colonoscopy prep 4 tablet 0   RABEprazole (ACIPHEX) 20 MG tablet Take 1 tablet (20 mg total) by mouth in the morning and at bedtime. 60 tablet 1   rosuvastatin (CRESTOR) 20 MG tablet Take 20 mg by mouth daily.     triamterene-hydrochlorothiazide (DYAZIDE) 37.5-25 MG capsule Take by mouth.     WELLBUTRIN SR 150 MG 12 hr tablet Take 150 mg by mouth daily.     No current facility-administered  medications for this visit.    Family History  Problem Relation Age of Onset   Colon cancer Mother 99   Hypertension Mother    Kidney disease Mother    Diabetes Mother    Lung cancer Father        Died at 30 from lung cancer   Heart disease Father        heart attack   Allergic rhinitis Sister    Hypertension Sister    Diabetes Sister    Colon polyps Sister    Asthma Brother    Coronary artery disease Brother 87       died of "massive heart attack"   Diabetes Brother    Breast cancer Maternal Aunt        dx in her 51s   Ovarian cancer Paternal Grandmother    Colon cancer Cousin        maternal 1st cousin dx over 70   Breast cancer Cousin        mat 1st cousin dx in her 94s   Esophageal cancer Neg Hx    Stomach cancer Neg Hx     Rectal cancer Neg Hx    Pancreatic cancer Neg Hx    Liver disease Neg Hx     Review of Systems  Constitutional: Negative.   Genitourinary: Negative.     Exam:   BP (!) 139/54 (BP Location: Left Arm, Patient Position: Sitting, Cuff Size: Large)   Pulse 80   Ht '5\' 4"'  (1.626 m)   Wt 221 lb 9.6 oz (100.5 kg)   LMP 03/17/1983   BMI 38.04 kg/m   Height: '5\' 4"'  (162.6 cm)  General appearance: alert, cooperative and appears stated age Breasts: normal appearance, no masses or tenderness, well healed scars Abdomen: soft, non-tender; bowel sounds normal; no masses,  no organomegaly Lymph nodes: Cervical, supraclavicular, and axillary nodes normal.  No abnormal inguinal nodes palpated Neurologic: Grossly normal  Pelvic: External genitalia:  no lesions              Urethra:  normal appearing urethra with no masses, tenderness or lesions              Bartholins and Skenes: normal                 Vagina: normal appearing vagina with atrophic changes and no discharge, no lesions              Cervix: surgically absent              Pap taken: No. Bimanual Exam:  Uterus:  absent              Adnexa: normal adnexa and no mass, fullness, tenderness               Rectovaginal: Confirms               Anus:  normal sphincter tone, no lesions  Chaperone, Octaviano Batty, CMA, was present for exam.  Assessment/Plan: 1. GYN exam for high-risk Medicare patient - pap not indicated.  H/o hysterectomy 1985.   - Mammogram 07/2021 - Colonoscopy 07/2021, follow up 5 years - BMD 01/2018 - vaccines reviewed/updated - lab work done with Dr. Suzy Bouchard  2. Malignant neoplasm of upper-outer quadrant of right breast in female, estrogen receptor positive ([place]) - followed by oncology  3. Postmenopausal - no HRT - OTC products for vaginal dryness discussed including Replens, OTC coconut oil, bonafine or revaree  4. Left ovarian  cyst - will recheck this year - US PELVIC COMPLETE WITH TRANSVAGINAL; Future  5.  Monoallelic mutation of ATM gene - ovary removal was not recommended by genetic counselor after testing was completed  6. Family history of colon cancer

## 2021-10-07 NOTE — Progress Notes (Unsigned)
Synopsis: Referred for chronic cough by [name], DO  Subjective:   PATIENT ID: Katelyn Porter GENDER: female DOB: 1950-04-21, MRN: [id number]  No chief complaint on file.  71yF with history of left breast cancer s/p XRT 2008, GERD seen for chronic cough. Never did have covid-19.   Cough has been going on since October. Saw Dr. Henrene Pastor with GI and has had a couple of swallow evals. Has been on pantoprazole 40 BID taking 30 min before meals. Only occasional sensation of postnasal drainage. She has developed some hoarseness.   She saw ENT 04/24/21, normal FNL.  No DOE. Never had asthma as a kid.   Father had lung cancer  She is retired from working in Press [employer]. Has never lived outside of [place]. She has a cat that lives outside, 2 horses. She does clean the stalls. Never smoked. Husband used to smoke.   Interval HPI: Seen 2/15 at which point obtained unremarkable CT Chest. PFTs with obstruction based on FEV1/SVC, mild in severity, no BD response.   Cough is again much worse when eating but she doesn't think she has trouble with dysphagia or choking while eating.    Otherwise pertinent review of systems is negative.  Past Medical History:  Diagnosis Date   Breast cancer ([place]) 2008   left, with radiation finished 08, tamoxifen   Breast cancer ([place]) 2019   Rt. had lymph node removed  having radiation currently   Complication of anesthesia    woke up with colonoscopy and hysterectomy   Depression 01/24/2013   Family history of breast cancer    Family history of colon cancer    GERD (gastroesophageal reflux disease)    Hyperlipidemia    Hypertension    IBS (irritable bowel syndrome)    Palpitations    Personal history of radiation therapy 2007   left breast   Personal history of radiation therapy 2019   right breast   Vertigo      Family History  Problem Relation Age of Onset   Colon cancer Mother 60   Hypertension Mother    Kidney disease Mother    Diabetes  Mother    Lung cancer Father        Died at 40 from lung cancer   Heart disease Father        heart attack   Allergic rhinitis Sister    Hypertension Sister    Diabetes Sister    Colon polyps Sister    Asthma Brother    Coronary artery disease Brother 24       died of "massive heart attack"   Diabetes Brother    Breast cancer Maternal Aunt        dx in her 72s   Ovarian cancer Paternal Grandmother    Colon cancer Cousin        maternal 1st cousin dx over 16   Breast cancer Cousin        mat 1st cousin dx in her 42s   Esophageal cancer Neg Hx    Stomach cancer Neg Hx    Rectal cancer Neg Hx    Pancreatic cancer Neg Hx    Liver disease Neg Hx      Past Surgical History:  Procedure Laterality Date   ABDOMINAL HYSTERECTOMY  1985   TAH (ovaries remain)   BREAST LUMPECTOMY Left 2007   BREAST LUMPECTOMY Right 2019   BREAST LUMPECTOMY WITH RADIOACTIVE SEED AND SENTINEL LYMPH NODE BIOPSY Right 07/22/2017   Procedure:  BREAST LUMPECTOMY WITH RADIOACTIVE SEED AND SENTINEL LYMPH NODE BIOPSY;  Surgeon: Erroll Luna, MD;  Location: [facility];  Service: General;  Laterality: Right;   COLONOSCOPY     TONSILLECTOMY     age 35    Social History   Socioeconomic History   Marital status: Widowed    Spouse name: Not on file   Number of children: 0   Years of education: Not on file   Highest education level: Not on file  Occupational History   Occupation: accounts payable    Employer: [employer]  Tobacco Use   Smoking status: Never   Smokeless tobacco: Never  Vaping Use   Vaping Use: Never used  Substance and Sexual Activity   Alcohol use: No   Drug use: No   Sexual activity: Not Currently    Birth control/protection: Surgical    Comment: hysterectomy  Other Topics Concern   Not on file  Social History Narrative   No children   Married   Enjoys quilting and horseback riding.    She works in Pension scheme manager.   She completed 2 years of  college.    Social Determinants of Health   Financial Resource Strain: Not on file  Food Insecurity: Not on file  Transportation Needs: Not on file  Physical Activity: Not on file  Stress: Not on file  Social Connections: Not on file  Intimate Partner Violence: Not on file     Allergies  Allergen Reactions   Crab [Shellfish Allergy] Swelling    Blisters on tongue.   Sulfa Antibiotics Other (See Comments)    headache   Lipitor [Atorvastatin]     Muscle cramping   Sulfa Drugs Cross Reactors      Outpatient Medications Prior to Visit  Medication Sig Dispense Refill   albuterol (VENTOLIN HFA) 108 (90 Base) MCG/ACT inhaler Inhale 2 puffs into the lungs every 6 (six) hours as needed for wheezing or shortness of breath. 8 g 6   Cholecalciferol (VITAMIN D3) 1000 units CAPS Take 1 capsule by mouth daily.     cloNIDine (CATAPRES) 0.1 MG tablet Take 0.1 mg by mouth at bedtime.     DULoxetine (CYMBALTA) 60 MG capsule Take 60 mg by mouth daily.      fluticasone (FLONASE) 50 MCG/ACT nasal spray Place 1 spray into both nostrils 2 (two) times daily.     ipratropium (ATROVENT) 0.03 % nasal spray Place 2 sprays into both nostrils 3 (three) times daily as needed for rhinitis. 30 mL 12   Multiple Vitamins-Minerals (ONE-A-DAY WOMENS 50 PLUS PO) Take 1 tablet by mouth daily.     ondansetron (ZOFRAN-ODT) 4 MG disintegrating tablet Use as directed for colonoscopy prep 4 tablet 0   RABEprazole (ACIPHEX) 20 MG tablet Take 1 tablet (20 mg total) by mouth in the morning and at bedtime. 60 tablet 1   rosuvastatin (CRESTOR) 20 MG tablet Take 20 mg by mouth daily.     triamterene-hydrochlorothiazide (DYAZIDE) 37.5-25 MG capsule Take by mouth.     WELLBUTRIN SR 150 MG 12 hr tablet Take 150 mg by mouth daily.     No facility-administered medications prior to visit.       Objective:   Physical Exam:  General appearance: 71 y.o., female, NAD, conversant  Eyes: anicteric sclerae; PERRL, tracking  appropriately HENT: NCAT; MMM Neck: Trachea midline; no lymphadenopathy, no JVD Lungs: CTAB, no crackles, no wheeze, with normal respiratory effort CV: RRR, no murmur  Abdomen: Soft, non-tender;  non-distended, BS present  Extremities: No peripheral edema, warm Skin: Normal turgor and texture; no rash Psych: Appropriate affect Neuro: Alert and oriented to person and place, no focal deficit     There were no vitals filed for this visit.     on RA BMI Readings from Last 3 Encounters:  09/25/21 38.04 kg/m  08/13/21 40.60 kg/m  07/18/21 40.63 kg/m   Wt Readings from Last 3 Encounters:  09/25/21 221 lb 9.6 oz (100.5 kg)  08/13/21 222 lb (100.7 kg)  07/18/21 222 lb 2 oz (100.8 kg)     CBC    Component Value Date/Time   WBC 5.6 01/20/2021 0947   WBC 6.4 01/18/2020 0941   RBC 4.69 01/20/2021 0947   HGB 14.2 01/20/2021 0947   HGB 13.1 06/01/2011 1353   HCT 42.6 01/20/2021 0947   HCT 39.0 06/01/2011 1353   PLT 265 01/20/2021 0947   PLT 269 06/01/2011 1353   MCV 90.8 01/20/2021 0947   MCV 90.4 06/01/2011 1353   MCH 30.3 01/20/2021 0947   MCHC 33.3 01/20/2021 0947   RDW 13.2 01/20/2021 0947   RDW 12.3 06/01/2011 1353   LYMPHSABS 2.0 01/20/2021 0947   LYMPHSABS 2.1 06/01/2011 1353   MONOABS 0.5 01/20/2021 0947   MONOABS 0.5 06/01/2011 1353   EOSABS 0.1 01/20/2021 0947   EOSABS 0.1 06/01/2011 1353   BASOSABS 0.0 01/20/2021 0947   BASOSABS 0.1 06/01/2011 1353    Eos 100-200  Chest Imaging:  CT Chest 05/12/21 reviewed by me unremarkable  CXR today reviewed by me unchanged, final read pending  CXR 10/2018 reviewed by me unremarkable  CT A/P lung bases 2020 reviewed by me unremarkable  Barium swallow study on 02/25/21 demonstrated  mild dysmotility, intact primary wave but proximal escape of approximately 30% of the ingested bolus with stasis of a moderate amount in the proximal esophagus. Patient experienced cough during this time without clear cause.   No fixed  narrowing.  No gastroesophageal reflux during observation.   Modified barium swallow study performed on 03/14/21 demonstrated no significant abnormality identified. No evidence of laryngeal penetration or aspiration  FNL by ENT 04/24/21: Flexible laryngoscopy shows patent anterior nasal cavity with minimal crusting, no discharge or infection.  Normal base of tongue and supraglottis Normal vocal cord mobility without vocal cord nodule, mass, polyp or tumor. Hypopharynx normal without mass, pooling of secretions or aspiration. Unremarkable exam.   Pulmonary Functions Testing Results:    Latest Ref Rng & Units 05/23/2021    3:34 PM  PFT Results  FVC-Pre L 2.36   FVC-Predicted Pre % 98   FVC-Post L 2.29   FVC-Predicted Post % 95   Pre FEV1/FVC % % 80   Post FEV1/FCV % % 84   FEV1-Pre L 1.90   FEV1-Predicted Pre % 101   FEV1-Post L 1.93   DLCO uncorrected ml/min/mmHg 17.40   DLCO UNC% % 89   DLCO corrected ml/min/mmHg 17.40   DLCO COR %Predicted % 89   DLVA Predicted % 120   TLC L 4.26   TLC % Predicted % 84   RV % Predicted % 76    Mild obstruction by FEV1/SVC, no BD response     Assessment & Plan:   # Mild obstruction without BD reversibility Perhaps longstanding asthma vs mild COPD from second hand smoke exposure.   # Chronic cough:  Has made great effort to address any components of GERD, post nasal drainage. Unclear etiology - PFT raises question as to whether  untreated asthma/obstructive lung disease could be contributory. Her trouble with cough when singing or needing to use voice a lot, hoarseness could suggest a functional/vocal cord dysfunction issue.   # Snoring  Plan: - trial of ICS/LABA will send message to pharmacy to see what is most affordable - agree with ongoing evaluation by speech/voice clinic at [facility] and follow up with ENT - continue daily flonase, BID ppi - home sleep study   RTC 3 months   Maryjane Hurter, MD [facility] 10/07/2021 5:36 PM

## 2021-10-08 ENCOUNTER — Encounter (HOSPITAL_BASED_OUTPATIENT_CLINIC_OR_DEPARTMENT_OTHER): Payer: Self-pay | Admitting: Obstetrics & Gynecology

## 2021-10-09 ENCOUNTER — Encounter: Payer: Self-pay | Admitting: Student

## 2021-10-09 ENCOUNTER — Ambulatory Visit (INDEPENDENT_AMBULATORY_CARE_PROVIDER_SITE_OTHER): Payer: Medicare HMO | Admitting: Student

## 2021-10-09 VITALS — BP 134/72 | HR 78 | Temp 98.1°F | Ht 64.0 in | Wt 219.0 lb

## 2021-10-09 DIAGNOSIS — R053 Chronic cough: Secondary | ICD-10-CM | POA: Diagnosis not present

## 2021-10-09 NOTE — Patient Instructions (Addendum)
- STOP symbicort - albuterol as needed - rabeprazole 20 mg bid before meals - see instructions below to limit cough and throat clearing - continue flonase 1 spray each after clearing nose of crusting following a shower - gabapentin 300 mg nightly, 100 mg during the day   You are a chronic throat clearer! You are not alone! The causes of chronic throat clearing include acid reflux (laryngopharyngeal reflux), allergies, environmental irritants such as tobacco smoke and air pollution, and asthma. If present for a long time throat clearing can become habit forming. When you clear your throat, you are transferring mucus from your throat up into your mouth and nose. We all secrete up to 2 liters (imagine a big Coke bottle) of mucus a day. This saliva is usually swallowed and ends up in the toilet eventually. By clearing the mucus back into your mouth and nose you are sending the saliva in the wrong direction. This is counterproductive. Unless you are walking around spitting all day (which most throat clearers do not do), the mucus will work its way back down to the throat and eventually be swallowed. Get the mucus going in the right direction. Swallow! Swallow! Swallow! No throat clearing.  Chronic throat clearing is damaging. The trauma from the throat clearing can cause redness and swelling of your vocal cords. If the clearing is very excessive small growths (granulomas) can form. These granulomas can get so large that they can eventually affect your breathing. Surgical removal may be necessary. The irritation and swelling produced by the clearing can cause saliva to sit in your throat. This causes more throat clearing. More throat clearing causes more stagnant mucus which causes more throat clearing, which causes more mucus, etc. A vicious cycle will ensue and the habit can be very difficult to break. Without your help and a conscious effort on your part to break the cycle, the throat clearing will never  stop.  Your doctor may prescribe medication and behavioral modifications to treat acid reflux disease. Nose and throat sprays may be prescribed to treat underlying allergies or asthma. Avoiding possible irritants will be recommended. Without changes to your behavior these treatments will not be successful. The following alterations are recommended:  Do not clear your throat. Swallow instead. This gets the mucus going in the right direction towards the toilet.  Carry around some water to assist with swallowing and mucus clearance. When you feel the urge to clear your throat take a sip of the water. If you absolutely need to clear your throat perform a non-traumatic throat clear. To do this pant with your mouth open and say "[place], [place], [place]" with a powerful but very breathy voice. This will clear the secretions without causing damage. Increase your water intake. This will thin secretions and make it easier to swallow. Comply with the behavior recommendations for reflux disease. Chew baking soda ([employer]) gum. This can be found on the internet or in the tooth paste isle of your pharmacy. Gum chewing can help with swallowing, reflux, and throat clearing. Chew three pieces a day. If you develop jaw discomfort or headaches decrease the amount of gum chewing. Tell your friends and family to tell you to swallow when you clear your throat. Some people have been clearing so long that they don't even know when they are doing it. Be patient. The urge to clear your throat will not go away overnight. It may take 8 or 12 weeks for the medication and behavior modifications to work.

## 2021-10-10 DIAGNOSIS — R4020 Unspecified coma: Secondary | ICD-10-CM

## 2021-10-10 HISTORY — DX: Unspecified coma: R40.20

## 2021-11-13 ENCOUNTER — Ambulatory Visit (HOSPITAL_BASED_OUTPATIENT_CLINIC_OR_DEPARTMENT_OTHER)
Admission: RE | Admit: 2021-11-13 | Discharge: 2021-11-13 | Disposition: A | Discharge status: Home / Self Care | Payer: Medicare HMO | Source: Ambulatory Visit | Attending: Obstetrics & Gynecology | Admitting: Obstetrics & Gynecology

## 2021-11-13 DIAGNOSIS — N83202 Unspecified ovarian cyst, left side: Secondary | ICD-10-CM | POA: Diagnosis present

## 2021-11-19 ENCOUNTER — Other Ambulatory Visit (HOSPITAL_BASED_OUTPATIENT_CLINIC_OR_DEPARTMENT_OTHER): Payer: Medicare HMO

## 2021-11-19 ENCOUNTER — Ambulatory Visit (HOSPITAL_BASED_OUTPATIENT_CLINIC_OR_DEPARTMENT_OTHER): Payer: Medicare HMO | Admitting: Obstetrics & Gynecology

## 2021-11-20 DIAGNOSIS — R054 Cough syncope: Secondary | ICD-10-CM

## 2021-11-20 DIAGNOSIS — R55 Syncope and collapse: Secondary | ICD-10-CM | POA: Insufficient documentation

## 2021-11-20 HISTORY — DX: Cough syncope: R05.4

## 2021-11-21 NOTE — Progress Notes (Unsigned)
Synopsis: Referred for chronic cough by [name], DO  Subjective:   PATIENT ID: Katelyn Porter GENDER: female DOB: 04/06/1950, MRN: [id number]  No chief complaint on file.  71yF with history of left breast cancer s/p XRT 2008, GERD seen for chronic cough. Never did have covid-19.   Cough has been going on since October. Saw Dr. Henrene Pastor with GI and has had a couple of swallow evals. Has been on pantoprazole 40 BID taking 30 min before meals. Only occasional sensation of postnasal drainage. She has developed some hoarseness.   She saw ENT 04/24/21, normal FNL.  No DOE. Never had asthma as a kid.   Father had lung cancer  She is retired from working in Press photographer. Has never lived outside of [place]. She has a cat that lives outside, 2 horses. She does clean the stalls. Never smoked. Husband used to smoke.   Interval HPI: Has been followed closely by Dr. 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 Cough suppression therapy has been recommended but she hasn't completed it. Has discussed SLN block for neurogenic cough but when she heard there was even very low risk of stroke it spooked her.   The only thing that has been helpful for her so far is gabapentin. The 300 mg dose at night is effective.   Coughs quite a bit when she eats. Does have sinus congestion and postnasal drainage and is using flonase for this.  She says she continues to use symbicort inhaler but she doesn't think it's helpful for her cough.   She has changed her ppi which has helped her overt reflux symptoms.   Otherwise pertinent review of systems is negative.  Past Medical History:  Diagnosis Date   Breast cancer ([place]) 2008   left, with radiation finished 08, tamoxifen   Breast cancer ([place]) 2019   Rt. had lymph node removed  having radiation currently   Complication of anesthesia     woke up with colonoscopy and hysterectomy   Depression 01/24/2013   Family history of breast cancer    Family history of colon cancer    GERD (gastroesophageal reflux disease)    Hyperlipidemia    Hypertension    IBS (irritable bowel syndrome)    Palpitations    Personal history of radiation therapy 2007   left breast   Personal history of radiation therapy 2019   right breast   Vertigo      Family History  Problem Relation Age of Onset   Colon cancer Mother 22   Hypertension Mother    Kidney disease Mother    Diabetes Mother    Lung cancer Father        Died at 42 from lung cancer   Heart disease Father        heart attack   Allergic rhinitis Sister    Hypertension Sister    Diabetes Sister    Colon polyps Sister    Asthma Brother    Coronary artery disease Brother 19       died of "massive heart attack"   Diabetes Brother    Breast cancer Maternal Aunt        dx in her 45s   Ovarian cancer Paternal Grandmother    Colon cancer Cousin        maternal 1st cousin dx over 72   Breast cancer Cousin  mat 1st cousin dx in her 58s   Esophageal cancer Neg Hx    Stomach cancer Neg Hx    Rectal cancer Neg Hx    Pancreatic cancer Neg Hx    Liver disease Neg Hx      Past Surgical History:  Procedure Laterality Date   ABDOMINAL HYSTERECTOMY  1985   TAH (ovaries remain)   BREAST LUMPECTOMY Left 2007   BREAST LUMPECTOMY Right 2019   BREAST LUMPECTOMY WITH RADIOACTIVE SEED AND SENTINEL LYMPH NODE BIOPSY Right 07/22/2017   Procedure: BREAST LUMPECTOMY WITH RADIOACTIVE SEED AND SENTINEL LYMPH NODE BIOPSY;  Surgeon: Erroll Luna, MD;  Location: [facility];  Service: General;  Laterality: Right;   COLONOSCOPY     TONSILLECTOMY     age 36    Social History   Socioeconomic History   Marital status: Widowed    Spouse name: Not on file   Number of children: 0   Years of education: Not on file   Highest education level: Not on file  Occupational  History   Occupation: accounts payable    Employer: [employer]  Tobacco Use   Smoking status: Never   Smokeless tobacco: Never  Vaping Use   Vaping Use: Never used  Substance and Sexual Activity   Alcohol use: No   Drug use: No   Sexual activity: Not Currently    Birth control/protection: Surgical    Comment: hysterectomy  Other Topics Concern   Not on file  Social History Narrative   No children   Married   Enjoys quilting and horseback riding.    She works in Pension scheme manager.   She completed 2 years of college.    Social Determinants of Health   Financial Resource Strain: Not on file  Food Insecurity: Not on file  Transportation Needs: Not on file  Physical Activity: Not on file  Stress: Not on file  Social Connections: Not on file  Intimate Partner Violence: Not on file     Allergies  Allergen Reactions   Crab [Shellfish Allergy] Swelling    Blisters on tongue.   Sulfa Antibiotics Other (See Comments)    headache   Lipitor [Atorvastatin]     Muscle cramping   Sulfa Drugs Cross Reactors      Outpatient Medications Prior to Visit  Medication Sig Dispense Refill   albuterol (VENTOLIN HFA) 108 (90 Base) MCG/ACT inhaler Inhale 2 puffs into the lungs every 6 (six) hours as needed for wheezing or shortness of breath. 8 g 6   Cholecalciferol (VITAMIN D3) 1000 units CAPS Take 1 capsule by mouth daily.     cloNIDine (CATAPRES) 0.1 MG tablet Take 0.1 mg by mouth at bedtime.     DULoxetine (CYMBALTA) 60 MG capsule Take 60 mg by mouth daily.      fluticasone (FLONASE) 50 MCG/ACT nasal spray Place 1 spray into both nostrils 2 (two) times daily.     Multiple Vitamins-Minerals (ONE-A-DAY WOMENS 50 PLUS PO) Take 1 tablet by mouth daily.     RABEprazole (ACIPHEX) 20 MG tablet Take 1 tablet (20 mg total) by mouth in the morning and at bedtime. 60 tablet 1   rosuvastatin (CRESTOR) 20 MG tablet Take 20 mg by mouth daily.     triamterene-hydrochlorothiazide  (DYAZIDE) 37.5-25 MG capsule Take by mouth.     WELLBUTRIN SR 150 MG 12 hr tablet Take 150 mg by mouth daily.     No facility-administered medications prior to visit.  Objective:   Physical Exam:  General appearance: 71 y.o., female, NAD, conversant  Eyes: anicteric sclerae; PERRL, tracking appropriately HENT: NCAT; MMM Neck: Trachea midline; no lymphadenopathy, no JVD Lungs: CTAB, no crackles, no wheeze, with normal respiratory effort CV: RRR, no murmur  Abdomen: Soft, non-tender; non-distended, BS present  Extremities: No peripheral edema, warm Skin: Normal turgor and texture; no rash Psych: Appropriate affect Neuro: Alert and oriented to person and place, no focal deficit     There were no vitals filed for this visit.      on RA BMI Readings from Last 3 Encounters:  10/09/21 37.59 kg/m  09/25/21 38.04 kg/m  08/13/21 40.60 kg/m   Wt Readings from Last 3 Encounters:  10/09/21 219 lb (99.3 kg)  09/25/21 221 lb 9.6 oz (100.5 kg)  08/13/21 222 lb (100.7 kg)     CBC    Component Value Date/Time   WBC 5.6 01/20/2021 0947   WBC 6.4 01/18/2020 0941   RBC 4.69 01/20/2021 0947   HGB 14.2 01/20/2021 0947   HGB 13.1 06/01/2011 1353   HCT 42.6 01/20/2021 0947   HCT 39.0 06/01/2011 1353   PLT 265 01/20/2021 0947   PLT 269 06/01/2011 1353   MCV 90.8 01/20/2021 0947   MCV 90.4 06/01/2011 1353   MCH 30.3 01/20/2021 0947   MCHC 33.3 01/20/2021 0947   RDW 13.2 01/20/2021 0947   RDW 12.3 06/01/2011 1353   LYMPHSABS 2.0 01/20/2021 0947   LYMPHSABS 2.1 06/01/2011 1353   MONOABS 0.5 01/20/2021 0947   MONOABS 0.5 06/01/2011 1353   EOSABS 0.1 01/20/2021 0947   EOSABS 0.1 06/01/2011 1353   BASOSABS 0.0 01/20/2021 0947   BASOSABS 0.1 06/01/2011 1353    Eos 100-200  Chest Imaging:  CT Chest 05/12/21 reviewed by me unremarkable  CXR today reviewed by me unchanged, final read pending  CXR 10/2018 reviewed by me unremarkable  CT A/P lung bases 2020 reviewed  by me unremarkable  Barium swallow study on 02/25/21 demonstrated  mild dysmotility, intact primary wave but proximal escape of approximately 30% of the ingested bolus with stasis of a moderate amount in the proximal esophagus. Patient experienced cough during this time without clear cause.   No fixed narrowing.  No gastroesophageal reflux during observation.   Modified barium swallow study performed on 03/14/21 demonstrated no significant abnormality identified. No evidence of laryngeal penetration or aspiration  FNL by ENT 04/24/21: Flexible laryngoscopy shows patent anterior nasal cavity with minimal crusting, no discharge or infection.  Normal base of tongue and supraglottis Normal vocal cord mobility without vocal cord nodule, mass, polyp or tumor. Hypopharynx normal without mass, pooling of secretions or aspiration. Unremarkable exam.   Pulmonary Functions Testing Results:    Latest Ref Rng & Units 05/23/2021    3:34 PM  PFT Results  FVC-Pre L 2.36   FVC-Predicted Pre % 98   FVC-Post L 2.29   FVC-Predicted Post % 95   Pre FEV1/FVC % % 80   Post FEV1/FCV % % 84   FEV1-Pre L 1.90   FEV1-Predicted Pre % 101   FEV1-Post L 1.93   DLCO uncorrected ml/min/mmHg 17.40   DLCO UNC% % 89   DLCO corrected ml/min/mmHg 17.40   DLCO COR %Predicted % 89   DLVA Predicted % 120   TLC L 4.26   TLC % Predicted % 84   RV % Predicted % 76    Normal     Assessment & Plan:    # Chronic cough:  Has made great effort to address any components of GERD, post nasal drainage. Unclear etiology, didn't have good symptoamtic response to LABA/ICS and in fact just had issues with thrush. She certainly appears to have multifactorial upper airway cough, followed closely by ENT.     Plan: - STOP symbicort - albuterol as needed - rabeprazole 20 mg bid before meals - see instructions below to limit cough and throat clearing - continue flonase 1 spray each after clearing nose of crusting following a  shower - gabapentin 300 mg nightly, 100 mg during the day - TLC measures reviewed to limit reflux, throat clearing   RTC 3 months   Maryjane Hurter, MD [facility] 11/21/2021 8:06 PM

## 2021-11-24 ENCOUNTER — Ambulatory Visit (INDEPENDENT_AMBULATORY_CARE_PROVIDER_SITE_OTHER): Payer: Medicare HMO | Admitting: Student

## 2021-11-24 ENCOUNTER — Encounter: Payer: Self-pay | Admitting: Student

## 2021-11-24 VITALS — BP 128/66 | HR 69 | Temp 98.0°F | Ht 64.0 in | Wt 214.6 lb

## 2021-11-24 DIAGNOSIS — R053 Chronic cough: Secondary | ICD-10-CM | POA: Diagnosis not present

## 2021-11-24 NOTE — Patient Instructions (Addendum)
-   If you decide you're interested in more sensitive test for asthma we can order methacholine challenge - Most important probably to follow with ENT and GI for cough - suspicion for asthma is low based on PFTs and lack of response in past to symbicort and there isn't evidence of other lung issue based on CT Chest and PFT contributing to cough-

## 2021-11-28 ENCOUNTER — Other Ambulatory Visit: Payer: Self-pay | Admitting: Student

## 2021-11-29 ENCOUNTER — Other Ambulatory Visit: Payer: Self-pay | Admitting: Nurse Practitioner

## 2021-12-10 ENCOUNTER — Ambulatory Visit: Payer: Medicare HMO | Admitting: Student

## 2021-12-19 DIAGNOSIS — Z9189 Other specified personal risk factors, not elsewhere classified: Secondary | ICD-10-CM | POA: Insufficient documentation

## 2021-12-19 HISTORY — DX: Other specified personal risk factors, not elsewhere classified: Z91.89

## 2021-12-26 ENCOUNTER — Other Ambulatory Visit: Payer: Self-pay | Admitting: Student

## 2022-01-20 ENCOUNTER — Other Ambulatory Visit: Payer: Self-pay | Admitting: *Deleted

## 2022-01-20 ENCOUNTER — Other Ambulatory Visit: Payer: Self-pay | Admitting: Student

## 2022-01-20 DIAGNOSIS — Z170 Estrogen receptor positive status [ER+]: Secondary | ICD-10-CM

## 2022-01-21 ENCOUNTER — Inpatient Hospital Stay: Payer: Medicare HMO | Attending: Hematology and Oncology

## 2022-01-21 ENCOUNTER — Inpatient Hospital Stay (HOSPITAL_BASED_OUTPATIENT_CLINIC_OR_DEPARTMENT_OTHER): Payer: Medicare HMO | Admitting: Hematology and Oncology

## 2022-01-21 VITALS — BP 139/58 | HR 79 | Temp 97.8°F | Resp 16 | Ht 64.0 in | Wt 212.6 lb

## 2022-01-21 DIAGNOSIS — C50411 Malignant neoplasm of upper-outer quadrant of right female breast: Secondary | ICD-10-CM | POA: Insufficient documentation

## 2022-01-21 DIAGNOSIS — Z1502 Genetic susceptibility to malignant neoplasm of ovary: Secondary | ICD-10-CM | POA: Diagnosis not present

## 2022-01-21 DIAGNOSIS — Z1509 Genetic susceptibility to other malignant neoplasm: Secondary | ICD-10-CM | POA: Insufficient documentation

## 2022-01-21 DIAGNOSIS — Z170 Estrogen receptor positive status [ER+]: Secondary | ICD-10-CM | POA: Insufficient documentation

## 2022-01-21 DIAGNOSIS — Z1501 Genetic susceptibility to malignant neoplasm of breast: Secondary | ICD-10-CM | POA: Diagnosis not present

## 2022-01-21 DIAGNOSIS — Z79811 Long term (current) use of aromatase inhibitors: Secondary | ICD-10-CM | POA: Diagnosis not present

## 2022-01-21 LAB — CMP (CANCER CENTER ONLY)
ALT: 26 U/L (ref 0–44)
AST: 24 U/L (ref 15–41)
Albumin: 3.9 g/dL (ref 3.5–5.0)
Alkaline Phosphatase: 80 U/L (ref 38–126)
Anion gap: 6 (ref 5–15)
BUN: 20 mg/dL (ref 8–23)
CO2: 30 mmol/L (ref 22–32)
Calcium: 9.6 mg/dL (ref 8.9–10.3)
Chloride: 105 mmol/L (ref 98–111)
Creatinine: 1.07 mg/dL — ABNORMAL HIGH (ref 0.44–1.00)
GFR, Estimated: 56 mL/min — ABNORMAL LOW (ref >60)
Glucose, Bld: 115 mg/dL — ABNORMAL HIGH (ref 70–99)
Potassium: 3.9 mmol/L (ref 3.5–5.1)
Sodium: 141 mmol/L (ref 135–145)
Total Bilirubin: 0.6 mg/dL (ref 0.3–1.2)
Total Protein: 6.6 g/dL (ref 6.5–8.1)

## 2022-01-21 LAB — CBC WITH DIFFERENTIAL (CANCER CENTER ONLY)
Abs Immature Granulocytes: 0.01 10*3/uL (ref 0.00–0.07)
Basophils Absolute: 0 10*3/uL (ref 0.0–0.1)
Basophils Relative: 1 %
Eosinophils Absolute: 0.2 10*3/uL (ref 0.0–0.5)
Eosinophils Relative: 3 %
HCT: 39.9 % (ref 36.0–46.0)
Hemoglobin: 13.4 g/dL (ref 12.0–15.0)
Immature Granulocytes: 0 %
Lymphocytes Relative: 42 %
Lymphs Abs: 1.9 10*3/uL (ref 0.7–4.0)
MCH: 30.2 pg (ref 26.0–34.0)
MCHC: 33.6 g/dL (ref 30.0–36.0)
MCV: 90.1 fL (ref 80.0–100.0)
Monocytes Absolute: 0.5 10*3/uL (ref 0.1–1.0)
Monocytes Relative: 11 %
Neutro Abs: 2 10*3/uL (ref 1.7–7.7)
Neutrophils Relative %: 43 %
Platelet Count: 250 10*3/uL (ref 150–400)
RBC: 4.43 MIL/uL (ref 3.87–5.11)
RDW: 12.7 % (ref 11.5–15.5)
WBC Count: 4.7 10*3/uL (ref 4.0–10.5)
nRBC: 0 % (ref 0.0–0.2)

## 2022-01-21 MED ORDER — ANASTROZOLE 1 MG PO TABS: 1.0000 mg | ORAL_TABLET | Freq: Every day | ORAL | 3 refills | Status: DC

## 2022-01-21 NOTE — Progress Notes (Signed)
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  She tells me that she didn't know to continue anastrozole, stopped it almost a yr ago. She thought it was just for 3 yrs. Her main complaint today is intense coughing spells when she tries to chew food or if she put her finger in her nose. She tells me that she has seen a couple ENT docs but no improvement. She also some hoarse ness noted. She was treated for a yeast infection of her VC but she hasn't noted any improvement. She tells me that the coughing spell is so intense at times, that she passes out. Rest of the pertinent 10 point ROS reviewed and negative.    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  She was treated with surgery radiation and tamoxifen for 5 years and we released her from follow-up in 2013.  More recently she had routine screening mammography, on 06/16/2017 showing a possible abnormality in the right breast. She underwent unilateral right breast diagnostic mammography with tomography and  right breast ultrasonography at [facility] on 06/21/2017 showing breast density category B. Suspicious new mass in the right breast 9 o'clock central and 4 cm from the nipple measuring 0.8 x 0.6 x 0.7 cm. There was no axillary adenopathy sonographically.   Accordingly on 06/22/2017 she proceeded to biopsy of the right breast area in question. The pathology from this procedure showed ([id number]): Breast, right, needle core biopsy, 9:15 o'clock, UOQ with invasive ductal carcinoma with extracellular mucin, grade 2. Prognostic indicators significant for: estrogen receptor, 100% positive and progesterone receptor, 5% positive, both with strong staining intensity. Proliferation marker Ki67 at 20%. HER2 negative.  The patient's subsequent history is as detailed below.   PAST MEDICAL HISTORY: Past Medical History:  Diagnosis Date   Breast cancer ([place]) 2008   left, with radiation finished 08, tamoxifen   Breast cancer ([facility]) 2019   Rt. had lymph node removed  having radiation currently   Complication of anesthesia    woke up with colonoscopy and hysterectomy   Depression 01/24/2013   Family history of breast cancer    Family history of colon cancer    GERD (gastroesophageal reflux disease)    Hyperlipidemia    Hypertension    IBS (irritable bowel syndrome)    Palpitations    Personal history of radiation therapy 2007   left breast   Personal history of radiation therapy 2019   right breast   Vertigo     PAST SURGICAL HISTORY: Past Surgical History:  Procedure Laterality Date   ABDOMINAL HYSTERECTOMY  1985   TAH (ovaries remain)   BREAST LUMPECTOMY Left 2007   BREAST LUMPECTOMY Right  2019   BREAST LUMPECTOMY WITH RADIOACTIVE SEED AND SENTINEL LYMPH NODE BIOPSY Right 07/22/2017   Procedure: BREAST LUMPECTOMY WITH RADIOACTIVE SEED AND SENTINEL LYMPH NODE BIOPSY;  Surgeon: Erroll Luna, MD;  Location: [facility];  Service: General;  Laterality: Right;   COLONOSCOPY      TONSILLECTOMY     age 46    FAMILY HISTORY Family History  Problem Relation Age of Onset   Colon cancer Mother 55   Hypertension Mother    Kidney disease Mother    Diabetes Mother    Lung cancer Father        Died at 53 from lung cancer   Heart disease Father        heart attack   Allergic rhinitis Sister    Hypertension Sister    Diabetes Sister    Colon polyps Sister    Asthma Brother    Coronary artery disease Brother 13       died of "massive heart attack"   Diabetes Brother    Breast cancer Maternal Aunt        dx in her 21s   Ovarian cancer Paternal Grandmother    Colon cancer Cousin        maternal 1st cousin dx over 84   Breast cancer Cousin        mat 1st cousin dx in her 27s   Esophageal cancer Neg Hx    Stomach cancer Neg Hx    Rectal cancer Neg Hx    Pancreatic cancer Neg Hx    Liver disease Neg Hx   The patient's father had a history of tongue cancer which apparently spread to his lungs according to the patient. He passed due to a MI at age 17. The patient's mother died at age 52 due to renal failure and diabetes.  She also had a history of colon cancer. The patient had 1 brother who died from an MI; and 1 sister.  There was a maternal aunt diagnosed with breast cancer in the 6's. A maternal cousin passed due to breast cancer. Another maternal cousin had colon cancer. She denies a family history of ovarian cancer.    GYNECOLOGIC HISTORY:  Patient's last menstrual period was 03/17/1983. Menarche: 71 years old. She is GXP0. Her LMP was age 29 after her hysterectomy without BSO. She took HRT until 2008, discontinued at the time of her initial breast cancer diagnosis.    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 Her husband, Marjory Lies, worked in [employer] before retiring. He passed away January 17, 2018 from a hemorraghic stroke. He had 2 children from a prior marriage, a  daughter Zigmund Daniel, who lives in [place] and works for [employer], and a son, Personnel officer, who works for the [employer] in [place], [place]. The patient has 3 step-grandchildren, 1 of whom is now a Designer, jewellery.. She now lives alone. She attends [employer].     ADVANCED DIRECTIVES: Not in place.  At the 01/18/2020 visit the patient was given the appropriate documents to complete and notarized at her discretion.   HEALTH MAINTENANCE: Social History   Tobacco Use   Smoking status: Never   Smokeless tobacco: Never  Vaping Use   Vaping Use: Never used  Substance Use Topics   Alcohol use: No   Drug use: No     Colonoscopy: UTD/Dr. Perry/ normal  PAP: s/p TAHBSO  Bone density: 02/2019, +  1.9   Allergies  Allergen Reactions   Crab [Shellfish Allergy] Swelling    Blisters on tongue.   Sulfa Antibiotics Other (See Comments)    headache   Lipitor [Atorvastatin]     Muscle cramping   Sulfa Drugs Cross Reactors     Current Outpatient Medications  Medication Sig Dispense Refill   albuterol (VENTOLIN HFA) 108 (90 Base) MCG/ACT inhaler INHALE 2 PUFFS BY MOUTH EVERY 6 HOURS AS NEEDED FOR WHEEZING AND FOR SHORTNESS OF BREATH 18 g 0   Cholecalciferol (VITAMIN D3) 1000 units CAPS Take 1 capsule by mouth daily.     DULoxetine (CYMBALTA) 60 MG capsule Take 60 mg by mouth daily.      fluticasone (FLONASE) 50 MCG/ACT nasal spray Place 1 spray into both nostrils 2 (two) times daily.     metoprolol succinate (TOPROL-XL) 25 MG 24 hr tablet Take 25 mg by mouth daily.     Multiple Vitamins-Minerals (ONE-A-DAY WOMENS 50 PLUS PO) Take 1 tablet by mouth daily.     RABEprazole (ACIPHEX) 20 MG tablet TAKE 1 TABLET BY MOUTH IN THE MORNING AND 1 AT BEDTIME 60 tablet 0   WELLBUTRIN SR 150 MG 12 hr tablet Take 150 mg by mouth daily.     No current facility-administered medications for this visit.    OBJECTIVE: African-American woman who appears well  There were no vitals filed for this  visit.     There is no height or weight on file to calculate BMI.   Wt Readings from Last 3 Encounters:  11/24/21 214 lb 9.6 oz (97.3 kg)  10/09/21 219 lb (99.3 kg)  09/25/21 221 lb 9.6 oz (100.5 kg)     ECOG FS:1 - Symptomatic but completely ambulatory  Physical Exam Constitutional:      Appearance: Normal appearance.  Chest:     Comments: Bilateral breasts inspected. No palpable masses or regional adenopathy Musculoskeletal:     Cervical back: Normal range of motion. No rigidity.  Lymphadenopathy:     Cervical: No cervical adenopathy.  Neurological:     Mental Status: She is alert.       LAB RESULTS:  CMP     Component Value Date/Time   NA 139 01/20/2021 0947   K 3.6 01/20/2021 0947   CL 107 01/20/2021 0947   CO2 23 01/20/2021 0947   GLUCOSE 187 (H) 01/20/2021 0947   BUN 18 01/20/2021 0947   CREATININE 0.94 01/20/2021 0947   CREATININE 0.94 04/13/2013 1411   CALCIUM 9.4 01/20/2021 0947   PROT 6.8 01/20/2021 0947   ALBUMIN 3.9 01/20/2021 0947   AST 23 01/20/2021 0947   ALT 41 01/20/2021 0947   ALKPHOS 79 01/20/2021 0947   BILITOT 1.1 01/20/2021 0947   GFRNONAA >60 01/20/2021 0947   GFRNONAA 68 01/24/2013 0942   GFRAA >60 08/07/2019 0850   GFRAA >60 08/23/2018 1006   GFRAA 79 01/24/2013 0942    No results found for: "TOTALPROTELP", "ALBUMINELP", "A1GS", "A2GS", "BETS", "BETA2SER", "GAMS", "MSPIKE", "SPEI"  No results found for: "KPAFRELGTCHN", "LAMBDASER", "KAPLAMBRATIO"  Lab Results  Component Value Date   WBC 5.6 01/20/2021   NEUTROABS 3.0 01/20/2021   HGB 14.2 01/20/2021   HCT 42.6 01/20/2021   MCV 90.8 01/20/2021   PLT 265 01/20/2021    Lab Results  Component Value Date   LABCA2 30 07/28/2010    No components found for: "XNATFT732"  No results for input(s): "INR" in the last 168 hours.  Lab Results  Component Value Date  LABCA2 30 07/28/2010    No results found for: "UVO536"  Lab Results  Component Value Date   CAN125 10.1  01/26/2020    No results found for: "UYQ034"  No results found for: "CA2729"  No components found for: "HGQUANT"  No results found for: "CEA1", "CEA" / No results found for: "CEA1", "CEA"   No results found for: "AFPTUMOR"  No results found for: "CHROMOGRNA"  No results found for: "HGBA", "HGBA2QUANT", "HGBFQUANT", "HGBSQUAN" (Hemoglobinopathy evaluation)   No results found for: "LDH"  No results found for: "IRON", "TIBC", "IRONPCTSAT" (Iron and TIBC)  No results found for: "FERRITIN"  Urinalysis    Component Value Date/Time   COLORURINE YELLOW 10/22/2018 2130   APPEARANCEUR CLEAR 10/22/2018 2130   LABSPEC <1.005 (L) 10/22/2018 2130   PHURINE 6.5 10/22/2018 2130   GLUCOSEU NEGATIVE 10/22/2018 2130   HGBUR NEGATIVE 10/22/2018 2130   BILIRUBINUR NEGATIVE 10/22/2018 2130   BILIRUBINUR n 04/13/2013 1318   [place] NEGATIVE 10/22/2018 2130   PROTEINUR NEGATIVE 10/22/2018 2130   UROBILINOGEN negative 04/13/2013 1318   NITRITE NEGATIVE 10/22/2018 2130   LEUKOCYTESUR NEGATIVE 10/22/2018 2130    STUDIES: No results found.   ELIGIBLE FOR AVAILABLE RESEARCH PROTOCOL: no  ASSESSMENT: 71 y.o. [place] , [place] woman status post left lumpectomy February 2008 for a grade 3 ductal carcinoma in situ which was strongly estrogen and progesterone receptor positive.  (a)   adjuvant radiation completed May 2008  (b)  on tamoxifen July 2008 through March 2013.  (1) status post right breast upper outer quadrant biopsy 06/22/2017 for a clinnical T1b N0, stage IA invasive ductal carcinoma, with extracellular mucin; grade 2; estrogen and progesterone receptor positive, HER-2 not amplified, with an MIB-1 of 20%.  (2) Genetics testing on 07/08/2017 through [facility] Multi- Cancer Panel showed a Pathogenic variant  in ATM (c.7913G>A (p.Trp2638*)  (a)  VUS identified in DICER1 and POLE  (b) no additional deleterious mutations were noted in APC, ATM, AXIN2, BARD1, BMPR1A, BRCA1, BRCA2, BRIP1,  CDH1, CDK4, CDKN2A (p14ARF), CDKN2A (p16INK4a), CHEK2, CTNNA1, DICER1, EPCAM*, GREM1*, KIT, MEN1, MLH1, MSH2, MSH3, MSH6, MUTYH, NBN, NF1, PALB2, PDGFRA, PMS2, POLD1, POLE, PTEN, RAD50, RAD51C, RAD51D, SDHB, SDHC, SDHD, SMAD4, SMARCA4, STK11, TP53, TSC1, TSC2, VHL. The following genes were evaluated for sequence changes only: HOXB13*, NTHL1*, SDHA   (c) patients with ATM mutations are at increased risk of breast cancer potential increase in ovarian cancer and pancreatic cancer risk  (3) Oncotype obtained from the 06/22/2017 biopsy showed a score of 25, predicting a risk of recurrence outside the breast over the next 9 years of 12% if the patient took antiestrogens for 5 years.  It also shows no benefit from chemotherapy.  (4) status post right lumpectomy and sentinel lymph node sampling 07/22/2017 for a pT1b pN0, stage Ia invasive ductal carcinoma, grade 2, with extracellular mucin, and negative margins.  (a) a total of 3 lymph nodes were removed  (5) adjuvant radiation 08/31/2017 to 10/19/2017  Site/dose:    1. The Right breast was treated to 50.4 Gy in 28 fractions of 1.8 Gy. 2. The Right breast was boosted to 10 Gy in 5 fractions of 2 Gy.  (6) started anastrozole 11/14/2017  (a) bone density December 2020  (7) intensified screening:   (a) breast MRI December 2020, repeat Fall 2021 and yearly  (b) mammography 06/26/2019, repeat Spring 2022 and yearly  (8) ovarian cancer screening: per Dr Sabra Heck   PLAN:  She is doing really well so far from breast  cancer stand point. She needs to go back on anastrozole, I recommended that she restart ASAP, we will plan to continue till Sep 2025 since she hasn't been taking it for several months now Given ATM mutation, I agree that she should continue MRI alternating with mammograms. MRI has been ordered. 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  I dont think there is any abnormality anatomically if she has been seeing multiple ENT. She tells me that she was tried on gabapentin, no relief. She was wondering to see Dr Constance Holster, I sent him a message, they will try to get her in. She should RTC in one yr or sooner as needed.  Total time spent: 40 minutes including History, physical exam, review of records, counseling and coordination of care.  *Total Encounter Time as defined by the Centers for Medicare and Medicaid Services includes, in addition to the face-to-face time of a patient visit (documented in the note above) non-face-to-face time: obtaining and reviewing outside history, ordering and reviewing medications, tests or procedures, care coordination (communications with other health care professionals or caregivers) and documentation in the medical record.

## 2022-01-23 ENCOUNTER — Encounter: Payer: Self-pay | Admitting: Hematology and Oncology

## 2022-02-02 ENCOUNTER — Ambulatory Visit (HOSPITAL_COMMUNITY)
Admission: RE | Admit: 2022-02-02 | Discharge: 2022-02-02 | Disposition: A | Discharge status: Home / Self Care | Payer: Medicare HMO | Source: Ambulatory Visit | Attending: Hematology and Oncology | Admitting: Hematology and Oncology

## 2022-02-02 DIAGNOSIS — C50411 Malignant neoplasm of upper-outer quadrant of right female breast: Secondary | ICD-10-CM

## 2022-02-02 DIAGNOSIS — Z170 Estrogen receptor positive status [ER+]: Secondary | ICD-10-CM | POA: Insufficient documentation

## 2022-02-02 MED ORDER — GADOBUTROL 1 MMOL/ML IV SOLN
9.5000 mL | Freq: Once | INTRAVENOUS | Status: AC | PRN
  Administered 2022-02-02: 9.5 mL via INTRAVENOUS

## 2022-02-10 ENCOUNTER — Telehealth: Payer: Self-pay

## 2022-02-10 NOTE — Telephone Encounter (Signed)
-----   Message from Gardenia Phlegm, NP sent at 02/06/2022  3:30 PM EST ----- Please make sure patient knows her MRI breast was negative for cancer. ----- Message ----- From: Interface, Rad Results In Sent: 02/03/2022   2:05 PM EST To: Benay Pike, MD

## 2022-02-10 NOTE — Telephone Encounter (Signed)
Per NP's request, patient was made aware of her MRI test results. Patient verbalized understanding.

## 2022-02-12 DIAGNOSIS — L299 Pruritus, unspecified: Secondary | ICD-10-CM | POA: Insufficient documentation

## 2022-02-12 HISTORY — DX: Pruritus, unspecified: L29.9

## 2022-02-15 ENCOUNTER — Other Ambulatory Visit: Payer: Self-pay | Admitting: Student

## 2022-03-17 DIAGNOSIS — I5021 Acute systolic (congestive) heart failure: Secondary | ICD-10-CM | POA: Insufficient documentation

## 2022-03-17 DIAGNOSIS — I5020 Unspecified systolic (congestive) heart failure: Secondary | ICD-10-CM

## 2022-03-17 HISTORY — DX: Acute systolic (congestive) heart failure: I50.21

## 2022-03-17 HISTORY — DX: Unspecified systolic (congestive) heart failure: I50.20

## 2022-05-07 DIAGNOSIS — E119 Type 2 diabetes mellitus without complications: Secondary | ICD-10-CM

## 2022-05-07 HISTORY — DX: Type 2 diabetes mellitus without complications: E11.9

## 2022-05-25 DIAGNOSIS — B3789 Other sites of candidiasis: Secondary | ICD-10-CM | POA: Insufficient documentation

## 2022-05-25 HISTORY — DX: Other sites of candidiasis: B37.89

## 2022-06-22 DIAGNOSIS — Z6835 Body mass index (BMI) 35.0-35.9, adult: Secondary | ICD-10-CM

## 2022-06-22 HISTORY — DX: Morbid (severe) obesity due to excess calories: Z68.35

## 2022-06-29 NOTE — Telephone Encounter (Signed)
Verlon Au this is a Dr Marina Goodell pt.  Thank you

## 2022-07-07 ENCOUNTER — Ambulatory Visit (INDEPENDENT_AMBULATORY_CARE_PROVIDER_SITE_OTHER): Payer: Medicare HMO

## 2022-07-07 ENCOUNTER — Other Ambulatory Visit (HOSPITAL_COMMUNITY)
Admission: RE | Admit: 2022-07-07 | Discharge: 2022-07-07 | Disposition: A | Discharge status: Home / Self Care | Payer: Medicare HMO | Source: Ambulatory Visit | Attending: Obstetrics & Gynecology | Admitting: Obstetrics & Gynecology

## 2022-07-07 ENCOUNTER — Telehealth: Payer: Self-pay

## 2022-07-07 ENCOUNTER — Encounter (HOSPITAL_BASED_OUTPATIENT_CLINIC_OR_DEPARTMENT_OTHER): Payer: Self-pay

## 2022-07-07 VITALS — BP 144/59 | HR 66 | Wt 211.6 lb

## 2022-07-07 DIAGNOSIS — N898 Other specified noninflammatory disorders of vagina: Secondary | ICD-10-CM

## 2022-07-07 DIAGNOSIS — R300 Dysuria: Secondary | ICD-10-CM

## 2022-07-07 LAB — POCT URINALYSIS DIPSTICK
Bilirubin, UA: NEGATIVE
Blood, UA: NEGATIVE
Glucose, UA: NEGATIVE
Ketones, UA: NEGATIVE
Nitrite, UA: NEGATIVE
Protein, UA: NEGATIVE
Spec Grav, UA: 1.025 (ref 1.010–1.025)
Urobilinogen, UA: 0.2 E.U./dL
pH, UA: 5.5 (ref 5.0–8.0)

## 2022-07-07 MED ORDER — RABEPRAZOLE SODIUM 20 MG PO TBEC: DELAYED_RELEASE_TABLET | ORAL | 2 refills | Status: DC

## 2022-07-07 NOTE — Progress Notes (Signed)
Patient came in today with complaints of pain while urinating, urinary frequency and vaginal itching. Samples where evaluated and sent off to the lab for further evaluation. tbw

## 2022-07-07 NOTE — Telephone Encounter (Signed)
Aciphex refilled ?

## 2022-07-08 LAB — CERVICOVAGINAL ANCILLARY ONLY
Bacterial Vaginitis (gardnerella): NEGATIVE
Candida Glabrata: NEGATIVE
Candida Vaginitis: NEGATIVE
Comment: NEGATIVE
Comment: NEGATIVE
Comment: NEGATIVE

## 2022-07-09 LAB — URINE CULTURE

## 2022-07-23 ENCOUNTER — Ambulatory Visit: Payer: Medicare HMO | Admitting: Gastroenterology

## 2022-07-23 ENCOUNTER — Encounter: Payer: Self-pay | Admitting: Gastroenterology

## 2022-07-23 VITALS — BP 122/72 | HR 72 | Ht 64.0 in | Wt 210.0 lb

## 2022-07-23 DIAGNOSIS — K5900 Constipation, unspecified: Secondary | ICD-10-CM

## 2022-07-23 MED ORDER — LINACLOTIDE 72 MCG PO CAPS: 72.0000 ug | ORAL_CAPSULE | Freq: Every day | ORAL | 1 refills | Status: DC

## 2022-07-23 NOTE — Progress Notes (Signed)
07/23/2022 Katelyn Porter [id number] 03-25-1950   HISTORY OF PRESENT ILLNESS:  This is a 72 year old female who is a patient of Dr. Lamar Sprinkles.  She is here today with complaints of constipation.  She reports feeling like she never has complete bowel movements.  Has a lot of bloating, etc. and fullness in her upper abdomen after eating.  She is not diabetic.  She has tried Metamucil and MiraLAX without success.  Says that she has 1-2 BMs daily but they never feel complete, always just small amounts at a time.  She also has a chronic dry cough.  Unclear etiology.  GI ENT, and pulmonary evaluations have not yet identified etiology.  She saw speech/[facility] clinic at [facility].  They diagnosed her with irritable larynx syndrome.  She is also going to be seeing another ENT.  She reports having lost 15 pounds unintentionally, but looks like she is down about 10 to 12 pounds down over the course of a year's time.  EGD and colonoscopy are up-to-date, both within the past 1.5 years.  No abdominal pain.  Past Medical History:  Diagnosis Date   Breast cancer ([facility]) 2008   left, with radiation finished 08, tamoxifen   Breast cancer ([facility]) 2019   Rt. had lymph node removed  having radiation currently   Complication of anesthesia    woke up with colonoscopy and hysterectomy   Depression 01/24/2013   Family history of breast cancer    Family history of colon cancer    GERD (gastroesophageal reflux disease)    Hyperlipidemia    Hypertension    IBS (irritable bowel syndrome)    Palpitations    Personal history of radiation therapy 2007   left breast   Personal history of radiation therapy 2019   right breast   Vertigo    Past Surgical History:  Procedure Laterality Date   ABDOMINAL HYSTERECTOMY  1985   TAH (ovaries remain)   BREAST LUMPECTOMY Left 2007   BREAST LUMPECTOMY Right 2019   BREAST LUMPECTOMY WITH RADIOACTIVE SEED AND SENTINEL LYMPH NODE BIOPSY Right 07/22/2017   Procedure: BREAST  LUMPECTOMY WITH RADIOACTIVE SEED AND SENTINEL LYMPH NODE BIOPSY;  Surgeon: Harriette Bouillon, MD;  Location: [facility];  Service: General;  Laterality: Right;   COLONOSCOPY     TONSILLECTOMY     age 53    reports that she has never smoked. She has never used smokeless tobacco. She reports that she does not drink alcohol and does not use drugs. family history includes Allergic rhinitis in her sister; Asthma in her brother; Breast cancer in her cousin and maternal aunt; Colon cancer in her cousin; Colon cancer (age of onset: 13) in her mother; Colon polyps in her sister; Coronary artery disease (age of onset: 37) in her brother; Diabetes in her brother, mother, and sister; Heart disease in her father; Hypertension in her mother and sister; Kidney disease in her mother; Lung cancer in her father; Ovarian cancer in her paternal grandmother. Allergies  Allergen Reactions   Crab [Shellfish Allergy] Swelling    Blisters on tongue.   Sulfa Antibiotics Other (See Comments)    headache   Lipitor [Atorvastatin]     Muscle cramping   Sulfa Drugs Cross Reactors       Outpatient Encounter Medications as of 07/23/2022  Medication Sig   albuterol (VENTOLIN HFA) 108 (90 Base) MCG/ACT inhaler INHALE 2 PUFFS BY MOUTH EVERY 6 HOURS AS NEEDED FOR WHEEZING AND FOR SHORTNESS  OF BREATH   anastrozole (ARIMIDEX) 1 MG tablet Take 1 tablet (1 mg total) by mouth daily.   Cholecalciferol (VITAMIN D3) 1000 units CAPS Take 1 capsule by mouth daily.   DULoxetine (CYMBALTA) 60 MG capsule Take 60 mg by mouth daily.    fluticasone (FLONASE) 50 MCG/ACT nasal spray Place 1 spray into both nostrils 2 (two) times daily.   metoprolol succinate (TOPROL-XL) 25 MG 24 hr tablet Take 25 mg by mouth daily.   Multiple Vitamins-Minerals (ONE-A-DAY WOMENS 50 PLUS PO) Take 1 tablet by mouth daily.   pantoprazole (PROTONIX) 40 MG tablet Take by mouth.   RABEprazole (ACIPHEX) 20 MG tablet TAKE 1 TABLET BY MOUTH IN THE MORNING  AND 1 AT BEDTIME   WELLBUTRIN SR 150 MG 12 hr tablet Take 150 mg by mouth daily.   No facility-administered encounter medications on file as of 07/23/2022.     REVIEW OF SYSTEMS  : All other systems reviewed and negative except where noted in the History of Present Illness.   PHYSICAL EXAM: BP 122/72   Pulse 72   Ht 5\' 4"  (1.626 m)   Wt 210 lb (95.3 kg)   LMP 03/17/1983   BMI 36.05 kg/m  General: Well developed female in no acute distress Head: Normocephalic and atraumatic Eyes:  Sclerae anicteric, conjunctiva pink. Ears: Normal auditory acuity Heart: Regular rate and rhythm; no M/R/G. Lungs:  CTAB.  No W/R/R. Abdomen: Soft, non-distended.  BS present.  Non-tender. Musculoskeletal: Symmetrical with no gross deformities  Skin: No lesions on visible extremities Extremities: No edema  Neurological: Alert oriented x 4, grossly non-focal Psychological:  Alert and cooperative. Normal mood and affect  ASSESSMENT AND PLAN: *Constipation: Reports feeling like she never has complete bowel movements.  Has a lot of bloating, etc. and fullness in her upper abdomen after eating.  She is not diabetic.  She has tried Metamucil and MiraLAX without success.  Will treat underlying constipation and see how she does.  Will start with Linzess 72 mcg daily.  She was given samples and prescription sent to pharmacy.  She will update [place] in about a week and we will see if we will continue current dose or need to increase, etc. If no improvement in the some bloating despite more successfully treating constipation will consider CT scan of the abdomen and pelvis with contrast. *Cough, chronic dry cough.  Unclear etiology.  GI ENT, and pulmonary evaluations have not yet identified etiology.  She saw speech/[facility] clinic at [facility].  They diagnosed her with irritable larynx syndrome.  She is also going to be seeing another ENT.   CC:  Leola Brazil, DO

## 2022-07-23 NOTE — Patient Instructions (Addendum)
We have sent the following medications to your pharmacy for you to pick up at your convenience: Linzess 72 mcg daily before breakfast.    Send Mychart message after 7-10 days with an update.  _______________________________________________________  If your blood pressure at your visit was 140/90 or greater, please contact your primary care physician to follow up on this.  _______________________________________________________  If you are age 71 or older, your body mass index should be between 23-30. Your Body mass index is 36.05 kg/m. If this is out of the aforementioned range listed, please consider follow up with your Primary Care Provider.  If you are age 79 or younger, your body mass index should be between 19-25. Your Body mass index is 36.05 kg/m. If this is out of the aformentioned range listed, please consider follow up with your Primary Care Provider.   ________________________________________________________  The [facility] providers would like to encourage you to use [facility] to communicate with providers for non-urgent requests or questions.  Due to long hold times on the telephone, sending your provider a message by [facility] may be a faster and more efficient way to get a response.  Please allow 48 business hours for a response.  Please remember that this is for non-urgent requests.  _______________________________________________________

## 2022-07-27 NOTE — Progress Notes (Signed)
Noted  

## 2022-08-04 DIAGNOSIS — K2100 Gastro-esophageal reflux disease with esophagitis, without bleeding: Secondary | ICD-10-CM

## 2022-08-04 HISTORY — DX: Gastro-esophageal reflux disease with esophagitis, without bleeding: K21.00

## 2022-10-12 ENCOUNTER — Other Ambulatory Visit: Payer: Self-pay | Admitting: Internal Medicine

## 2022-10-12 DIAGNOSIS — Z1231 Encounter for screening mammogram for malignant neoplasm of breast: Secondary | ICD-10-CM

## 2022-10-14 ENCOUNTER — Ambulatory Visit
Admission: RE | Admit: 2022-10-14 | Discharge: 2022-10-14 | Disposition: A | Discharge status: Home / Self Care | Payer: Medicare HMO | Source: Ambulatory Visit | Attending: Internal Medicine | Admitting: Internal Medicine

## 2022-10-14 DIAGNOSIS — Z1231 Encounter for screening mammogram for malignant neoplasm of breast: Secondary | ICD-10-CM

## 2022-10-16 ENCOUNTER — Other Ambulatory Visit: Payer: Self-pay | Admitting: Internal Medicine

## 2022-10-16 DIAGNOSIS — R928 Other abnormal and inconclusive findings on diagnostic imaging of breast: Secondary | ICD-10-CM

## 2022-11-02 ENCOUNTER — Other Ambulatory Visit: Payer: Self-pay | Admitting: Internal Medicine

## 2022-11-02 ENCOUNTER — Ambulatory Visit
Admission: RE | Admit: 2022-11-02 | Discharge: 2022-11-02 | Disposition: A | Discharge status: Home / Self Care | Payer: Medicare HMO | Source: Ambulatory Visit | Attending: Internal Medicine | Admitting: Internal Medicine

## 2022-11-02 ENCOUNTER — Ambulatory Visit: Payer: Medicare HMO

## 2022-11-02 DIAGNOSIS — R928 Other abnormal and inconclusive findings on diagnostic imaging of breast: Secondary | ICD-10-CM

## 2022-11-02 DIAGNOSIS — N6310 Unspecified lump in the right breast, unspecified quadrant: Secondary | ICD-10-CM

## 2022-11-27 ENCOUNTER — Telehealth: Payer: Self-pay | Admitting: Gastroenterology

## 2022-11-27 NOTE — Telephone Encounter (Signed)
I spoke with the pt and she tells me that she has episodes of diarrhea and formed stools alternating.  She was last seen in May with Shanda Bumps. She was prescribed Linzess 72 mcg but never picked it up due to cost.  She would like an appt with Dr Marina Goodell to discuss.  I have given her the first available in November.  She is ok with that date since she prefers to see Dr Marina Goodell.  She will call back if she would like a sooner appt with app or call her PCP in the meantime.

## 2022-11-27 NOTE — Telephone Encounter (Signed)
Patient called states she is still having issues with pain right under her chest area every time she eats.

## 2022-12-15 ENCOUNTER — Telehealth: Payer: Self-pay | Admitting: Hematology and Oncology

## 2022-12-15 NOTE — Telephone Encounter (Signed)
Patient is aware of rescheduled appointment times/dates due to provider being out of office

## 2022-12-16 DIAGNOSIS — G8929 Other chronic pain: Secondary | ICD-10-CM | POA: Insufficient documentation

## 2022-12-16 HISTORY — DX: Other chronic pain: G89.29

## 2022-12-31 ENCOUNTER — Emergency Department (HOSPITAL_COMMUNITY): Payer: Medicare HMO

## 2022-12-31 ENCOUNTER — Emergency Department (HOSPITAL_COMMUNITY)
Admission: EM | Admit: 2022-12-31 | Discharge: 2022-12-31 | Disposition: A | Discharge status: Home / Self Care | Payer: Medicare HMO | Attending: Emergency Medicine | Admitting: Emergency Medicine

## 2022-12-31 ENCOUNTER — Encounter (HOSPITAL_COMMUNITY): Payer: Self-pay

## 2022-12-31 ENCOUNTER — Other Ambulatory Visit: Payer: Self-pay

## 2022-12-31 DIAGNOSIS — I10 Essential (primary) hypertension: Secondary | ICD-10-CM | POA: Insufficient documentation

## 2022-12-31 DIAGNOSIS — R0789 Other chest pain: Secondary | ICD-10-CM | POA: Diagnosis present

## 2022-12-31 DIAGNOSIS — Z853 Personal history of malignant neoplasm of breast: Secondary | ICD-10-CM | POA: Insufficient documentation

## 2022-12-31 DIAGNOSIS — Z79899 Other long term (current) drug therapy: Secondary | ICD-10-CM | POA: Insufficient documentation

## 2022-12-31 LAB — CBC
HCT: 42 % (ref 36.0–46.0)
Hemoglobin: 13.8 g/dL (ref 12.0–15.0)
MCH: 30.5 pg (ref 26.0–34.0)
MCHC: 32.9 g/dL (ref 30.0–36.0)
MCV: 92.7 fL (ref 80.0–100.0)
Platelets: 273 10*3/uL (ref 150–400)
RBC: 4.53 MIL/uL (ref 3.87–5.11)
RDW: 12.6 % (ref 11.5–15.5)
WBC: 6.6 10*3/uL (ref 4.0–10.5)
nRBC: 0 % (ref 0.0–0.2)

## 2022-12-31 LAB — BASIC METABOLIC PANEL
Anion gap: 11 (ref 5–15)
BUN: 18 mg/dL (ref 8–23)
CO2: 26 mmol/L (ref 22–32)
Calcium: 9.3 mg/dL (ref 8.9–10.3)
Chloride: 103 mmol/L (ref 98–111)
Creatinine, Ser: 0.9 mg/dL (ref 0.44–1.00)
GFR, Estimated: >60 mL/min (ref >60)
Glucose, Bld: 109 mg/dL — ABNORMAL HIGH (ref 70–99)
Potassium: 3.6 mmol/L (ref 3.5–5.1)
Sodium: 140 mmol/L (ref 135–145)

## 2022-12-31 LAB — TROPONIN I (HIGH SENSITIVITY)
Troponin I (High Sensitivity): 8 ng/L (ref <18)
Troponin I (High Sensitivity): 9 ng/L (ref <18)

## 2022-12-31 MED ORDER — MORPHINE SULFATE (PF) 2 MG/ML IV SOLN
2.0000 mg | Freq: Once | INTRAVENOUS | Status: AC
  Administered 2022-12-31: 2 mg via INTRAVENOUS
  Filled 2022-12-31: qty 1

## 2022-12-31 MED ORDER — IOHEXOL 350 MG/ML SOLN
75.0000 mL | Freq: Once | INTRAVENOUS | Status: AC | PRN
  Administered 2022-12-31: 75 mL via INTRAVENOUS

## 2022-12-31 MED ORDER — KETOROLAC TROMETHAMINE 15 MG/ML IJ SOLN
7.5000 mg | Freq: Once | INTRAMUSCULAR | Status: AC
  Administered 2022-12-31: 7.5 mg via INTRAVENOUS
  Filled 2022-12-31: qty 1

## 2022-12-31 MED ORDER — ONDANSETRON HCL 4 MG/2ML IJ SOLN
4.0000 mg | Freq: Once | INTRAMUSCULAR | Status: AC
  Administered 2022-12-31: 4 mg via INTRAVENOUS
  Filled 2022-12-31: qty 2

## 2022-12-31 MED ORDER — LIDOCAINE 5 % EX PTCH
1.0000 | MEDICATED_PATCH | CUTANEOUS | Status: DC
  Administered 2022-12-31: 1 via TRANSDERMAL
  Filled 2022-12-31: qty 1

## 2022-12-31 NOTE — Discharge Instructions (Signed)
Recheck with your primary care provider.  Return to the ER as needed for any worsening or concerning symptoms.  Please follow-up with cardiology, they will call to schedule an appointment.

## 2022-12-31 NOTE — ED Triage Notes (Signed)
Patient reports left sided chest pain that radiates to shoulder, SOB, dizziness, and headache since this AM. Patient denies cardiac hx.

## 2022-12-31 NOTE — ED Provider Notes (Signed)
[place] EMERGENCY DEPARTMENT AT [facility] Provider Note   CSN: [id number] Arrival date & time: 12/31/22  1236     History  Chief Complaint  Patient presents with   Chest Pain    Katelyn Porter is a 72 y.o. female.  72 year old female with complaint of CP under left breast to left midaxillary area onset 8am upon waking, feels like the pain woke her from her sleep, worse with exertion/movement, has tried holding her arm to her chest. Around 10am noticed numbness in her left 2-5 fingers. Associated with mild headache and SHOB, mild lower ext edema. Took Tylenol without improvement. No history of similar symptoms previously.   Hx of HTN, HLD, GERD, breast cancer. Non smoker       Home Medications Prior to Admission medications   Medication Sig Start Date End Date Taking? Authorizing Provider  albuterol (VENTOLIN HFA) 108 (90 Base) MCG/ACT inhaler INHALE 2 PUFFS BY MOUTH EVERY 6 HOURS AS NEEDED FOR WHEEZING AND FOR SHORTNESS OF BREATH 02/16/22   Omar Person, MD  anastrozole (ARIMIDEX) 1 MG tablet Take 1 tablet (1 mg total) by mouth daily. 01/21/22   Rachel Moulds, MD  Cholecalciferol (VITAMIN D3) 1000 units CAPS Take 1 capsule by mouth daily.    [provider]  DULoxetine (CYMBALTA) 60 MG capsule Take 60 mg by mouth daily.  06/22/17   [provider]  fluticasone (FLONASE) 50 MCG/ACT nasal spray Place 1 spray into both nostrils 2 (two) times daily. 04/25/21   [provider]  linaclotide Karlene Einstein) 72 MCG capsule Take 1 capsule (72 mcg total) by mouth daily before breakfast. 07/23/22   Zehr, Shanda Bumps D, PA-C  metoprolol succinate (TOPROL-XL) 25 MG 24 hr tablet Take 25 mg by mouth daily.    [provider]  Multiple Vitamins-Minerals (ONE-A-DAY WOMENS 50 PLUS PO) Take 1 tablet by mouth daily.    [provider]  pantoprazole (PROTONIX) 40 MG tablet Take by mouth. 02/24/22   [provider]  RABEprazole  (ACIPHEX) 20 MG tablet TAKE 1 TABLET BY MOUTH IN THE MORNING AND 1 AT BEDTIME 07/07/22   Hilarie Fredrickson, MD  [facility] SR 150 MG 12 hr tablet Take 150 mg by mouth daily. 06/29/19   [provider]      Allergies    Parke Simmers allergy], Sulfa antibiotics, Lipitor [atorvastatin], and Sulfa drugs cross reactors    Review of Systems   Review of Systems Negative except as per HPI Physical Exam Updated Vital Signs BP (!) 164/88   Pulse 73   Temp 98.2 F (36.8 C) (Oral)   Resp 12   Ht 5\' 4"  (1.626 m)   Wt 95.3 kg   LMP 03/17/1983   SpO2 96%   BMI 36.05 kg/m  Physical Exam Vitals and nursing note reviewed.  Constitutional:      General: She is not in acute distress.    Appearance: She is well-developed. She is not diaphoretic.  HENT:     Head: Normocephalic and atraumatic.  Cardiovascular:     Rate and Rhythm: Normal rate and regular rhythm.     Heart sounds: Normal heart sounds.  Pulmonary:     Effort: Pulmonary effort is normal.     Breath sounds: Normal breath sounds.  Chest:     Chest wall: Tenderness present.    Abdominal:     Palpations: Abdomen is soft.     Tenderness: There is no abdominal tenderness.  Musculoskeletal:  Cervical back: Neck supple.     Right lower leg: Edema present.     Left lower leg: Edema present.     Comments: Trace pitting edema bilaterally  Skin:    General: Skin is warm and dry.     Findings: No erythema or rash.  Neurological:     Mental Status: She is alert and oriented to person, place, and time.  Psychiatric:        Behavior: Behavior normal.     ED Results / Procedures / Treatments   Labs (all labs ordered are listed, but only abnormal results are displayed) Labs Reviewed  BASIC METABOLIC PANEL - Abnormal; Notable for the following components:      Result Value   Glucose, Bld 109 (*)    All other components within normal limits  CBC  TROPONIN I (HIGH SENSITIVITY)  TROPONIN I (HIGH SENSITIVITY)     EKG None  Radiology CT Angio Chest Aorta w/CM &/OR wo/CM  Result Date: 12/31/2022 CLINICAL DATA:  Left-sided chest pain that radiates to the left shoulder, shortness of breath, dizziness, headache since this morning. EXAM: CT CHEST WITHOUT AND WITH CONTRAST TECHNIQUE: Multidetector CT imaging of the chest was performed following the standard protocol before and during bolus administration of intravenous contrast. RADIATION DOSE REDUCTION: This exam was performed according to the departmental dose-optimization program which includes automated exposure control, adjustment of the mA and/or kV according to patient size and/or use of iterative reconstruction technique. CONTRAST:  75mL OMNIPAQUE IOHEXOL 350 MG/ML SOLN COMPARISON:  Same day chest radiograph and CT chest 05/09/2021; FINDINGS: Cardiovascular: No aortic aneurysm or dissection. Mild aortic atherosclerotic calcification. No pericardial effusion. No central pulmonary embolism. Mediastinum/Nodes: Trachea and esophagus are unremarkable. No thoracic adenopathy. Surgical clips right breast and axilla. Lungs/Pleura: Expiratory phase scan. Hypoventilation changes in the dependent lungs. No focal pneumonia, pleural effusion, or pneumothorax. Upper Abdomen: No acute abnormality. Musculoskeletal: No acute fracture. IMPRESSION: No acute abnormality in the chest. Aortic Atherosclerosis (ICD10-I70.0). Electronically Signed   By: Minerva Fester M.D.   On: 12/31/2022 19:21   DG Chest 2 View  Result Date: 12/31/2022 CLINICAL DATA:  Chest pain.  Shortness of breath. EXAM: CHEST - 2 VIEW COMPARISON:  04/30/2021. FINDINGS: Bilateral lung fields are clear. Note is again made of elevated right hemidiaphragm. Bilateral costophrenic angles are clear. Normal cardio-mediastinal silhouette. No acute osseous abnormalities. The soft tissues are within normal limits. IMPRESSION: 1. No active cardiopulmonary disease. Electronically Signed   By: Jules Schick M.D.   On:  12/31/2022 15:30    Procedures Procedures    Medications Ordered in ED Medications  lidocaine (LIDODERM) 5 % 1 patch (has no administration in time range)  ondansetron (ZOFRAN) injection 4 mg (4 mg Intravenous Given 12/31/22 1730)  morphine (PF) 2 MG/ML injection 2 mg (2 mg Intravenous Given 12/31/22 1732)  iohexol (OMNIPAQUE) 350 MG/ML injection 75 mL (75 mLs Intravenous Contrast Given 12/31/22 1823)  ketorolac (TORADOL) 15 MG/ML injection 7.5 mg (7.5 mg Intravenous Given 12/31/22 1954)    ED Course/ Medical Decision Making/ A&P Clinical Course as of 12/31/22 2123  Thu Dec 31, 2022  1402 Troponin I (High Sensitivity) [OB]  1434 Troponin I (High Sensitivity) [OB]    Clinical Course User Index [OB] Luanne Bras, Student-PA                                 Medical Decision Making Amount and/or Complexity of  Data Reviewed Labs: ordered. Radiology: ordered.  Risk Prescription drug management.   This patient presents to the ED for concern of chest pain, this involves an extensive number of treatment options, and is a complaint that carries with it a high risk of complications and morbidity.  The differential diagnosis includes ACS, PE, zoster, msk   Co morbidities that complicate the patient evaluation  HTN, HLD, depression, breast cancer, IBS   Additional history obtained:  External records from outside source obtained and reviewed including echo on file from 02/26/12 EF 75%   Lab Tests:  I Ordered, and personally interpreted labs.  The pertinent results include:  troponin 8 and 9. CBC WNL. BMP WNL.    Imaging Studies ordered:  I ordered imaging studies including CXR  I independently visualized and interpreted imaging which showed no acute abnormality of CXR. CT negative for dissection  I agree with the radiologist interpretation   Cardiac Monitoring: / EKG:  The patient was maintained on a cardiac monitor.  I personally viewed and interpreted the cardiac  monitored which showed an underlying rhythm of: sinus rhythm, rate 86   Consultations Obtained:  I requested consultation with the ER attending Dr. Andria Meuse,  and discussed lab and imaging findings as well as pertinent plan - they recommend: toradol for pain if dissection study negative. If 2nd trop unchanged, can share decision making with plan for amb referral to cards   Problem List / ED Course / Critical interventions / Medication management  72 year old female with left side chest pain as above. Pain is reproduced with palpation. Troponins negative and EKG without ischemic changes. CTA negative for dissection. Seen by ER attending who recommends toradol, amb referral to cardiology and return to ER as needed. I ordered medication including morphine, zofran  for pain  Reevaluation of the patient after these medicines showed that the patient improved, states pain went from 9/10 to 8.5/10, no visible distress (wong-baker 2).  Pain better improved after Toradol.  Will plan to discharge home with Lidoderm patch.  Provided with referral to cardiology as well as PCP recheck and return to ER precautions. I have reviewed the patients home medicines and have made adjustments as needed   Social Determinants of Health:  Has PCP   Test / Admission - Considered:  Agreeable with plan for discharge to follow-up with cardiology, return to ER as needed.         Final Clinical Impression(s) / ED Diagnoses Final diagnoses:  Atypical chest pain    Rx / DC Orders ED Discharge Orders          Ordered    Ambulatory referral to Cardiology        12/31/22 1929              Alden Hipp 12/31/22 2124    Anders Simmonds T, DO 01/01/23 [phone]

## 2023-01-04 NOTE — Plan of Care (Signed)
[facility] Tonsillectomy/Adenoidectomy, Postoperative PEDS care plan entered in error.

## 2023-01-06 ENCOUNTER — Other Ambulatory Visit: Payer: Self-pay

## 2023-01-06 ENCOUNTER — Emergency Department (HOSPITAL_COMMUNITY)
Admission: EM | Admit: 2023-01-06 | Discharge: 2023-01-06 | Disposition: A | Discharge status: Home / Self Care | Payer: Medicare HMO | Attending: Emergency Medicine | Admitting: Emergency Medicine

## 2023-01-06 ENCOUNTER — Emergency Department (HOSPITAL_COMMUNITY): Payer: Medicare HMO

## 2023-01-06 ENCOUNTER — Encounter (HOSPITAL_COMMUNITY): Payer: Self-pay

## 2023-01-06 DIAGNOSIS — R0602 Shortness of breath: Secondary | ICD-10-CM | POA: Diagnosis not present

## 2023-01-06 DIAGNOSIS — I10 Essential (primary) hypertension: Secondary | ICD-10-CM | POA: Diagnosis not present

## 2023-01-06 DIAGNOSIS — Z853 Personal history of malignant neoplasm of breast: Secondary | ICD-10-CM | POA: Diagnosis not present

## 2023-01-06 DIAGNOSIS — M25512 Pain in left shoulder: Secondary | ICD-10-CM | POA: Diagnosis present

## 2023-01-06 DIAGNOSIS — R0789 Other chest pain: Secondary | ICD-10-CM | POA: Diagnosis not present

## 2023-01-06 DIAGNOSIS — Z79899 Other long term (current) drug therapy: Secondary | ICD-10-CM | POA: Diagnosis not present

## 2023-01-06 LAB — CBC WITH DIFFERENTIAL/PLATELET
Abs Immature Granulocytes: 0.01 10*3/uL (ref 0.00–0.07)
Basophils Absolute: 0.1 10*3/uL (ref 0.0–0.1)
Basophils Relative: 1 %
Eosinophils Absolute: 0.2 10*3/uL (ref 0.0–0.5)
Eosinophils Relative: 4 %
HCT: 42.3 % (ref 36.0–46.0)
Hemoglobin: 13.8 g/dL (ref 12.0–15.0)
Immature Granulocytes: 0 %
Lymphocytes Relative: 42 %
Lymphs Abs: 2.1 10*3/uL (ref 0.7–4.0)
MCH: 30.1 pg (ref 26.0–34.0)
MCHC: 32.6 g/dL (ref 30.0–36.0)
MCV: 92.2 fL (ref 80.0–100.0)
Monocytes Absolute: 0.4 10*3/uL (ref 0.1–1.0)
Monocytes Relative: 9 %
Neutro Abs: 2.2 10*3/uL (ref 1.7–7.7)
Neutrophils Relative %: 44 %
Platelets: 260 10*3/uL (ref 150–400)
RBC: 4.59 MIL/uL (ref 3.87–5.11)
RDW: 12.5 % (ref 11.5–15.5)
WBC: 4.9 10*3/uL (ref 4.0–10.5)
nRBC: 0 % (ref 0.0–0.2)

## 2023-01-06 LAB — COMPREHENSIVE METABOLIC PANEL
ALT: 16 U/L (ref 0–44)
AST: 15 U/L (ref 15–41)
Albumin: 3.5 g/dL (ref 3.5–5.0)
Alkaline Phosphatase: 70 U/L (ref 38–126)
Anion gap: 6 (ref 5–15)
BUN: 18 mg/dL (ref 8–23)
CO2: 25 mmol/L (ref 22–32)
Calcium: 8.3 mg/dL — ABNORMAL LOW (ref 8.9–10.3)
Chloride: 109 mmol/L (ref 98–111)
Creatinine, Ser: 0.86 mg/dL (ref 0.44–1.00)
GFR, Estimated: >60 mL/min (ref >60)
Glucose, Bld: 106 mg/dL — ABNORMAL HIGH (ref 70–99)
Potassium: 3.7 mmol/L (ref 3.5–5.1)
Sodium: 140 mmol/L (ref 135–145)
Total Bilirubin: 1 mg/dL (ref 0.3–1.2)
Total Protein: 6.3 g/dL — ABNORMAL LOW (ref 6.5–8.1)

## 2023-01-06 LAB — TROPONIN I (HIGH SENSITIVITY): Troponin I (High Sensitivity): 8 ng/L (ref <18)

## 2023-01-06 MED ORDER — OXYCODONE-ACETAMINOPHEN 5-325 MG PO TABS
1.0000 | ORAL_TABLET | Freq: Once | ORAL | Status: AC
  Administered 2023-01-06: 1 via ORAL
  Filled 2023-01-06: qty 1

## 2023-01-06 MED ORDER — OXYCODONE HCL 5 MG PO TABS: 5.0000 mg | ORAL_TABLET | ORAL | 0 refills | Status: DC | PRN

## 2023-01-06 MED ORDER — METHOCARBAMOL 500 MG PO TABS: 500.0000 mg | ORAL_TABLET | Freq: Three times a day (TID) | ORAL | 0 refills | Status: DC | PRN

## 2023-01-06 NOTE — ED Provider Notes (Signed)
[place] EMERGENCY DEPARTMENT AT [facility] Provider Note  CSN: [id number] Arrival date & time: 01/06/23 1324  Chief Complaint(s) Left Shoulder Pain and Left Armpit Pain  HPI Katelyn Porter is a 72 y.o. female prior history of breast cancer, hypertension, lipidemia presenting to the emergency department with left axillary pain.  She reports she has had symptoms for around a week.  Reports occasional some shortness of breath.  Pain is worse with movement, breathing.  No recent travel or surgeries.  No leg swelling.  No fevers or chills.  She reports chronic cough which is unchanged.  She reports last night she had an episode of some abdominal pain, which lasted for a few moments and then resolved.  No loss of consciousness.  No lightheadedness or dizziness.   Past Medical History Past Medical History:  Diagnosis Date   Breast cancer (HCC) 2008   left, with radiation finished 08, tamoxifen   Breast cancer (HCC) 2019   Rt. 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/12/2021   Dysphonia 06/12/2021   Chronic cough 04/24/2021   NSVT (nonsustained ventricular tachycardia) (HCC) 03/11/2021   Breast cancer screening, high risk patient 01/18/2020   Tinnitus 08/07/2019   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   Hot flashes related to aromatase inhibitor therapy 04/21/2018   Family history of ovarian cancer 07/21/2017   Genetic  testing 07/13/2017   Family history of breast cancer    Family history of colon cancer    Malignant neoplasm of upper-outer quadrant of right breast in female, 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 Authorizing Provider  methocarbamol (ROBAXIN) 500 MG tablet Take 1 tablet (500 mg total) by mouth every 8 (eight) hours as needed for muscle spasms. 01/06/23  Yes Lonell Grandchild, MD  oxyCODONE (ROXICODONE) 5 MG immediate release tablet Take 1 tablet (5 mg total) by mouth every 4 (four) hours as needed for severe pain (pain score 7-10). 01/06/23  Yes Lonell Grandchild, MD  albuterol (VENTOLIN HFA) 108 (90 Base) MCG/ACT inhaler INHALE 2 PUFFS BY MOUTH EVERY 6 HOURS AS NEEDED FOR WHEEZING AND FOR SHORTNESS OF BREATH 02/16/22   Omar Person, MD  anastrozole (ARIMIDEX) 1 MG tablet Take 1 tablet (1 mg total) by mouth daily. 01/21/22   Rachel Moulds, MD  Cholecalciferol (VITAMIN D3) 1000 units CAPS Take 1 capsule by mouth daily.  [provider]  DULoxetine (CYMBALTA) 60 MG capsule Take 60 mg by mouth daily.  06/22/17   [provider]  fluticasone (FLONASE) 50 MCG/ACT nasal spray Place 1 spray into both nostrils 2 (two) times daily. 04/25/21   [provider]  linaclotide Karlene Einstein) 72 MCG capsule Take 1 capsule (72 mcg total) by mouth daily before breakfast. 07/23/22   Zehr, Shanda Bumps D, PA-C  metoprolol succinate (TOPROL-XL) 25 MG  24 hr tablet Take 25 mg by mouth daily.    [provider]  Multiple Vitamins-Minerals (ONE-A-DAY WOMENS 50 PLUS PO) Take 1 tablet by mouth daily.    [provider]  pantoprazole (PROTONIX) 40 MG tablet Take by mouth. 02/24/22   [provider]  RABEprazole (ACIPHEX) 20 MG tablet TAKE 1 TABLET BY MOUTH IN THE MORNING AND 1 AT BEDTIME 07/07/22   Hilarie Fredrickson, MD  [facility] SR 150 MG 12 hr tablet Take 150 mg by mouth daily. 06/29/19   [provider]                                                                                                                                    Past Surgical History Past Surgical History:  Procedure Laterality Date   ABDOMINAL HYSTERECTOMY  1985   TAH (ovaries remain)   BREAST LUMPECTOMY Left 2007   BREAST LUMPECTOMY Right 2019   BREAST LUMPECTOMY WITH RADIOACTIVE SEED AND SENTINEL LYMPH NODE BIOPSY Right 07/22/2017   Procedure: BREAST LUMPECTOMY WITH RADIOACTIVE SEED AND SENTINEL LYMPH NODE BIOPSY;  Surgeon: Harriette Bouillon, MD;  Location: [facility];  Service: General;  Laterality: Right;   COLONOSCOPY     TONSILLECTOMY     age 68   Family History Family History  Problem Relation Age of Onset   Colon cancer Mother 67   Hypertension Mother    Kidney disease Mother    Diabetes Mother    Lung cancer Father        Died at 97 from lung cancer   Heart disease Father        heart attack   Allergic rhinitis Sister    Hypertension Sister    Diabetes Sister    Colon polyps Sister    Asthma Brother    Coronary artery disease Brother 31       died of "massive heart attack"   Diabetes Brother    Breast cancer Maternal Aunt        dx in her 33s   Ovarian cancer Paternal Grandmother    Colon cancer Cousin        maternal 1st cousin dx over 108   Breast cancer Cousin        mat 1st cousin dx in her 36s   Esophageal cancer Neg Hx    Stomach cancer Neg Hx    Rectal cancer Neg Hx    Pancreatic cancer Neg  Hx  Liver disease Neg Hx     Social History Social History   Tobacco Use   Smoking status: Never   Smokeless tobacco: Never  Vaping Use   Vaping status: Never Used  Substance Use Topics   Alcohol use: No   Drug use: No   Allergies Crab [shellfish allergy], Sulfa antibiotics, Lipitor [atorvastatin], and Sulfa drugs cross reactors  Review of Systems Review of Systems  All other systems reviewed and are negative.   Physical Exam Vital Signs  I have reviewed the triage vital signs BP 136/74   Pulse 78   Temp 98.1 F (36.7 C) (Oral)   Resp (!) 21   LMP 03/17/1983   SpO2 98%  Physical Exam Vitals and nursing note reviewed.  Constitutional:      General: She is not in acute distress.    Appearance: She is well-developed.  HENT:     Head: Normocephalic and atraumatic.     Mouth/Throat:     Mouth: Mucous membranes are moist.  Eyes:     Pupils: Pupils are equal, round, and reactive to light.  Cardiovascular:     Rate and Rhythm: Normal rate and regular rhythm.     Heart sounds: No murmur heard. Pulmonary:     Effort: Pulmonary effort is normal. No respiratory distress.     Breath sounds: Normal breath sounds.  Abdominal:     General: Abdomen is flat.     Palpations: Abdomen is soft.     Tenderness: There is no abdominal tenderness.  Musculoskeletal:        General: No tenderness.     Right lower leg: No edema.     Left lower leg: No edema.     Comments: No chest wall tenderness  Skin:    General: Skin is warm and dry.     Findings: No rash.  Neurological:     General: No focal deficit present.     Mental Status: She is alert. Mental status is at baseline.  Psychiatric:        Mood and Affect: Mood normal.        Behavior: Behavior normal.     ED Results and Treatments Labs (all labs ordered are listed, but only abnormal results are displayed) Labs Reviewed  COMPREHENSIVE METABOLIC PANEL - Abnormal; Notable for the following components:      Result  Value   Glucose, Bld 106 (*)    Calcium 8.3 (*)    Total Protein 6.3 (*)    All other components within normal limits  CBC WITH DIFFERENTIAL/PLATELET  TROPONIN I (HIGH SENSITIVITY)                                                                                                                          Radiology DG Chest Port 1 View  Result Date: 01/06/2023 CLINICAL DATA:  Chest pain.  Shortness of breath EXAM: PORTABLE CHEST 1 VIEW COMPARISON:  X-ray and CT angiogram 12/31/2022 FINDINGS: No consolidation,  pneumothorax or effusion. No edema. Normal cardiopericardial silhouette. Overlapping cardiac leads. Surgical clips in the right axillary region. IMPRESSION: No acute cardiopulmonary disease. Electronically Signed   By: Karen Kays M.D.   On: 01/06/2023 10:40    Pertinent labs & imaging results that were available during my care of the patient were reviewed by me and considered in my medical decision making (see MDM for details).  Medications Ordered in ED Medications  oxyCODONE-acetaminophen (PERCOCET/ROXICET) 5-325 MG per tablet 1 tablet (1 tablet Oral Given 01/06/23 1010)                                                                                                                                     Procedures Procedures  (including critical care time)  Medical Decision Making / ED Course   MDM:  72 year old female presenting to the emergency department with chest pain.  Patient well-appearing, physical exam with no focal abnormality.  Pain seems musculoskeletal, worse with movement.  Pain is mildly pleuritic, but very low concern for PE, no hypoxia or tachycardia, risk factors such as recent travel or surgery, patient was also in the emergency department 1 week ago with similar symptoms and had a CT angio which did not demonstrate any pulmonary embolism or dissection.  Her symptoms are not significantly changed from previous visit.  Doubt pneumonia, pneumothorax but will  check chest x-ray.  Low concern for ACS but will check troponin.  No external findings like rash on exam.  Patient is concerned it could be her gallbladder, no right upper quadrant tenderness to suggest hepatobiliary process.  Will reassess, anticipate discharge.  Clinical Course as of 01/06/23 1208  Wed Jan 06, 2023  1200 Workup reassuring.  Chest x-ray clear.  Labs reassuring including normal troponin.  EKG without signs of acute ACS.  Given worsening with movement, symptoms seem more consistent with a muscle sprain. Patient is feeling better after medication. Will discharge patient to home. All questions answered. Patient comfortable with plan of discharge. Return precautions discussed with patient and specified on the after visit summary.  [WS]    Clinical Course User Index [WS] Lonell Grandchild, MD     Additional history obtained:  -External records from outside source obtained and reviewed including: Chart review including previous notes, labs, imaging, consultation notes including previous er visit for same    Lab Tests: -I ordered, reviewed, and interpreted labs.   The pertinent results include:   Labs Reviewed  COMPREHENSIVE METABOLIC PANEL - Abnormal; Notable for the following components:      Result Value   Glucose, Bld 106 (*)    Calcium 8.3 (*)    Total Protein 6.3 (*)    All other components within normal limits  CBC WITH DIFFERENTIAL/PLATELET  TROPONIN I (HIGH SENSITIVITY)    Notable for normal troponin  EKG   EKG Interpretation Date/Time:  Wednesday January 06 2023 08:28:31 EDT Ventricular Rate:  78 PR Interval:  138 QRS Duration:  88 QT Interval:  367 QTC Calculation: 418 R Axis:   30  Text Interpretation: Sinus rhythm Baseline wander in lead(s) V1 V3 V4 V5 V6 Confirmed by Alvino Blood ([id number]) on 01/06/2023 10:21:33 AM         Imaging Studies ordered: I ordered imaging studies including CXR On my interpretation imaging demonstrates no acute  process I independently visualized and interpreted imaging. I agree with the radiologist interpretation   Medicines ordered and prescription drug management: Meds ordered this encounter  Medications   oxyCODONE-acetaminophen (PERCOCET/ROXICET) 5-325 MG per tablet 1 tablet   methocarbamol (ROBAXIN) 500 MG tablet    Sig: Take 1 tablet (500 mg total) by mouth every 8 (eight) hours as needed for muscle spasms.    Dispense:  20 tablet    Refill:  0   oxyCODONE (ROXICODONE) 5 MG immediate release tablet    Sig: Take 1 tablet (5 mg total) by mouth every 4 (four) hours as needed for severe pain (pain score 7-10).    Dispense:  6 tablet    Refill:  0    -I have reviewed the patients home medicines and have made adjustments as needed   Cardiac Monitoring: The patient was maintained on a cardiac monitor.  I personally viewed and interpreted the cardiac monitored which showed an underlying rhythm of: NSR  Social Determinants of Health:  Diagnosis or treatment significantly limited by social determinants of health: obesity   Reevaluation: After the interventions noted above, I reevaluated the patient and found that their symptoms have improved  Co morbidities that complicate the patient evaluation  Past Medical History:  Diagnosis Date   Breast cancer (HCC) 2008   left, with radiation finished 08, tamoxifen   Breast cancer (HCC) 2019   Rt. had lymph node removed  having radiation currently   Complication of anesthesia    woke up with colonoscopy and hysterectomy   Depression 01/24/2013   Family history of breast cancer    Family history of colon cancer    GERD (gastroesophageal reflux disease)    Hyperlipidemia    Hypertension    IBS (irritable bowel syndrome)    Palpitations    Personal history of radiation therapy 2007   left breast   Personal history of radiation therapy 2019   right breast   Vertigo       Dispostion: Disposition decision including need for  hospitalization was considered, and patient discharged from emergency department.    Final Clinical Impression(s) / ED Diagnoses Final diagnoses:  Atypical chest pain     This chart was dictated using voice recognition software.  Despite best efforts to proofread,  errors can occur which can change the documentation meaning.    Lonell Grandchild, MD 01/06/23 [phone]

## 2023-01-06 NOTE — ED Triage Notes (Signed)
Pt here was here last week for same problem. Pt c/o 10/10 pain under left arm pt states can't feel where it hurts physically and when she takes a deep breath it hurts as well. Pain started a week ago.

## 2023-01-06 NOTE — Discharge Instructions (Addendum)
We evaluated you for your chest pain.  Your testing was reassuring including your cardiac testing and x-ray.  We reviewed your testing from your previous ER visit which was also reassuring.  I think the most likely cause of your symptoms is a muscle sprain or strain.  I prescribed you a muscle relaxer.  I have also prescribed you a few tablets of oxycodone for a very short period.  He is also take at 1000 mg of Tylenol every 6 hours as needed for pain.  Please be careful in taking these medications as it could make you drowsy.  Do not drive or operate machinery when taking oxycodone or muscle relaxer.  I would still recommend following up with cardiology although I think it is unlikely her symptoms are due to a heart problem.  Please also schedule follow-up with your primary doctor.  If you have any new or worsening symptoms, such as severe pain, difficulty breathing, lightheadedness or fainting, fevers, or any other new symptoms, please return for reassessment.

## 2023-01-13 DIAGNOSIS — G500 Trigeminal neuralgia: Secondary | ICD-10-CM | POA: Insufficient documentation

## 2023-01-13 HISTORY — DX: Trigeminal neuralgia: G50.0

## 2023-01-20 ENCOUNTER — Encounter: Payer: Self-pay | Admitting: Internal Medicine

## 2023-01-20 ENCOUNTER — Ambulatory Visit: Payer: Medicare HMO | Admitting: Internal Medicine

## 2023-01-20 VITALS — BP 140/68 | HR 80 | Ht 64.0 in | Wt 212.0 lb

## 2023-01-20 DIAGNOSIS — K219 Gastro-esophageal reflux disease without esophagitis: Secondary | ICD-10-CM | POA: Diagnosis not present

## 2023-01-20 DIAGNOSIS — R140 Abdominal distension (gaseous): Secondary | ICD-10-CM

## 2023-01-20 DIAGNOSIS — R059 Cough, unspecified: Secondary | ICD-10-CM

## 2023-01-20 NOTE — Patient Instructions (Signed)
You have been scheduled for a gastric emptying scan at [facility] on 02/08/2023 at 7:30am. Please arrive at least 15 minutes prior to your appointment for registration. Please make certain not to have anything to eat or drink after midnight the night before your test. Hold all stomach medications (ex: Zofran, phenergan, Reglan) 24 hours prior to your test. If you need to reschedule your appointment, please contact radiology scheduling at [phone]. _____________________________________________________________________ A gastric-emptying study measures how long it takes for food to move through your stomach. There are several ways to measure stomach emptying. In the most common test, you eat food that contains a small amount of radioactive material. A scanner that detects the movement of the radioactive material is placed over your abdomen to monitor the rate at which food leaves your stomach. This test normally takes about 4 hours to complete. ____________________________________________________________  _______________________________________________________  If your blood pressure at your visit was 140/90 or greater, please contact your primary care physician to follow up on this.  _______________________________________________________  If you are age 49 or older, your body mass index should be between 23-30. Your Body mass index is 36.39 kg/m. If this is out of the aforementioned range listed, please consider follow up with your Primary Care Provider.  If you are age 41 or younger, your body mass index should be between 19-25. Your Body mass index is 36.39 kg/m. If this is out of the aformentioned range listed, please consider follow up with your Primary Care Provider.   ________________________________________________________  The [facility] providers would like to encourage you to use [facility] to communicate with providers for non-urgent requests or questions.  Due to long hold  times on the telephone, sending your provider a message by [facility] may be a faster and more efficient way to get a response.  Please allow 48 business hours for a response.  Please remember that this is for non-urgent requests.  _______________________________________________________

## 2023-01-20 NOTE — Progress Notes (Signed)
HISTORY OF PRESENT ILLNESS:  Katelyn Porter is a 72 y.o. female with past medical history as listed below who is followed in this office for GERD and a history of multiple adenomatous colon polyps as well as a family history of colon cancer.  She was last seen in the office by the GI physician assistant on Jul 23, 2022 regarding chronic cough and constipation.  Today dictation for details.  She presents today with a chief complaint of ongoing cough and a sensation of early satiety.  Patient tells me that she has had chronic cough for about 3 years.  Associated with this is chronic throat clearing behavior.  She keeps cough drops and water with her.  Symptoms seem worse with meals but can occur most anytime.  She has seen multiple specialist including ENT, neurology, cardiology, and her dentist.  She is on twice daily PPI (pantoprazole 40 mg twice daily) without any classic reflux symptoms.  No dysphagia.  She tells me that her PCP suggested returning back to her gastroenterologist.  It should be noted that she underwent upper endoscopy December 26, 2020.  The examination was unremarkable.  Next, she reports abdominal fullness sensation after meals.  She states that it feels like the "food is just sitting there".  No vomiting.  No weight loss.  Bowel habits are now regulated.  Laboratories from January 06, 2023 included unremarkable comprehensive metabolic panel and CBC with hemoglobin 13.8.  REVIEW OF SYSTEMS:  All non-GI ROS negative unless otherwise stated in the HPI except for sinus allergy trouble, cough, fatigue, night sweats, headaches, voice change, shortness of breath  Past Medical History:  Diagnosis Date   Breast cancer (HCC) 2008   left, with radiation finished 08, tamoxifen   Breast cancer (HCC) 2019   Rt. had lymph node removed  having radiation currently   Complication of anesthesia    woke up with colonoscopy and hysterectomy   Depression 01/24/2013   Family history of breast  cancer    Family history of colon cancer    GERD (gastroesophageal reflux disease)    Hyperlipidemia    Hypertension    IBS (irritable bowel syndrome)    Palpitations    Personal history of radiation therapy 2007   left breast   Personal history of radiation therapy 2019   right breast   Vertigo     Past Surgical History:  Procedure Laterality Date   ABDOMINAL HYSTERECTOMY  1985   TAH (ovaries remain)   BREAST LUMPECTOMY Left 2007   BREAST LUMPECTOMY Right 2019   BREAST LUMPECTOMY WITH RADIOACTIVE SEED AND SENTINEL LYMPH NODE BIOPSY Right 07/22/2017   Procedure: BREAST LUMPECTOMY WITH RADIOACTIVE SEED AND SENTINEL LYMPH NODE BIOPSY;  Surgeon: Harriette Bouillon, MD;  Location: [facility];  Service: General;  Laterality: Right;   COLONOSCOPY     TONSILLECTOMY     age 87    Social History Katelyn Porter  reports that she has never smoked. She has never used smokeless tobacco. She reports that she does not drink alcohol and does not use drugs.  family history includes Allergic rhinitis in her sister; Asthma in her brother; Breast cancer in her cousin and maternal aunt; Colon cancer in her cousin; Colon cancer (age of onset: 25) in her mother; Colon polyps in her sister; Coronary artery disease (age of onset: 70) in her brother; Diabetes in her brother, mother, and sister; Heart disease in her father; Hypertension in her mother and sister; Kidney disease in  her mother; Lung cancer in her father; Ovarian cancer in her paternal grandmother.  Allergies  Allergen Reactions   Crab [Shellfish Allergy] Swelling    Blisters on tongue.   Sulfa Antibiotics Other (See Comments)    headache   Lipitor [Atorvastatin]     Muscle cramping   Sulfa Drugs Cross Reactors        PHYSICAL EXAMINATION: Vital signs: BP (!) 140/68   Pulse 80   Ht 5\' 4"  (1.626 m)   Wt 212 lb (96.2 kg)   LMP 03/17/1983   BMI 36.39 kg/m   Constitutional: generally well-appearing, no acute  distress Psychiatric: alert and oriented x3, cooperative Eyes: extraocular movements intact, anicteric, conjunctiva pink Mouth: oral pharynx moist, no lesions Neck: supple no lymphadenopathy Cardiovascular: heart regular rate and rhythm, no murmur Lungs: clear to auscultation bilaterally Abdomen: soft, nontender, nondistended, no obvious ascites, no peritoneal signs, normal bowel sounds, no organomegaly Rectal: Omitted Extremities: no clubbing, cyanosis, or lower extremity edema bilaterally Skin: no lesions on visible extremities Neuro: No focal deficits.  Cranial nerves intact  ASSESSMENT:  1.  Chronic cough.  Etiology unclear.  Primary GI cause identified or not suspected.  Twice daily PPI without impact on symptoms.  Essentially normal EGD.  Has seen multiple other specialists regarding the same. 2.  GERD.  Classic symptoms controlled with PPI 3.  Sensation of postprandial fullness or early satiety.  Rule out gastroparesis. 4.  History of multiple and advanced adenomatous colon polyps as well as a family history of colon cancer.  Last colonoscopy May 2023.  Follow-up in 5 years recommended   PLAN:  1.  Reflux precautions 2.  Continue PPI.  Lowest dose to control classic reflux symptoms is recommended. 3.  Patient should reach out to her PCP regarding the possibility of being seen at the "cough clinic" at [facility].  This would seem reasonable given all of her other extensive evaluations without an identifiable etiology and ongoing symptoms. 4.  Surveillance colonoscopy around May 2028. 5.  Interval follow-up as needed Total time of 30 minutes was spent preparing to see the patient, obtaining comprehensive history, performing medically appropriate physical examination, counseling and educating the patient regarding the above listed issues, and documenting clinical information in the health record.

## 2023-01-22 ENCOUNTER — Ambulatory Visit: Payer: Medicare HMO | Admitting: Hematology and Oncology

## 2023-01-22 ENCOUNTER — Other Ambulatory Visit: Payer: Medicare HMO

## 2023-01-25 ENCOUNTER — Other Ambulatory Visit: Payer: Self-pay

## 2023-01-25 ENCOUNTER — Ambulatory Visit: Payer: Medicare HMO

## 2023-01-25 DIAGNOSIS — C50411 Malignant neoplasm of upper-outer quadrant of right female breast: Secondary | ICD-10-CM

## 2023-01-26 ENCOUNTER — Inpatient Hospital Stay: Payer: Medicare HMO | Attending: Radiation Oncology

## 2023-01-26 ENCOUNTER — Inpatient Hospital Stay: Payer: Medicare HMO | Admitting: Hematology and Oncology

## 2023-01-26 VITALS — BP 159/59 | HR 85 | Temp 97.0°F | Resp 16 | Wt 214.9 lb

## 2023-01-26 DIAGNOSIS — Z79811 Long term (current) use of aromatase inhibitors: Secondary | ICD-10-CM | POA: Diagnosis not present

## 2023-01-26 DIAGNOSIS — C50411 Malignant neoplasm of upper-outer quadrant of right female breast: Secondary | ICD-10-CM | POA: Diagnosis not present

## 2023-01-26 DIAGNOSIS — R053 Chronic cough: Secondary | ICD-10-CM | POA: Diagnosis not present

## 2023-01-26 DIAGNOSIS — Z170 Estrogen receptor positive status [ER+]: Secondary | ICD-10-CM | POA: Diagnosis not present

## 2023-01-26 DIAGNOSIS — Z1721 Progesterone receptor positive status: Secondary | ICD-10-CM | POA: Insufficient documentation

## 2023-01-26 LAB — CBC WITH DIFFERENTIAL (CANCER CENTER ONLY)
Abs Immature Granulocytes: 0 10*3/uL (ref 0.00–0.07)
Basophils Absolute: 0 10*3/uL (ref 0.0–0.1)
Basophils Relative: 1 %
Eosinophils Absolute: 0.2 10*3/uL (ref 0.0–0.5)
Eosinophils Relative: 3 %
HCT: 41.8 % (ref 36.0–46.0)
Hemoglobin: 13.9 g/dL (ref 12.0–15.0)
Immature Granulocytes: 0 %
Lymphocytes Relative: 39 %
Lymphs Abs: 2 10*3/uL (ref 0.7–4.0)
MCH: 29.7 pg (ref 26.0–34.0)
MCHC: 33.3 g/dL (ref 30.0–36.0)
MCV: 89.3 fL (ref 80.0–100.0)
Monocytes Absolute: 0.5 10*3/uL (ref 0.1–1.0)
Monocytes Relative: 10 %
Neutro Abs: 2.5 10*3/uL (ref 1.7–7.7)
Neutrophils Relative %: 47 %
Platelet Count: 287 10*3/uL (ref 150–400)
RBC: 4.68 MIL/uL (ref 3.87–5.11)
RDW: 12.2 % (ref 11.5–15.5)
WBC Count: 5.2 10*3/uL (ref 4.0–10.5)
nRBC: 0 % (ref 0.0–0.2)

## 2023-01-26 LAB — CMP (CANCER CENTER ONLY)
ALT: 13 U/L (ref 0–44)
AST: 15 U/L (ref 15–41)
Albumin: 4.1 g/dL (ref 3.5–5.0)
Alkaline Phosphatase: 83 U/L (ref 38–126)
Anion gap: 6 (ref 5–15)
BUN: 14 mg/dL (ref 8–23)
CO2: 28 mmol/L (ref 22–32)
Calcium: 9.5 mg/dL (ref 8.9–10.3)
Chloride: 106 mmol/L (ref 98–111)
Creatinine: 0.92 mg/dL (ref 0.44–1.00)
GFR, Estimated: >60 mL/min (ref >60)
Glucose, Bld: 101 mg/dL — ABNORMAL HIGH (ref 70–99)
Potassium: 3.8 mmol/L (ref 3.5–5.1)
Sodium: 140 mmol/L (ref 135–145)
Total Bilirubin: 0.6 mg/dL (ref <1.2)
Total Protein: 6.7 g/dL (ref 6.5–8.1)

## 2023-01-26 MED ORDER — ANASTROZOLE 1 MG PO TABS: 1.0000 mg | ORAL_TABLET | Freq: Every day | ORAL | 3 refills | Status: DC

## 2023-01-26 NOTE — Progress Notes (Signed)
[facility]  [phone]) [phone] [phone]) [phone]     ID: Katelyn Porter DOB: 12/24/1950  MR#: [id number]  NFA#:[id number]  Patient Care Team: Leola Brazil, DO as PCP - General (Internal Medicine) Jerene Bears, MD as Attending Physician (Obstetrics and Gynecology) Harriette Bouillon, MD as Consulting Physician (General Surgery) Antony Blackbird, MD as Consulting Physician (Radiation Oncology) Hilarie Fredrickson, MD as Consulting Physician (Gastroenterology) Rachel Moulds, MD as Consulting Physician (Hematology and Oncology) OTHER MD:  CHIEF COMPLAINT: 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  History of Present Illness    Katelyn Porter returns today for follow-up of her estrogen receptor positive breast cancer.  The patient, with a history of breast cancer, presents with a persistent cough that has not improved. The cough is triggered by various activities, including eating and washing her ear, and is associated with a 'froggy' voice. She has seen multiple ENT specialists, including Dr. Pollyann Kennedy, who prescribed ear drops that did not alleviate the symptoms. The patient believes the issue is related to her ear, which has been sensitive and prone to blockage. She has also experienced a sensation of food 'staying' in her throat after eating. Despite these symptoms, she reports feeling 'pretty good' overall.  Rest of the pertinent 10 point ROS reviewed and negative.    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  She was treated with surgery radiation and tamoxifen for 5 years and we released her  from follow-up in 2013.  More recently she had routine screening mammography, on 06/16/2017 showing a possible abnormality in the right breast. She underwent unilateral right breast diagnostic mammography with tomography and right breast ultrasonography at [facility] on 06/21/2017 showing breast density category B. Suspicious new mass in the right breast 9 o'clock central and 4 cm from the nipple measuring 0.8 x 0.6 x 0.7 cm. There was no axillary adenopathy sonographically.   Accordingly on 06/22/2017 she proceeded to biopsy of the right breast area in question. The pathology from this procedure showed ([id number]): Breast, right, needle core biopsy, 9:15 o'clock, UOQ with invasive ductal carcinoma with extracellular mucin, grade 2. Prognostic indicators significant for: estrogen receptor, 100% positive and progesterone receptor, 5% positive, both with strong staining intensity. Proliferation marker Ki67 at 20%. HER2 negative.  The patient's subsequent history is as detailed below.   PAST MEDICAL HISTORY: Past Medical History:  Diagnosis Date   Breast cancer (HCC) 2008   left, with radiation finished 08, tamoxifen   Breast cancer (HCC) 2019   Rt. had lymph node removed  having radiation currently   Complication of anesthesia    woke up with colonoscopy and hysterectomy   Depression 01/24/2013   Family history of breast cancer    Family history of colon cancer    GERD (gastroesophageal reflux disease)    Hyperlipidemia    Hypertension    IBS (irritable bowel syndrome)    Palpitations    Personal history of radiation therapy 2007   left breast   Personal history of radiation therapy 2019   right breast   Vertigo     PAST SURGICAL HISTORY: Past Surgical History:  Procedure  Laterality Date   ABDOMINAL HYSTERECTOMY  1985   TAH (ovaries remain)   BREAST LUMPECTOMY Left 2007   BREAST LUMPECTOMY Right 2019   BREAST LUMPECTOMY WITH RADIOACTIVE SEED AND SENTINEL LYMPH NODE BIOPSY  Right 07/22/2017   Procedure: BREAST LUMPECTOMY WITH RADIOACTIVE SEED AND SENTINEL LYMPH NODE BIOPSY;  Surgeon: Harriette Bouillon, MD;  Location: [facility];  Service: General;  Laterality: Right;   COLONOSCOPY     TONSILLECTOMY     age 37    FAMILY HISTORY Family History  Problem Relation Age of Onset   Colon cancer Mother 76   Hypertension Mother    Kidney disease Mother    Diabetes Mother    Lung cancer Father        Died at 6 from lung cancer   Heart disease Father        heart attack   Allergic rhinitis Sister    Hypertension Sister    Diabetes Sister    Colon polyps Sister    Asthma Brother    Coronary artery disease Brother 32       died of "massive heart attack"   Diabetes Brother    Breast cancer Maternal Aunt        dx in her 78s   Ovarian cancer Paternal Grandmother    Colon cancer Cousin        maternal 1st cousin dx over 44   Breast cancer Cousin        mat 1st cousin dx in her 35s   Esophageal cancer Neg Hx    Stomach cancer Neg Hx    Rectal cancer Neg Hx    Pancreatic cancer Neg Hx    Liver disease Neg Hx   The patient's father had a history of tongue cancer which apparently spread to his lungs according to the patient. He passed due to a MI at age 10. The patient's mother died at age 79 due to renal failure and diabetes.  She also had a history of colon cancer. The patient had 1 brother who died from an MI; and 1 sister.  There was a maternal aunt diagnosed with breast cancer in the 34's. A maternal cousin passed due to breast cancer. Another maternal cousin had colon cancer. She denies a family history of ovarian cancer.    GYNECOLOGIC HISTORY:  Patient's last menstrual period was 03/17/1983. Menarche: 72 years old. She is GXP0. Her LMP was age 56 after her hysterectomy without BSO. She took HRT until 2008, discontinued at the time of her initial breast cancer diagnosis.    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 Her husband, [name], worked in [employer] before retiring. He passed away 2018/01/31 from a hemorraghic stroke. He had 2 children from a prior marriage, a daughter [name], who lives in [place] and works for [employer], and a son, Manufacturing engineer, who works for the [employer] in [place], [place]. The patient has 3 step-grandchildren, 1 of whom is now a Publishing rights manager.. She now lives alone. She attends [employer].     ADVANCED DIRECTIVES: Not in place.  At the 01/18/2020 visit the patient was given the appropriate documents to complete and notarized at her discretion.   HEALTH MAINTENANCE: Social History   Tobacco Use   Smoking status: Never   Smokeless tobacco: Never  Vaping Use   Vaping status: Never Used  Substance Use Topics  Alcohol use: No   Drug use: No     Colonoscopy: UTD/Dr. Perry/ normal  PAP: s/p TAHBSO  Bone density: 02/2019, +1.9   Allergies  Allergen Reactions   Crab [Shellfish Allergy] Swelling    Blisters on tongue.   Sulfa Antibiotics Other (See Comments)    headache   Lipitor [Atorvastatin]     Muscle cramping   Sulfa Drugs Cross Reactors     Current Outpatient Medications  Medication Sig Dispense Refill   anastrozole (ARIMIDEX) 1 MG tablet Take 1 tablet (1 mg total) by mouth daily. 90 tablet 3   Cholecalciferol (VITAMIN D3) 1000 units CAPS Take 1 capsule by mouth daily.     DULoxetine (CYMBALTA) 60 MG capsule Take 60 mg by mouth daily.      fluticasone (FLONASE) 50 MCG/ACT nasal spray Place 1 spray into both nostrils 2 (two) times daily.     methocarbamol (ROBAXIN) 500 MG tablet Take 1 tablet (500 mg total) by mouth every 8 (eight) hours as needed for muscle spasms. 20 tablet 0   metoprolol succinate (TOPROL-XL) 25 MG 24 hr tablet Take 25 mg by mouth daily.     Multiple Vitamins-Minerals (ONE-A-DAY WOMENS 50 PLUS PO) Take 1 tablet by mouth daily.     pantoprazole  (PROTONIX) 40 MG tablet Take by mouth.     WELLBUTRIN SR 150 MG 12 hr tablet Take 150 mg by mouth daily.     No current facility-administered medications for this visit.    OBJECTIVE: African-American woman who appears well  Vitals:   01/26/23 1148  BP: (!) 159/59  Pulse: 85  Resp: 16  Temp: (!) 97 F (36.1 C)  SpO2: 98%       Body mass index is 36.89 kg/m.   Wt Readings from Last 3 Encounters:  01/26/23 214 lb 14.4 oz (97.5 kg)  01/20/23 212 lb (96.2 kg)  12/31/22 210 lb (95.3 kg)     ECOG FS:1 - Symptomatic but completely ambulatory  Physical Exam Constitutional:      Appearance: Normal appearance.  Chest:     Comments: Bilateral breasts inspected. No palpable masses or regional adenopathy Musculoskeletal:     Cervical back: Normal range of motion. No rigidity.  Lymphadenopathy:     Cervical: No cervical adenopathy.  Neurological:     Mental Status: She is alert.       LAB RESULTS:  CMP     Component Value Date/Time   NA 140 01/06/2023 1015   K 3.7 01/06/2023 1015   CL 109 01/06/2023 1015   CO2 25 01/06/2023 1015   GLUCOSE 106 (H) 01/06/2023 1015   BUN 18 01/06/2023 1015   CREATININE 0.86 01/06/2023 1015   CREATININE 1.07 (H) 01/21/2022 0956   CREATININE 0.94 04/13/2013 1411   CALCIUM 8.3 (L) 01/06/2023 1015   PROT 6.3 (L) 01/06/2023 1015   ALBUMIN 3.5 01/06/2023 1015   AST 15 01/06/2023 1015   AST 24 01/21/2022 0956   ALT 16 01/06/2023 1015   ALT 26 01/21/2022 0956   ALKPHOS 70 01/06/2023 1015   BILITOT 1.0 01/06/2023 1015   BILITOT 0.6 01/21/2022 0956   GFRNONAA >60 01/06/2023 1015   GFRNONAA 56 (L) 01/21/2022 0956   GFRNONAA 68 01/24/2013 0942   GFRAA >60 08/07/2019 0850   GFRAA >60 08/23/2018 1006   GFRAA 79 01/24/2013 0942    No results found for: "TOTALPROTELP", "ALBUMINELP", "A1GS", "A2GS", "BETS", "BETA2SER", "GAMS", "MSPIKE", "SPEI"  No results found for: "KPAFRELGTCHN", "LAMBDASER", "KAPLAMBRATIO"  Lab  Results  Component  Value Date   WBC 5.2 01/26/2023   NEUTROABS 2.5 01/26/2023   HGB 13.9 01/26/2023   HCT 41.8 01/26/2023   MCV 89.3 01/26/2023   PLT 287 01/26/2023    Lab Results  Component Value Date   LABCA2 30 07/28/2010    No components found for: "WUJWJX914"  No results for input(s): "INR" in the last 168 hours.  Lab Results  Component Value Date   LABCA2 30 07/28/2010    No results found for: "NWG956"  Lab Results  Component Value Date   CAN125 10.1 01/26/2020    No results found for: "OZH086"  No results found for: "CA2729"  No components found for: "HGQUANT"  No results found for: "CEA1", "CEA" / No results found for: "CEA1", "CEA"   No results found for: "AFPTUMOR"  No results found for: "CHROMOGRNA"  No results found for: "HGBA", "HGBA2QUANT", "HGBFQUANT", "HGBSQUAN" (Hemoglobinopathy evaluation)   No results found for: "LDH"  No results found for: "IRON", "TIBC", "IRONPCTSAT" (Iron and TIBC)  No results found for: "FERRITIN"  Urinalysis    Component Value Date/Time   COLORURINE YELLOW 10/22/2018 2130   APPEARANCEUR CLEAR 10/22/2018 2130   LABSPEC <1.005 (L) 10/22/2018 2130   PHURINE 6.5 10/22/2018 2130   GLUCOSEU NEGATIVE 10/22/2018 2130   HGBUR NEGATIVE 10/22/2018 2130   BILIRUBINUR Negative 07/07/2022 1441   KETONESUR NEGATIVE 10/22/2018 2130   PROTEINUR Negative 07/07/2022 1441   PROTEINUR NEGATIVE 10/22/2018 2130   UROBILINOGEN 0.2 07/07/2022 1441   NITRITE Negative 07/07/2022 1441   NITRITE NEGATIVE 10/22/2018 2130   LEUKOCYTESUR Small (1+) (A) 07/07/2022 1441   LEUKOCYTESUR NEGATIVE 10/22/2018 2130    STUDIES: CT Angio Chest Aorta w/CM &/OR wo/CM  Addendum Date: 01/18/2023   ADDENDUM REPORT: 01/18/2023 18:53 ADDENDUM: Addendum to correct EXAM and TECHNIQUE. EXAM: CT ANGIOGRAPHY CHEST WITH CONTRAST TECHNIQUE: Multidetector CT imaging of the chest was performed using the standard protocol during bolus administration of intravenous contrast.  Multiplanar CT image reconstructions and MIPs were obtained to evaluate the vascular anatomy. RADIATION DOSE REDUCTION: This exam was performed according to the departmental dose-optimization program which includes automated exposure control, adjustment of the mA and/or kV according to patient size and/or use of iterative reconstruction technique. Electronically Signed   By: Minerva Fester M.D.   On: 01/18/2023 18:53   Result Date: 01/18/2023 CLINICAL DATA:  Left-sided chest pain that radiates to the left shoulder, shortness of breath, dizziness, headache since this morning. EXAM: CT CHEST WITHOUT AND WITH CONTRAST TECHNIQUE: Multidetector CT imaging of the chest was performed following the standard protocol before and during bolus administration of intravenous contrast. RADIATION DOSE REDUCTION: This exam was performed according to the departmental dose-optimization program which includes automated exposure control, adjustment of the mA and/or kV according to patient size and/or use of iterative reconstruction technique. CONTRAST:  75mL OMNIPAQUE IOHEXOL 350 MG/ML SOLN COMPARISON:  Same day chest radiograph and CT chest 05/09/2021; FINDINGS: Cardiovascular: No aortic aneurysm or dissection. Mild aortic atherosclerotic calcification. No pericardial effusion. No central pulmonary embolism. Mediastinum/Nodes: Trachea and esophagus are unremarkable. No thoracic adenopathy. Surgical clips right breast and axilla. Lungs/Pleura: Expiratory phase scan. Hypoventilation changes in the dependent lungs. No focal pneumonia, pleural effusion, or pneumothorax. Upper Abdomen: No acute abnormality. Musculoskeletal: No acute fracture. IMPRESSION: No acute abnormality in the chest. Aortic Atherosclerosis (ICD10-I70.0). Electronically Signed: By: Minerva Fester M.D. On: 12/31/2022 19:21   DG Chest Port 1 View  Result Date: 01/06/2023 CLINICAL DATA:  Chest pain.  Shortness of breath EXAM: PORTABLE CHEST 1 VIEW COMPARISON:   X-ray and CT angiogram 12/31/2022 FINDINGS: No consolidation, pneumothorax or effusion. No edema. Normal cardiopericardial silhouette. Overlapping cardiac leads. Surgical clips in the right axillary region. IMPRESSION: No acute cardiopulmonary disease. Electronically Signed   By: Karen Kays M.D.   On: 01/06/2023 10:40   DG Chest 2 View  Result Date: 12/31/2022 CLINICAL DATA:  Chest pain.  Shortness of breath. EXAM: CHEST - 2 VIEW COMPARISON:  04/30/2021. FINDINGS: Bilateral lung fields are clear. Note is again made of elevated right hemidiaphragm. Bilateral costophrenic angles are clear. Normal cardio-mediastinal silhouette. No acute osseous abnormalities. The soft tissues are within normal limits. IMPRESSION: 1. No active cardiopulmonary disease. Electronically Signed   By: Jules Schick M.D.   On: 12/31/2022 15:30     ELIGIBLE FOR AVAILABLE RESEARCH PROTOCOL: no  ASSESSMENT: 72 y.o. [place] , [place] woman status post left lumpectomy February 2008 for a grade 3 ductal carcinoma in situ which was strongly estrogen and progesterone receptor positive.  (a)   adjuvant radiation completed May 2008  (b)  on tamoxifen July 2008 through March 2013.  (1) status post right breast upper outer quadrant biopsy 06/22/2017 for a clinnical T1b N0, stage IA invasive ductal carcinoma, with extracellular mucin; grade 2; estrogen and progesterone receptor positive, HER-2 not amplified, with an MIB-1 of 20%.  (2) Genetics testing on 07/08/2017 through [facility] Multi- Cancer Panel showed a Pathogenic variant  in ATM (c.7913G>A (p.[id number]*)  (a)  VUS identified in DICER1 and POLE  (b) no additional deleterious mutations were noted in APC, ATM, AXIN2, BARD1, BMPR1A, BRCA1, BRCA2, BRIP1, CDH1, CDK4, CDKN2A (p14ARF), CDKN2A (p16INK4a), CHEK2, CTNNA1, DICER1, EPCAM*, GREM1*, KIT, MEN1, MLH1, MSH2, MSH3, MSH6, MUTYH, NBN, NF1, PALB2, PDGFRA, PMS2, POLD1, POLE, PTEN, RAD50, RAD51C, RAD51D, SDHB, SDHC, SDHD, SMAD4, SMARCA4,  STK11, TP53, TSC1, TSC2, VHL. The following genes were evaluated for sequence changes only: HOXB13*, NTHL1*, SDHA   (c) patients with ATM mutations are at increased risk of breast cancer potential increase in ovarian cancer and pancreatic cancer risk  (3) Oncotype obtained from the 06/22/2017 biopsy showed a score of 25, predicting a risk of recurrence outside the breast over the next 9 years of 12% if the patient took antiestrogens for 5 years.  It also shows no benefit from chemotherapy.  (4) status post right lumpectomy and sentinel lymph node sampling 07/22/2017 for a pT1b pN0, stage Ia invasive ductal carcinoma, grade 2, with extracellular mucin, and negative margins.  (a) a total of 3 lymph nodes were removed  (5) adjuvant radiation 08/31/2017 to 10/19/2017  Site/dose:    1. The Right breast was treated to 50.4 Gy in 28 fractions of 1.8 Gy. 2. The Right breast was boosted to 10 Gy in 5 fractions of 2 Gy.  (6) started anastrozole 11/14/2017  (a) bone density December 2020  (7) intensified screening:   (a) breast MRI December 2020, repeat Fall 2021 and yearly  (b) mammography 06/26/2019, repeat Spring 2022 and yearly  (8) ovarian cancer screening: per Dr Hyacinth Meeker   PLAN: Chronic Cough Persistent despite multiple ENT consultations and treatments. 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 Use of Flonase and antihistamines (Claritin/Zyrtec) provides some relief. -Continue Flonase and antihistamines as needed. -Consider further ENT consultation for comprehensive evaluation.  Breast Cancer On maintenance therapy, no new complaints. -Continue current medication, to complete five years of therapy by Fall 2025. -Order MRI of the breast due to ATM mutation, to be scheduled in the coming weeks.  Benign Breast Lymph Node Identified on previous imaging, believed to be benign. -Scheduled for ultrasound in February 2025 for further evaluation.  General Health Maintenance -Schedule gynecology appointment  in Spring 2025. -Follow-up in Fall 2025.  Total time spent: 30 minutes including History, physical exam, review of records, counseling and coordination of care.  *Total Encounter Time as defined by the Centers for Medicare and Medicaid Services includes, in addition to the face-to-face time of a patient visit (documented in the note above) non-face-to-face time: obtaining and reviewing outside history, ordering and reviewing medications, tests or procedures, care coordination (communications with other health care professionals or caregivers) and documentation in the medical record.

## 2023-02-08 ENCOUNTER — Encounter (HOSPITAL_COMMUNITY)
Admission: RE | Admit: 2023-02-08 | Discharge: 2023-02-08 | Disposition: A | Discharge status: Home / Self Care | Payer: Medicare HMO | Source: Ambulatory Visit | Attending: Internal Medicine | Admitting: Internal Medicine

## 2023-02-08 DIAGNOSIS — K219 Gastro-esophageal reflux disease without esophagitis: Secondary | ICD-10-CM | POA: Diagnosis present

## 2023-02-08 DIAGNOSIS — R059 Cough, unspecified: Secondary | ICD-10-CM

## 2023-02-08 DIAGNOSIS — R140 Abdominal distension (gaseous): Secondary | ICD-10-CM

## 2023-02-08 MED ORDER — TECHNETIUM TC 99M SULFUR COLLOID
2.0000 | Freq: Once | INTRAVENOUS | Status: AC
  Administered 2023-02-08: 2 via INTRAVENOUS

## 2023-02-25 ENCOUNTER — Other Ambulatory Visit: Payer: Self-pay

## 2023-02-25 DIAGNOSIS — T8859XA Other complications of anesthesia, initial encounter: Secondary | ICD-10-CM | POA: Insufficient documentation

## 2023-02-25 DIAGNOSIS — R42 Dizziness and giddiness: Secondary | ICD-10-CM | POA: Insufficient documentation

## 2023-02-25 DIAGNOSIS — I10 Essential (primary) hypertension: Secondary | ICD-10-CM | POA: Insufficient documentation

## 2023-02-25 DIAGNOSIS — K589 Irritable bowel syndrome without diarrhea: Secondary | ICD-10-CM | POA: Insufficient documentation

## 2023-03-03 ENCOUNTER — Ambulatory Visit: Payer: Medicare HMO | Attending: Cardiology | Admitting: Cardiology

## 2023-03-03 ENCOUNTER — Encounter: Payer: Self-pay | Admitting: Cardiology

## 2023-03-03 VITALS — BP 140/66 | HR 88 | Ht 64.0 in | Wt 217.0 lb

## 2023-03-03 DIAGNOSIS — E669 Obesity, unspecified: Secondary | ICD-10-CM

## 2023-03-03 DIAGNOSIS — E7849 Other hyperlipidemia: Secondary | ICD-10-CM

## 2023-03-03 DIAGNOSIS — R0609 Other forms of dyspnea: Secondary | ICD-10-CM

## 2023-03-03 DIAGNOSIS — E785 Hyperlipidemia, unspecified: Secondary | ICD-10-CM

## 2023-03-03 DIAGNOSIS — I700 Atherosclerosis of aorta: Secondary | ICD-10-CM | POA: Insufficient documentation

## 2023-03-03 DIAGNOSIS — R011 Cardiac murmur, unspecified: Secondary | ICD-10-CM

## 2023-03-03 HISTORY — DX: Cardiac murmur, unspecified: R01.1

## 2023-03-03 HISTORY — DX: Other hyperlipidemia: E78.49

## 2023-03-03 HISTORY — DX: Obesity, unspecified: E66.9

## 2023-03-03 HISTORY — DX: Atherosclerosis of aorta: I70.0

## 2023-03-03 HISTORY — DX: Other forms of dyspnea: R06.09

## 2023-03-03 MED ORDER — ROSUVASTATIN CALCIUM 10 MG PO TABS: 10.0000 mg | ORAL_TABLET | Freq: Every day | ORAL | 3 refills | Status: DC

## 2023-03-03 NOTE — Progress Notes (Signed)
Cardiology Office Note:    Date:  03/03/2023   ID:  Katelyn Porter, DOB Sep 14, 1950, MRN [id number]  PCP:  Leola Brazil, DO  Cardiologist:  Garwin Brothers, MD   Referring MD: Jeannie Fend, PA-C    ASSESSMENT:    1. Hyperlipidemia, unspecified hyperlipidemia type   2. DOE (dyspnea on exertion)   3. Familial hyperlipidemia   4. Obesity (BMI 35.0-39.9 without comorbidity)   5. Dyspnea on exertion   6. Cardiac murmur    PLAN:    In order of problems listed above:  Primary prevention stressed with the patient.  Importance of compliance with diet medication stressed and patient verbalized standing. Familial dyslipidemia: Lipids are markedly elevated.  Will do baseline liver panel today.  I would like to switch to Crestor 20 mg daily and follow-up with her lipids.  She will need more medications than 2 statins I presume in view of markedly elevated LDL.  I discussed this with her.  Diet was emphasized. Aortic atherosclerosis: Discussed this finding with her at length.  Goal LDL must be less than 70. Dyspnea on exertion: We will look for ischemic substrate with Lexiscan sestamibi. Cardiac murmur: Echocardiogram will be done to assess murmur heard on auscultation. Obesity: Weight reduction stressed.  Diet emphasized and patient promises to do better. Patient will be seen in follow-up appointment in 6 months or earlier if the patient has any concerns.    Medication Adjustments/Labs and Tests Ordered: Current medicines are reviewed at length with the patient today.  Concerns regarding medicines are outlined above.  Orders Placed This Encounter  Procedures   Comprehensive metabolic panel   Comprehensive metabolic panel   Lipid panel   MYOCARDIAL PERFUSION IMAGING   EKG 12-Lead   ECHOCARDIOGRAM COMPLETE   Meds ordered this encounter  Medications   rosuvastatin (CRESTOR) 10 MG tablet    Sig: Take 1 tablet (10 mg total) by mouth daily.    Dispense:  90 tablet     Refill:  3     History of Present Illness:    Katelyn Porter is a 72 y.o. female who is being seen today for the evaluation of dyspnea on exertion at the request of Jeannie Fend, PA-C.  Patient is a pleasant 72 year old female.  She has past medical history of mixed dyslipidemia or other familial hyperlipidemia.  She leads a sedentary lifestyle and is obese.  She gives history of dyspnea on exertion.  She has cough that has been bothering her for the past several months.  No chest pain orthopnea or PND.  At the time of my evaluation, the patient is alert awake oriented and in no distress.  Her lipids are markedly elevated.  Past Medical History:  Diagnosis Date   Acute systolic congestive heart failure (HCC) 03/17/2022   At risk for sleep apnea 12/19/2021   Bilateral carpal tunnel syndrome 10/27/2019   Breast cancer (HCC) 2008   left, with radiation finished 08, tamoxifen   Breast cancer (HCC) 2019   Rt. 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   Last Assessment & Plan:   Stable off meds for last month. Will monitor.  Last Assessment & Plan:   Stable on current meds, continue same.   Last Assessment & Plan:   Stable off meds for last month. Will monitor.     Family history of breast cancer    Family history of colon cancer    Family history of ovarian cancer  07/21/2017   Gastro-esophageal reflux disease without esophagitis 01/24/2013   Overview:   Last Assessment & Plan:   Improved on prilosec.   Last Assessment & Plan:   Improved on prilosec.   Last Assessment & Plan:   Improved on prilosec.   Last Assessment & Plan:   Improved on prilosec.     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  The Hereditary Gene Panel offered by [facility] includes sequencing and/or deletion duplication testing of the following 47 genes: APC, ATM, AXIN2, BARD1, BMPR1A, BRCA1, BRCA2, BRIP1, CDH1, CDK4, CDKN2A (p14ARF), CDKN2A (p16INK4a),    Hallux valgus (acquired), right foot 08/11/2016   Hand numbness 10/27/2019   HFrEF (heart failure with reduced ejection fraction) (HCC) 03/17/2022   Hot flashes related to aromatase inhibitor therapy 04/21/2018   Hyperlipidemia    Hypertension    IBS (irritable bowel syndrome)    IFG (impaired fasting glucose) 06/18/2021   Impacted cerumen of right ear 02/27/2019   Irritable larynx 06/12/2021   Loss of consciousness (HCC) 10/10/2021   Major depressive disorder, single episode 01/24/2013   Last Assessment & Plan:   Stable on zoloft, continue same.   Last Assessment & Plan:   Stable on zoloft, continue same.     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   Tinnitus 08/07/2019   Trigeminal neuralgia of right side of face 01/13/2023   Right ear     Type 2 diabetes mellitus without complication, without long-term current use of insulin (HCC) 05/07/2022   Ulnar nerve entrapment at elbow, left 10/27/2019   Vertigo    Vitamin D deficiency 08/12/2017    Past Surgical History:  Procedure Laterality Date   ABDOMINAL HYSTERECTOMY  1985   TAH (ovaries remain)   BREAST LUMPECTOMY Left 2007   BREAST LUMPECTOMY Right 2019   BREAST LUMPECTOMY WITH RADIOACTIVE SEED AND SENTINEL LYMPH NODE BIOPSY Right 07/22/2017   Procedure: BREAST LUMPECTOMY WITH RADIOACTIVE SEED AND SENTINEL LYMPH NODE BIOPSY;  Surgeon: Harriette Bouillon, MD;  Location: [facility];  Service: General;  Laterality: Right;   COLONOSCOPY     TONSILLECTOMY     age 55    Current Medications: Current Meds  Medication Sig   anastrozole (ARIMIDEX) 1 MG tablet Take 1 tablet (1 mg total) by mouth daily.   Cholecalciferol (VITAMIN D3) 1000 units CAPS Take 1 capsule by mouth daily.   DULoxetine (CYMBALTA) 60 MG capsule Take 60 mg by mouth daily.    fluticasone (FLONASE) 50 MCG/ACT nasal spray Place 1 spray into both nostrils 2 (two) times daily.   Multiple Vitamins-Minerals (ONE-A-DAY WOMENS 50 PLUS PO) Take 1 tablet by mouth daily.   pantoprazole (PROTONIX) 40 MG tablet Take 40 mg by mouth daily.   rosuvastatin (CRESTOR) 10 MG tablet Take 1 tablet (10 mg total) by mouth daily.   WELLBUTRIN SR 150 MG 12 hr tablet Take 150 mg by mouth daily.   [DISCONTINUED] pravastatin (PRAVACHOL) 80 MG tablet Take 80 mg by mouth daily.     Allergies:   Crab extract, Crab [shellfish allergy],  Sulfa antibiotics, Lipitor [atorvastatin], and Sulfa drugs cross reactors   Social History   Socioeconomic History   Marital status: Widowed    Spouse name: Not on file   Number of children: 0   Years of education: Not on file   Highest education level: Not on file  Occupational History   Occupation: accounts payable    Employer: [employer]  Tobacco Use   Smoking status: Never   Smokeless tobacco: Never  Vaping Use   Vaping status: Never Used  Substance and Sexual Activity   Alcohol use: No   Drug use: No   Sexual activity: Not Currently    Birth control/protection: Surgical    Comment: hysterectomy  Other Topics Concern   Not on file  Social History Narrative   No children   Married   Enjoys quilting and horseback riding.    She works in [employer].   She completed 2 years of college.    Social Drivers of Health   Financial Resource Strain: Medium Risk (06/11/2021)   [facility] to [facility]16[facility], [facility] visits prior to 05/16/2022., [facility]   Overall Financial Resource Strain (CARDIA)    Difficulty of Paying Living Expenses: Somewhat hard  Food Insecurity: Low Risk  (09/27/2022)   [facility] from [facility]   Hunger Vital Sign    Worried About Running Out of Food in the Last Year: Never true    Ran Out of Food in the Last Year: Never true  Transportation Needs: Not on file (09/27/2022)  Physical Activity: Inactive (06/11/2021)   [facility] to 05/16/2022[facility], [facility] visits prior to 05/16/2022., [facility]   Exercise Vital Sign    Days of Exercise per Week: 0 days    Minutes of Exercise per Session: 0 min  Stress: Stress Concern Present (06/11/2021)   [facility] prior to 05/16/2022[facility], [facility]  visits prior to 05/16/2022., [facility]   [employer] of Occupational Health - Occupational Stress Questionnaire    Feeling of Stress : To some extent  Social Connections: Moderately Isolated (06/11/2021)   Received from [facility] visits prior to 05/16/2022[facility], [facility] visits  prior to 05/16/2022., [facility]   Social Connection and Isolation Panel [NHANES]    Frequency of Communication with Friends and Family: More than three times a week    Frequency of Social Gatherings with Friends and Family: Never    Attends Religious Services: More than 4 times per year    Active Member of [employer] or Organizations: No    Attends Banker Meetings: Never    Marital Status: Widowed     Family History: The patient's family history includes Allergic rhinitis in her sister; Asthma in her brother; Breast cancer in her cousin and maternal aunt; Colon cancer in her cousin; Colon cancer (age of onset: 5) in her mother; Colon polyps in her sister; Coronary artery disease (age of onset: 78) in her brother; Diabetes in her brother, mother, and sister; Heart disease in her father; Hypertension in her mother and sister; Kidney disease in her mother; Lung cancer in her father; Ovarian cancer in her paternal grandmother. There is no history of Esophageal cancer, Stomach cancer, Rectal cancer, Pancreatic cancer, or Liver disease.  ROS:   Please see the history of present illness.    All other systems reviewed and are negative.  EKGs/Labs/Other Studies Reviewed:    The following studies were reviewed today:  EKG Interpretation Date/Time:  Wednesday March 03 2023 10:44:07 EST Ventricular Rate:  88 PR Interval:  132 QRS Duration:  88 QT Interval:  360 QTC Calculation: 435 R Axis:   33  Text Interpretation: Normal sinus rhythm Nonspecific T wave abnormality When compared with ECG of 06-Jan-2023 08:28, PREVIOUS ECG IS PRESENT  Confirmed by Belva Crome [id number]) on 03/03/2023 10:56:42 AM     Recent Labs: 01/26/2023: ALT 13; BUN 14; Creatinine 0.92; Hemoglobin 13.9; Platelet Count 287; Potassium 3.8; Sodium 140  Recent Lipid Panel    Component Value Date/Time   CHOL 216 (H) 06/09/2016 0854   TRIG 206 (H) 06/09/2016 0854   HDL 36 (L) 06/09/2016 0854   CHOLHDL 6.0 (H) 06/09/2016 0854   VLDL 41 (H) 06/09/2016 0854   LDLCALC 139 (H) 06/09/2016 0854    Physical Exam:    VS:  BP (!) 140/66   Pulse 88   Ht 5\' 4"  (1.626 m)   Wt 217 lb (98.4 kg)   LMP 03/17/1983   SpO2 97%   BMI 37.25 kg/m     Wt Readings from Last 3 Encounters:  03/03/23 217 lb (98.4 kg)  01/26/23 214 lb 14.4 oz (97.5 kg)  01/20/23 212 lb (96.2 kg)     GEN: Patient is in no acute distress HEENT: Normal NECK: No JVD; No carotid bruits LYMPHATICS: No lymphadenopathy CARDIAC: S1 S2 regular, 2/6 systolic murmur at the apex. RESPIRATORY:  Clear to auscultation without rales, wheezing or rhonchi  ABDOMEN: Soft, non-tender, non-distended MUSCULOSKELETAL:  No edema; No deformity  SKIN: Warm and dry NEUROLOGIC:  Alert and oriented x 3 PSYCHIATRIC:  Normal affect    Signed, Garwin Brothers, MD  03/03/2023 11:37 AM    [facility]

## 2023-03-03 NOTE — Patient Instructions (Addendum)
Medication Instructions:  Your physician has recommended you make the following change in your medication:   Start Crestor 20 mg daily.  Stop Pravastatin  *If you need a refill on your cardiac medications before your next appointment, please call your pharmacy*   Lab Work: Your physician recommends that you have a CMP today in the office.  Your physician recommends that you return for lab work in: 6 weeks for CMP and lipids. You need to have labs done when you are fasting. [facility] lab is located on the 3rd floor, [address]. Hours are Monday - Friday 8 am to 4 pm, closed 11:30 am to 1:00 pm. You do NOT need an appointment.   If you have labs (blood work) drawn today and your tests are completely normal, you will receive your results only by: MyChart Message (if you have MyChart) OR A paper copy in the mail If you have any lab test that is abnormal or we need to change your treatment, we will call you to review the results.   Testing/Procedures:   [facility] at [facility] [address], [address] [place], [place] [address] Phone: [phone]    Please arrive 15 minutes prior to your appointment time for registration and insurance purposes.  The test will take approximately 3 to 4 hours to complete; you may bring reading material.  If someone comes with you to your appointment, they will need to remain in the main lobby due to limited space in the testing area. **If you are pregnant or breastfeeding, please notify the nuclear lab prior to your appointment**  How to prepare for your Myocardial Perfusion Test: Do not eat or drink 3 hours prior to your test, except you may have water. Do not consume products containing caffeine (regular or decaffeinated) 12 hours prior to your test. (ex: coffee, chocolate, sodas, tea). Do bring a list of your current medications with you.  If not listed below, you may take your medications as normal. Do wear  comfortable clothes (no dresses or overalls) and walking shoes, tennis shoes preferred (No heels or open toe shoes are allowed). Do NOT wear cologne, perfume, aftershave, or lotions (deodorant is allowed). If these instructions are not followed, your test will have to be rescheduled.  If you cannot keep your appointment, please provide 24 hours notification to the Nuclear Lab, to avoid a possible $50 charge to your account. Please report to [address], [address] for your test.  If you have questions or concerns about your appointment, you can call the Nuclear Lab at [phone].   If you cannot keep your appointment, please provide 24 hours notification to the Nuclear Lab, to avoid a possible $50 charge to your account.    Your physician has requested that you have an echocardiogram. Echocardiography is a painless test that uses sound waves to create images of your heart. It provides your doctor with information about the size and shape of your heart and how well your heart's chambers and valves are working. This procedure takes approximately one hour. There are no restrictions for this procedure. Please do NOT wear cologne, perfume, aftershave, or lotions (deodorant is allowed). Please arrive 15 minutes prior to your appointment time.  Please note: We ask at that you not bring children with you during ultrasound (echo/ vascular) testing. Due to room size and safety concerns, children are not allowed in the ultrasound rooms during exams. Our front office staff cannot provide observation of children  in our lobby area while testing is being conducted. An adult accompanying a patient to their appointment will only be allowed in the ultrasound room at the discretion of the ultrasound technician under special circumstances. We apologize for any inconvenience.    Follow-Up: At [facility], you and your health needs are our priority.  As part of our continuing mission to provide you  with exceptional heart care, we have created designated Provider Care Teams.  These Care Teams include your primary Cardiologist (physician) and Advanced Practice Providers (APPs -  Physician Assistants and Nurse Practitioners) who all work together to provide you with the care you need, when you need it.  We recommend signing up for the patient portal called "MyChart".  Sign up information is provided on this After Visit Summary.  MyChart is used to connect with patients for Virtual Visits (Telemedicine).  Patients are able to view lab/test results, encounter notes, upcoming appointments, etc.  Non-urgent messages can be sent to your provider as well.   To learn more about what you can do with MyChart, go to [email or web address].    Your next appointment:   2 month(s)  Provider:   Belva Crome, MD   Other Instructions  Cardiac Nuclear Scan A cardiac nuclear scan is a test that is done to check the flow of blood to your heart. It is done when you are resting and when you are exercising. The test looks for problems such as: Not enough blood reaching a portion of the heart. The heart muscle not working as it should. You may need this test if you have: Heart disease. Lab results that are not normal. Had heart surgery or a balloon procedure to open up blocked arteries (angioplasty) or a small mesh tube (stent). Chest pain. Shortness of breath. Had a heart attack. In this test, a special dye (tracer) is put into your bloodstream. The tracer will travel to your heart. A camera will then take pictures of your heart to see how the tracer moves through your heart. This test is usually done at a hospital and takes 2-4 hours. Tell a doctor about: Any allergies you have. All medicines you are taking, including vitamins, herbs, eye drops, creams, and over-the-counter medicines. Any bleeding problems you have. Any surgeries you have had. Any medical conditions you have. Whether you are pregnant  or may be pregnant. Any history of asthma or long-term (chronic) lung disease. Any history of heart rhythm disorders or heart valve conditions. What are the risks? Your doctor will talk with you about risks. These may include: Serious chest pain and heart attack. This is only a risk if the stress portion of the test is done. Fast or uneven heartbeats (palpitations). A feeling of warmth in your chest. This feeling usually does not last long. Allergic reaction to the tracer. Shortness of breath or trouble breathing. What happens before the test? Ask your doctor about changing or stopping your normal medicines. Follow instructions from your doctor about what you cannot eat or drink. Remove your jewelry on the day of the test. Ask your doctor if you need to avoid nicotine or caffeine. What happens during the test? An IV tube will be inserted into one of your veins. Your doctor will give you a small amount of tracer through the IV tube. You will wait for 20-40 minutes while the tracer moves through your bloodstream. Your heart will be monitored with an electrocardiogram (ECG). You will lie down on an exam table. Pictures of  your heart will be taken for about 15-20 minutes. You may also have a stress test. For this test, one of these things may be done: You will be asked to exercise on a treadmill or a stationary bike. You will be given medicines that will make your heart work harder. This is done if you are unable to exercise. When blood flow to your heart has peaked, a tracer will again be given through the IV tube. After 20-40 minutes, you will get back on the exam table. More pictures will be taken of your heart. Depending on the tracer that is used, more pictures may need to be taken 3-4 hours later. Your IV tube will be removed when the test is over. The test may vary among doctors and hospitals. What happens after the test? Ask your doctor: Whether you can return to your normal  schedule, including diet, activities, travel, and medicines. Whether you should drink more fluids. This will help to remove the tracer from your body. Ask your doctor, or the department that is doing the test: When will my results be ready? How will I get my results? What are my treatment options? What other tests do I need? What are my next steps? This information is not intended to replace advice given to you by your health care provider. Make sure you discuss any questions you have with your health care provider. Document Revised: 07/29/2021 Document Reviewed: 07/29/2021 Elsevier Patient Education  2023 [employer]  Echocardiogram An echocardiogram is a test that uses sound waves (ultrasound) to produce images of the heart. Images from an echocardiogram can provide important information about: Heart size and shape. The size and thickness and movement of your heart's walls. Heart muscle function and strength. Heart valve function or if you have stenosis. Stenosis is when the heart valves are too narrow. If blood is flowing backward through the heart valves (regurgitation). A tumor or infectious growth around the heart valves. Areas of heart muscle that are not working well because of poor blood flow or injury from a heart attack. Aneurysm detection. An aneurysm is a weak or damaged part of an artery wall. The wall bulges out from the normal force of blood pumping through the body. Tell a health care provider about: Any allergies you have. All medicines you are taking, including vitamins, herbs, eye drops, creams, and over-the-counter medicines. Any blood disorders you have. Any surgeries you have had. Any medical conditions you have. Whether you are pregnant or may be pregnant. What are the risks? Generally, this is a safe test. However, problems may occur, including an allergic reaction to dye (contrast) that may be used during the test. What happens before the test? No  specific preparation is needed. You may eat and drink normally. What happens during the test?  You will take off your clothes from the waist up and put on a hospital gown. Electrodes or electrocardiogram (ECG)patches may be placed on your chest. The electrodes or patches are then connected to a device that monitors your heart rate and rhythm. You will lie down on a table for an ultrasound exam. A gel will be applied to your chest to help sound waves pass through your skin. A handheld device, called a transducer, will be pressed against your chest and moved over your heart. The transducer produces sound waves that travel to your heart and bounce back (or "echo" back) to the transducer. These sound waves will be captured in real-time and changed into images of  your heart that can be viewed on a video monitor. The images will be recorded on a computer and reviewed by your health care provider. You may be asked to change positions or hold your breath for a short time. This makes it easier to get different views or better views of your heart. In some cases, you may receive contrast through an IV in one of your veins. This can improve the quality of the pictures from your heart. The procedure may vary among health care providers and hospitals. What can I expect after the test? You may return to your normal, everyday life, including diet, activities, and medicines, unless your health care provider tells you not to do that. Follow these instructions at home: It is up to you to get the results of your test. Ask your health care provider, or the department that is doing the test, when your results will be ready. Keep all follow-up visits. This is important. Summary An echocardiogram is a test that uses sound waves (ultrasound) to produce images of the heart. Images from an echocardiogram can provide important information about the size and shape of your heart, heart muscle function, heart valve function, and  other possible heart problems. You do not need to do anything to prepare before this test. You may eat and drink normally. After the echocardiogram is completed, you may return to your normal, everyday life, unless your health care provider tells you not to do that. This information is not intended to replace advice given to you by your health care provider. Make sure you discuss any questions you have with your health care provider. Document Revised: 11/13/2020 Document Reviewed: 10/24/2019 Elsevier Patient Education  2023 [employer] Inc.    Important Information About Sugar

## 2023-03-04 LAB — COMPREHENSIVE METABOLIC PANEL
ALT: 18 [IU]/L (ref 0–32)
AST: 18 [IU]/L (ref 0–40)
Albumin: 4.4 g/dL (ref 3.8–4.8)
Alkaline Phosphatase: 105 [IU]/L (ref 44–121)
BUN/Creatinine Ratio: 16 (ref 12–28)
BUN: 15 mg/dL (ref 8–27)
Bilirubin Total: 0.6 mg/dL (ref 0.0–1.2)
CO2: 24 mmol/L (ref 20–29)
Calcium: 9.4 mg/dL (ref 8.7–10.3)
Chloride: 106 mmol/L (ref 96–106)
Creatinine, Ser: 0.94 mg/dL (ref 0.57–1.00)
Globulin, Total: 2 g/dL (ref 1.5–4.5)
Glucose: 83 mg/dL (ref 70–99)
Potassium: 4.5 mmol/L (ref 3.5–5.2)
Sodium: 144 mmol/L (ref 134–144)
Total Protein: 6.4 g/dL (ref 6.0–8.5)
eGFR: 65 mL/min/{1.73_m2} (ref >59)

## 2023-03-13 ENCOUNTER — Ambulatory Visit
Admission: RE | Admit: 2023-03-13 | Discharge: 2023-03-13 | Disposition: A | Discharge status: Home / Self Care | Payer: Medicare HMO | Source: Ambulatory Visit | Attending: Hematology and Oncology | Admitting: Hematology and Oncology

## 2023-03-13 DIAGNOSIS — C50411 Malignant neoplasm of upper-outer quadrant of right female breast: Secondary | ICD-10-CM

## 2023-03-13 MED ORDER — GADOPICLENOL 0.5 MMOL/ML IV SOLN
10.0000 mL | Freq: Once | INTRAVENOUS | Status: AC | PRN
  Administered 2023-03-13: 10 mL via INTRAVENOUS

## 2023-04-05 ENCOUNTER — Telehealth (HOSPITAL_COMMUNITY): Payer: Self-pay | Admitting: *Deleted

## 2023-04-05 ENCOUNTER — Encounter: Payer: Self-pay | Admitting: Cardiology

## 2023-04-05 NOTE — Telephone Encounter (Signed)
Left message on voicemail per DPR in reference to upcoming appointment scheduled on 04/12/2023 at 8:00 with detailed instructions given per Myocardial Perfusion Study Information Sheet for the test. LM to arrive 15 minutes early, and that it is imperative to arrive on time for appointment to keep from having the test rescheduled. If you need to cancel or reschedule your appointment, please call the office within 24 hours of your appointment. Failure to do so may result in a cancellation of your appointment, and a $50 no show fee. Phone number given for call back for any questions.

## 2023-04-06 ENCOUNTER — Ambulatory Visit (HOSPITAL_BASED_OUTPATIENT_CLINIC_OR_DEPARTMENT_OTHER)
Admission: RE | Admit: 2023-04-06 | Discharge: 2023-04-06 | Disposition: A | Discharge status: Home / Self Care | Payer: HMO | Source: Ambulatory Visit | Attending: Cardiology | Admitting: Cardiology

## 2023-04-06 DIAGNOSIS — R0609 Other forms of dyspnea: Secondary | ICD-10-CM | POA: Diagnosis present

## 2023-04-06 LAB — ECHOCARDIOGRAM COMPLETE
AV Mean grad: 5 mm[Hg]
AV Peak grad: 9.5 mm[Hg]
AV Vena cont: 0.4 cm
Ao pk vel: 1.54 m/s
Area-P 1/2: 4.29 cm2
Calc EF: 41.7 %
MV M vel: 5.66 m/s
MV Peak grad: 128.1 mm[Hg]
MV Vena cont: 0.2 cm
P 1/2 time: 322 ms
Radius: 0.4 cm
S' Lateral: 5.3 cm
Single Plane A2C EF: 42.2 %
Single Plane A4C EF: 44.4 %

## 2023-04-07 ENCOUNTER — Telehealth: Payer: Self-pay | Admitting: Cardiology

## 2023-04-07 NOTE — Telephone Encounter (Signed)
Patient is returning call and is requesting call back in regards to results.

## 2023-04-07 NOTE — Telephone Encounter (Signed)
Patient informed of results.  

## 2023-04-10 ENCOUNTER — Encounter: Payer: Self-pay | Admitting: Hematology and Oncology

## 2023-04-12 ENCOUNTER — Telehealth: Payer: Self-pay | Admitting: *Deleted

## 2023-04-12 ENCOUNTER — Ambulatory Visit (HOSPITAL_COMMUNITY): Payer: HMO | Attending: Cardiology

## 2023-04-12 DIAGNOSIS — R0609 Other forms of dyspnea: Secondary | ICD-10-CM | POA: Diagnosis not present

## 2023-04-12 LAB — MYOCARDIAL PERFUSION IMAGING
LV dias vol: 187 mL (ref 46–106)
LV sys vol: 130 mL
Nuc Stress EF: 30 %
Peak HR: 100 {beats}/min
Rest HR: 80 {beats}/min
Rest Nuclear Isotope Dose: 9.5 mCi
SDS: 2
SRS: 1
SSS: 3
ST Depression (mm): 0 mm
Stress Nuclear Isotope Dose: 31.2 mCi
TID: 1.16

## 2023-04-12 MED ORDER — REGADENOSON 0.4 MG/5ML IV SOLN
0.4000 mg | Freq: Once | INTRAVENOUS | Status: AC
  Administered 2023-04-12: 0.4 mg via INTRAVENOUS

## 2023-04-12 MED ORDER — TECHNETIUM TC 99M TETROFOSMIN IV KIT
9.5000 | PACK | Freq: Once | INTRAVENOUS | Status: AC | PRN
  Administered 2023-04-12: 9.5 via INTRAVENOUS

## 2023-04-12 MED ORDER — TECHNETIUM TC 99M TETROFOSMIN IV KIT
31.2000 | PACK | Freq: Once | INTRAVENOUS | Status: AC | PRN
Start: 2023-04-12 — End: 2023-04-12
  Administered 2023-04-12: 31.2 via INTRAVENOUS

## 2023-04-12 NOTE — Telephone Encounter (Signed)
Per addendum from radiologist per recent MRI- area of noted concern on previous US showing as benign. Recommended follow with yearly mammo.  [place] appt's canceled by this RN.  See My Chart communication for further information.  Pt aware of the above.

## 2023-04-30 ENCOUNTER — Ambulatory Visit: Payer: HMO | Attending: Cardiology | Admitting: Cardiology

## 2023-04-30 ENCOUNTER — Encounter: Payer: Self-pay | Admitting: Cardiology

## 2023-04-30 VITALS — BP 120/64 | HR 77 | Ht 64.0 in | Wt 215.0 lb

## 2023-04-30 DIAGNOSIS — I10 Essential (primary) hypertension: Secondary | ICD-10-CM | POA: Diagnosis not present

## 2023-04-30 DIAGNOSIS — R9439 Abnormal result of other cardiovascular function study: Secondary | ICD-10-CM

## 2023-04-30 DIAGNOSIS — R0609 Other forms of dyspnea: Secondary | ICD-10-CM | POA: Diagnosis not present

## 2023-04-30 DIAGNOSIS — E119 Type 2 diabetes mellitus without complications: Secondary | ICD-10-CM | POA: Diagnosis not present

## 2023-04-30 DIAGNOSIS — Z6835 Body mass index (BMI) 35.0-35.9, adult: Secondary | ICD-10-CM

## 2023-04-30 DIAGNOSIS — I5020 Unspecified systolic (congestive) heart failure: Secondary | ICD-10-CM

## 2023-04-30 MED ORDER — ASPIRIN 81 MG PO TBEC: 81.0000 mg | DELAYED_RELEASE_TABLET | Freq: Every day | ORAL | Status: AC

## 2023-04-30 MED ORDER — NITROGLYCERIN 0.4 MG SL SUBL
0.4000 mg | SUBLINGUAL_TABLET | SUBLINGUAL | 6 refills | Status: DC | PRN
Start: 2023-04-30 — End: 2023-05-19

## 2023-04-30 NOTE — Patient Instructions (Signed)
Medication Instructions:  Your physician has recommended you make the following change in your medication:   Take 81 mg coated aspirin daily.  Use nitroglycerin 1 tablet placed under the tongue at the first sign of chest pain or an angina attack. 1 tablet may be used every 5 minutes as needed, for up to 15 minutes. Do not take more than 3 tablets in 15 minutes. If pain persist call 911 or go to the nearest ED.   *If you need a refill on your cardiac medications before your next appointment, please call your pharmacy*   Lab Work: Your physician recommends that you have a BMET and CBC today in the office for your upcoming procedure.  If you have labs (blood work) drawn today and your tests are completely normal, you will receive your results only by: MyChart Message (if you have MyChart) OR A paper copy in the mail If you have any lab test that is abnormal or we need to change your treatment, we will call you to review the results.   Testing/Procedures:  [place] [facility] DEPT OF [facility] AT [facility] [place] [address] [place], [place] [address] [place] [address] Dept: [phone] Loc: [phone]  Katelyn Porter  04/30/2023  You are scheduled for a Cardiac Catheterization on Monday, February 17 with Dr. Cristal Deer End.  1. Please arrive at the [facility] (Main Entrance A) at [facility]: [address] [place], [place] [address] at 5:30 AM (This time is 2 hour(s) before your procedure to ensure your preparation).   Free valet parking service is available. You will check in at ADMITTING. The support person will be asked to wait in the waiting room.  It is OK to have someone drop you off and come back when you are ready to be discharged.    Special note: Every effort is made to have your procedure done on time. Please understand that emergencies sometimes delay scheduled procedures.  2. Diet: Do not eat solid  foods after midnight.  The patient may have clear liquids until 5am upon the day of the procedure.  3. Labs: You had your labs done today at the office.  4. Medication instructions in preparation for your procedure:   Contrast Allergy: No  On the morning of your procedure, take your Aspirin 81 mg and any morning medicines NOT listed above.  You may use sips of water.  5. Plan to go home the same day, you will only stay overnight if medically necessary. 6. Bring a current list of your medications and current insurance cards. 7. You MUST have a responsible person to drive you home. 8. Someone MUST be with you the first 24 hours after you arrive home or your discharge will be delayed. 9. Please wear clothes that are easy to get on and off and wear slip-on shoes.  Thank you for allowing [place] to care for you!   -- [facility]    Follow-Up: At [facility], you and your health needs are our priority.  As part of our continuing mission to provide you with exceptional heart care, we have created designated Provider Care Teams.  These Care Teams include your primary Cardiologist (physician) and Advanced Practice Providers (APPs -  Physician Assistants and Nurse Practitioners) who all work together to provide you with the care you need, when you need it.  We recommend signing up for the patient portal called "MyChart".  Sign up information is provided on this After Visit Summary.  MyChart is used to connect with patients for Virtual Visits (Telemedicine).  Patients are able to view lab/test results, encounter notes, upcoming appointments, etc.  Non-urgent messages can be sent to your provider as well.   To learn more about what you can do with MyChart, go to [email or web address].    Your next appointment:   6 month(s)  The format for your next appointment:   In Person  Provider:   Belva Crome, MD   Other Instructions  Coronary Angiogram With  Stent Coronary angiogram with stent placement is a procedure to widen or open a narrow blood vessel of the heart (coronary artery). Arteries may become blocked by cholesterol buildup (plaques) in the lining of the artery wall. When a coronary artery becomes partially blocked, blood flow to that area decreases. This may lead to chest pain or a heart attack (myocardial infarction). A stent is a small piece of metal that looks like mesh or spring. Stent placement may be done as treatment after a heart attack, or to prevent a heart attack if a blocked artery is found by a coronary angiogram. Let your health care provider know about: Any allergies you have, including allergies to medicines or contrast dye. All medicines you are taking, including vitamins, herbs, eye drops, creams, and over-the-counter medicines. Any problems you or family members have had with anesthetic medicines. Any blood disorders you have. Any surgeries you have had. Any medical conditions you have, including kidney problems or kidney failure. Whether you are pregnant or may be pregnant. Whether you are breastfeeding. What are the risks? Generally, this is a safe procedure. However, serious problems may occur, including: Damage to nearby structures or organs, such as the heart, blood vessels, or kidneys. A return of blockage. Bleeding, infection, or bruising at the insertion site. A collection of blood under the skin (hematoma) at the insertion site. A blood clot in another part of the body. Allergic reaction to medicines or dyes. Bleeding into the abdomen (retroperitoneal bleeding). Stroke (rare). Heart attack (rare). What happens before the procedure? Staying hydrated Follow instructions from your health care provider about hydration, which may include: Up to 2 hours before the procedure - you may continue to drink clear liquids, such as water, clear fruit juice, black coffee, and plain tea.    Eating and drinking  restrictions Follow instructions from your health care provider about eating and drinking, which may include: 8 hours before the procedure - stop eating heavy meals or foods, such as meat, fried foods, or fatty foods. 6 hours before the procedure - stop eating light meals or foods, such as toast or cereal. 2 hours before the procedure - stop drinking clear liquids. Medicines Ask your health care provider about: Changing or stopping your regular medicines. This is especially important if you are taking diabetes medicines or blood thinners. Taking medicines such as aspirin and ibuprofen. These medicines can thin your blood. Do not take these medicines unless your health care provider tells you to take them. Generally, aspirin is recommended before a thin tube, called a catheter, is passed through a blood vessel and inserted into the heart (cardiac catheterization). Taking over-the-counter medicines, vitamins, herbs, and supplements. General instructions Do not use any products that contain nicotine or tobacco for at least 4 weeks before the procedure. These products include cigarettes, e-cigarettes, and chewing tobacco. If you need help quitting, ask your health care provider. Plan to have someone take you  home from the hospital or clinic. If you will be going home right after the procedure, plan to have someone with you for 24 hours. You may have tests and imaging procedures. Ask your health care provider: How your insertion site will be marked. Ask which artery will be used for the procedure. What steps will be taken to help prevent infection. These may include: Removing hair at the insertion site. Washing skin with a germ-killing soap. Taking antibiotic medicine. What happens during the procedure? An IV will be inserted into one of your veins. Electrodes may be placed on your chest to monitor your heart rate during the procedure. You will be given one or more of the following: A medicine  to help you relax (sedative). A medicine to numb the area (local anesthetic) for catheter insertion. A small incision will be made for catheter insertion. The catheter will be inserted into an artery using a guide wire. The location may be in your groin, your wrist, or the fold of your arm (near your elbow). An X-ray procedure (fluoroscopy) will be used to help guide the catheter to the opening of the heart arteries. A dye will be injected into the catheter. X-rays will be taken. The dye helps to show where any narrowing or blockages are located in the arteries. Tell your health care provider if you have chest pain or trouble breathing. A tiny wire will be guided to the blocked spot, and a balloon will be inflated to make the artery wider. The stent will be expanded to crush the plaques into the wall of the vessel. The stent will hold the area open and improve the blood flow. Most stents have a drug coating to reduce the risk of the stent narrowing over time. The artery may be made wider using a drill, laser, or other tools that remove plaques. The catheter will be removed when the blood flow improves. The stent will stay where it was placed, and the lining of the artery will grow over it. A bandage (dressing) will be placed on the insertion site. Pressure will be applied to stop bleeding. The IV will be removed. This procedure may vary among health care providers and hospitals.    What happens after the procedure? Your blood pressure, heart rate, breathing rate, and blood oxygen level will be monitored until you leave the hospital or clinic. If the procedure is done through the leg, you will lie flat in bed for a few hours or for as long as told by your health care provider. You will be instructed not to bend or cross your legs. The insertion site and the pulse in your foot or wrist will be checked often. You may have more blood tests, X-rays, and a test that records the electrical activity of  your heart (electrocardiogram, or ECG). Do not drive for 24 hours if you were given a sedative during your procedure. Summary Coronary angiogram with stent placement is a procedure to widen or open a narrowed coronary artery. This is done to treat heart problems. Before the procedure, let your health care provider know about all the medical conditions and surgeries you have or have had. This is a safe procedure. However, some problems may occur, including damage to nearby structures or organs, bleeding, blood clots, or allergies. Follow your health care provider's instructions about eating, drinking, medicines, and other lifestyle changes, such as quitting tobacco use before the procedure. This information is not intended to replace advice given to you by your  health care provider. Make sure you discuss any questions you have with your health care provider. Document Revised: 09/21/2018 Document Reviewed: 09/21/2018 Elsevier Patient Education  2021 Elsevier Inc.  Aspirin and Your Heart Aspirin is a medicine that prevents the platelets in your blood from sticking together. Platelets are the cells that your blood uses for clotting. Aspirin can be used to help reduce the risk of blood clots, heart attacks, and other heart-related problems. What are the risks? Daily use of aspirin can cause side effects. Some of these include: Bleeding. Bleeding can be minor or serious. An example of minor bleeding is bleeding from a cut, and the bleeding does not stop. An example of more serious bleeding is stomach bleeding or, rarely, bleeding into the brain. Your risk of bleeding increases if you are also taking NSAIDs, such as ibuprofen. Increased bruising. Upset stomach. An allergic reaction. People who have growths inside the nose (nasal polyps) have an increased risk of developing an aspirin allergy. How to use aspirin to care for your heart Take aspirin only as told by your health care provider. Make sure  that you understand how much to take and what form to take. The two forms of aspirin are: Non-enteric-coated.This type of aspirin does not have a coating and is absorbed quickly. This type of aspirin also comes in a chewable form. Enteric-coated. This type of aspirin has a coating that releases the medicine very slowly. Enteric-coated aspirin might cause less stomach upset than non-enteric-coated aspirin. This type of aspirin should not be chewed or crushed. Work with your health care provider to find out whether it is safe and beneficial for you to take aspirin daily. Taking aspirin daily may be helpful if: You have had a heart attack or chest pain, or you are at risk for a heart attack. You have a condition in which certain heart vessels are blocked (coronary artery disease), and you have had a procedure to treat it. Examples are: Open-heart surgery, such as coronary artery bypass surgery (CABG). Coronary angioplasty,which is done to widen a blood vessel of your heart. Having a small mesh tube, or stent, placed in your coronary artery. You have had certain types of stroke or a mini-stroke known as a transient ischemic attack (TIA). You have a narrowing of the arteries that supply the limbs (peripheral artery disease, or PAD). You have long-term (chronic) heart rhythm problems, such as atrial fibrillation, and your health care provider thinks aspirin may help. You have valve disease or have had surgery on a valve. You are considered at increased risk of developing coronary artery disease or PAD.    Follow these instructions at home Medicines Take over-the-counter and prescription medicines only as told by your health care provider. If you are taking blood thinners: Talk with your health care provider before you take any medicines that contain aspirin or NSAIDs, such as ibuprofen. These medicines increase your risk for dangerous bleeding. Take your medicine exactly as told, at the same time every  day. Avoid activities that could cause injury or bruising, and follow instructions about how to prevent falls. Wear a medical alert bracelet or carry a card that lists what medicines you take. General instructions Do not drink alcohol if: Your health care provider tells you not to drink. You are pregnant, may be pregnant, or are planning to become pregnant. If you drink alcohol: Limit how much you use to: 0-1 drink a day for women. 0-2 drinks a day for men. Be aware of how  much alcohol is in your drink. In the [place] one drink equals one 12 oz bottle of beer (355 mL), one 5 oz glass of wine (148 mL), or one 1 oz glass of hard liquor (44 mL). Keep all follow-up visits as told by your health care provider. This is important. Where to find more information The American Heart Association: www.heart.org Contact a health care provider if you have: Unusual bleeding or bruising. Stomach pain or nausea. Ringing in your ears. An allergic reaction that causes hives, itchy skin, or swelling of the lips, tongue, or face. Get help right away if: You notice that your bowel movements are bloody, or dark red or black in color. You vomit or cough up blood. You have blood in your urine. You cough, breathe loudly (wheeze), or feel short of breath. You have chest pain, especially if the pain spreads to your arms, back, neck, or jaw. You have a headache with confusion. You have any symptoms of a stroke. "BE FAST" is an easy way to remember the main warning signs of a stroke: B - Balance. Signs are dizziness, sudden trouble walking, or loss of balance. E - Eyes. Signs are trouble seeing or a sudden change in vision. F - Face. Signs are sudden weakness or numbness of the face, or the face or eyelid drooping on one side. A - Arms. Signs are weakness or numbness in an arm. This happens suddenly and usually on one side of the body. S - Speech. Signs are sudden trouble speaking, slurred speech, or trouble  understanding what people say. T - Time. Time to call emergency services. Write down what time symptoms started. You have other signs of a stroke, such as: A sudden, severe headache with no known cause. Nausea or vomiting. Seizure. These symptoms may represent a serious problem that is an emergency. Do not wait to see if the symptoms will go away. Get medical help right away. Call your local emergency services (911 in the [place] Do not drive yourself to the hospital. Summary Aspirin use can help reduce the risk of blood clots, heart attacks, and other heart-related problems. Daily use of aspirin can cause side effects. Take aspirin only as told by your health care provider. Make sure that you understand how much to take and what form to take. Your health care provider will help you determine whether it is safe and beneficial for you to take aspirin daily. This information is not intended to replace advice given to you by your health care provider. Make sure you discuss any questions you have with your health care provider. Document Revised: 12/05/2018 Document Reviewed: 12/05/2018 Elsevier Patient Education  2021 Elsevier Inc. Nitroglycerin sublingual tablets What is this medicine? NITROGLYCERIN (nye troe GLI ser in) is a type of vasodilator. It relaxes blood vessels, increasing the blood and oxygen supply to your heart. This medicine is used to relieve chest pain caused by angina. It is also used to prevent chest pain before activities like climbing stairs, going outdoors in cold weather, or sexual activity. This medicine may be used for other purposes; ask your health care provider or pharmacist if you have questions. COMMON BRAND NAME(S): Nitroquick, Nitrostat, Nitrotab What should I tell my health care provider before I take this medicine? They need to know if you have any of these conditions: anemia head injury, recent stroke, or bleeding in the brain liver disease previous heart  attack an unusual or allergic reaction to nitroglycerin, other medicines, foods, dyes, or preservatives  pregnant or trying to get pregnant breast-feeding How should I use this medicine? Take this medicine by mouth as needed. Use at the first sign of an angina attack (chest pain or tightness). You can also take this medicine 5 to 10 minutes before an event likely to produce chest pain. Follow the directions exactly as written on the prescription label. Place one tablet under your tongue and let it dissolve. Do not swallow whole. Replace the dose if you accidentally swallow it. It will help if your mouth is not dry. Saliva around the tablet will help it to dissolve more quickly. Do not eat or drink, smoke or chew tobacco while a tablet is dissolving. Sit down when taking this medicine. In an angina attack, you should feel better within 5 minutes after your first dose. You can take a dose every 5 minutes up to a total of 3 doses. If you do not feel better or feel worse after 1 dose, call 9-1-1 at once. Do not take more than 3 doses in 15 minutes. Your health care provider might give you other directions. Follow those directions if he or she does. Do not take your medicine more often than directed. Talk to your health care provider about the use of this medicine in children. Special care may be needed. Overdosage: If you think you have taken too much of this medicine contact a poison control center or emergency room at once. NOTE: This medicine is only for you. Do not share this medicine with others. What if I miss a dose? This does not apply. This medicine is only used as needed. What may interact with this medicine? Do not take this medicine with any of the following medications: certain migraine medicines like ergotamine and dihydroergotamine (DHE) medicines used to treat erectile dysfunction like sildenafil, tadalafil, and vardenafil riociguat This medicine may also interact with the following  medications: alteplase aspirin heparin medicines for high blood pressure medicines for mental depression other medicines used to treat angina phenothiazines like chlorpromazine, mesoridazine, prochlorperazine, thioridazine This list may not describe all possible interactions. Give your health care provider a list of all the medicines, herbs, non-prescription drugs, or dietary supplements you use. Also tell them if you smoke, drink alcohol, or use illegal drugs. Some items may interact with your medicine. What should I watch for while using this medicine? Tell your doctor or health care professional if you feel your medicine is no longer working. Keep this medicine with you at all times. Sit or lie down when you take your medicine to prevent falling if you feel dizzy or faint after using it. Try to remain calm. This will help you to feel better faster. If you feel dizzy, take several deep breaths and lie down with your feet propped up, or bend forward with your head resting between your knees. You may get drowsy or dizzy. Do not drive, use machinery, or do anything that needs mental alertness until you know how this drug affects you. Do not stand or sit up quickly, especially if you are an older patient. This reduces the risk of dizzy or fainting spells. Alcohol can make you more drowsy and dizzy. Avoid alcoholic drinks. Do not treat yourself for coughs, colds, or pain while you are taking this medicine without asking your doctor or health care professional for advice. Some ingredients may increase your blood pressure. What side effects may I notice from receiving this medicine? Side effects that you should report to your doctor or health care  professional as soon as possible: allergic reactions (skin rash, itching or hives; swelling of the face, lips, or tongue) low blood pressure (dizziness; feeling faint or lightheaded, falls; unusually weak or tired) low red blood cell counts (trouble breathing;  feeling faint; lightheaded, falls; unusually weak or tired) Side effects that usually do not require medical attention (report to your doctor or health care professional if they continue or are bothersome): facial flushing (redness) headache nausea, vomiting This list may not describe all possible side effects. Call your doctor for medical advice about side effects. You may report side effects to FDA at 1-800-FDA-1088. Where should I keep my medicine? Keep out of the reach of children. Store at room temperature between 20 and 25 degrees C (68 and 77 degrees F). Store in Retail buyer. Protect from light and moisture. Keep tightly closed. Throw away any unused medicine after the expiration date. NOTE: This sheet is a summary. It may not cover all possible information. If you have questions about this medicine, talk to your doctor, pharmacist, or health care provider.  2021 Elsevier/Gold Standard (2017-12-01 16:46:32)

## 2023-04-30 NOTE — H&P (View-Only) (Signed)
 Cardiology Office Note:    Date:  04/30/2023   ID:  Katelyn Porter, DOB 1950/07/01, MRN [id number]  PCP:  Leola Brazil, DO  Cardiologist:  Garwin Brothers, MD   Referring MD: Leola Brazil, DO    ASSESSMENT:    1. Dyspnea on exertion   2. Essential (primary) hypertension   3. HFrEF (heart failure with reduced ejection fraction) (HCC)   4. Type 2 diabetes mellitus without complication, without long-term current use of insulin (HCC)   5. Severe obesity (BMI 35.0-35.9 with comorbidity) (HCC)    PLAN:    In order of problems listed above:  Primary prevention stressed with the patient.  Importance of compliance with diet medication stressed and patient verbalized standing. Dyspnea on exertion: Abnormal nuclear stress test: Cardiomyopathy: Patient's symptoms are very concerning.  She has multiple risk factors for coronary artery disease.  Following recommendations were made to the patient.  I discussed CT coronary angiography and conventional invasive coronary angiography and she prefers the latter.I discussed coronary angiography and left heart catheterization with the patient at extensive length. Procedure, benefits and potential risks were explained. Patient had multiple questions which were answered to the patient's satisfaction. Patient agreed and consented for the procedure. Further recommendations will be made based on the findings of the coronary angiography. In the interim. The patient has any significant symptoms he knows to go to the nearest emergency room. In the interim she has been advised to take a coated baby aspirin on a daily basis.  Sublingual nitroglycerin prescription was sent, its protocol and 911 protocol explained and the patient vocalized understanding questions were answered to the patient's satisfaction Essential hypertension: Blood pressure stable and diet was emphasized. Obesity: Weight reduction stressed and she promises to do better. Further  recommendations will be made based on the findings of the coronary angiography.  Patient had multiple questions which were answered to her satisfaction.   Medication Adjustments/Labs and Tests Ordered: Current medicines are reviewed at length with the patient today.  Concerns regarding medicines are outlined above.  Orders Placed This Encounter  Procedures   EKG 12-Lead   No orders of the defined types were placed in this encounter.    No chief complaint on file.    History of Present Illness:    Katelyn Porter is a 73 y.o. female.  Patient has past medical history of essential hypertension, mixed dyslipidemia, history of reduced ejection fraction.  She gives history of significant dyspnea on exertion.  Stress test and echocardiogram have both been abnormal.  Stress test reveals evidence of ischemia.  Patient is here for evaluation.  She mentions to me that her dyspnea on exertion is getting progressively worse.  She is concerned about it.  At the time of my evaluation, the patient is alert awake oriented and in no distress.  No chest pain  Past Medical History:  Diagnosis Date   Acute systolic congestive heart failure (HCC) 03/17/2022   Aortic atherosclerosis (HCC) 03/03/2023   At risk for sleep apnea 12/19/2021   Bilateral carpal tunnel syndrome 10/27/2019   Breast cancer (HCC) 2008   left, with radiation finished 08, tamoxifen   Breast cancer (HCC) 2019   Rt. 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   Last Assessment & Plan:   Stable off meds for last month. Will monitor.  Last Assessment & Plan:   Stable on current meds, continue same.   Last Assessment & Plan:   Stable off meds for last month. Will monitor.     Family history of breast cancer    Family history of colon cancer    Family history of ovarian cancer 07/21/2017   Gastro-esophageal reflux disease without esophagitis 01/24/2013   Overview:   Last Assessment & Plan:   Improved on prilosec.   Last Assessment & Plan:   Improved on prilosec.   Last Assessment & Plan:   Improved on prilosec.   Last Assessment & Plan:   Improved on prilosec.     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  The Hereditary Gene Panel offered by [facility] includes sequencing and/or deletion duplication testing of the following 47 genes: APC, ATM, AXIN2, BARD1, BMPR1A, BRCA1, BRCA2, BRIP1, CDH1, CDK4, CDKN2A (p14ARF), CDKN2A (p16INK4a),    Hallux valgus (acquired), right foot 08/11/2016   Hand numbness 10/27/2019   HFrEF (heart failure with reduced ejection fraction) (HCC) 03/17/2022   Hot flashes related to aromatase inhibitor therapy 04/21/2018   Hyperlipidemia    Hypertension    IBS (irritable bowel syndrome)    IFG (impaired fasting glucose) 06/18/2021   Impacted cerumen of right ear 02/27/2019   Irritable larynx 06/12/2021   Loss of consciousness (HCC) 10/10/2021   Major depressive disorder, single episode 01/24/2013   Last Assessment & Plan:   Stable on zoloft, continue same.   Last Assessment & Plan:   Stable on zoloft, continue same.     Malignant neoplasm of  upper-outer quadrant of right breast in female, estrogen receptor positive (HCC) 06/29/2017   Monoallelic mutation of ATM gene 16/12/9602   NSVT (nonsustained ventricular tachycardia) (HCC) 03/11/2021   Other sites of candidiasis 05/25/2022   Other spondylosis with radiculopathy, lumbar region 06/26/2020   Personal history of radiation therapy 2007   left breast   Personal history of radiation therapy 2019   right breast   Recurrent major depressive disorder, in partial remission (HCC) 01/24/2013   Last Assessment & Plan:   Stable on zoloft, continue same.     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   Tinnitus 08/07/2019   Trigeminal neuralgia of right side of face 01/13/2023   Right ear     Type 2 diabetes mellitus without complication, without long-term current use of insulin (HCC) 05/07/2022   Ulnar nerve entrapment at elbow, left 10/27/2019   Vertigo    Vitamin D deficiency 08/12/2017    Past Surgical History:  Procedure Laterality Date   ABDOMINAL HYSTERECTOMY  1985   TAH (ovaries remain)   BREAST LUMPECTOMY Left 2007  BREAST LUMPECTOMY Right 2019   BREAST LUMPECTOMY WITH RADIOACTIVE SEED AND SENTINEL LYMPH NODE BIOPSY Right 07/22/2017   Procedure: BREAST LUMPECTOMY WITH RADIOACTIVE SEED AND SENTINEL LYMPH NODE BIOPSY;  Surgeon: Harriette Bouillon, MD;  Location: [facility];  Service: General;  Laterality: Right;   COLONOSCOPY     TONSILLECTOMY     age 28    Current Medications: Current Meds  Medication Sig   anastrozole (ARIMIDEX) 1 MG tablet Take 1 tablet (1 mg total) by mouth daily.   Cholecalciferol (VITAMIN D3) 1000 units CAPS Take 1 capsule by mouth daily.   DULoxetine (CYMBALTA) 60 MG capsule Take 60 mg by mouth daily.    fluticasone (FLONASE) 50 MCG/ACT nasal spray Place 1 spray  into both nostrils 2 (two) times daily.   Multiple Vitamins-Minerals (ONE-A-DAY WOMENS 50 PLUS PO) Take 1 tablet by mouth daily.   pantoprazole (PROTONIX) 40 MG tablet Take 40 mg by mouth daily.   rosuvastatin (CRESTOR) 10 MG tablet Take 1 tablet (10 mg total) by mouth daily.   WELLBUTRIN SR 150 MG 12 hr tablet Take 150 mg by mouth daily.     Allergies:   Crab extract, Crab [shellfish allergy], Sulfa antibiotics, Lipitor [atorvastatin], Oxcarbazepine, and Sulfa drugs cross reactors   Social History   Socioeconomic History   Marital status: Widowed    Spouse name: Not on file   Number of children: 0   Years of education: Not on file   Highest education level: Not on file  Occupational History   Occupation: accounts payable    Employer: [employer]  Tobacco Use   Smoking status: Never   Smokeless tobacco: Never  Vaping Use   Vaping status: Never Used  Substance and Sexual Activity   Alcohol use: No   Drug use: No   Sexual activity: Not Currently    Birth control/protection: Surgical    Comment: hysterectomy  Other Topics Concern   Not on file  Social History Narrative   No children   Married   Enjoys quilting and horseback riding.    She works in [employer].   She completed 2 years of college.    Social Drivers of Health   Financial Resource Strain: Medium Risk (06/11/2021)   [facility] prior to [facility]16/[facility]22[facility]   Overall Financial Resource Strain (CARDIA)    Difficulty of Paying Living Expenses: Somewhat hard  Food Insecurity: Low Risk  (09/27/2022)   [facility]   Hunger Vital Sign    Worried About Running Out of Food in the Last Year: Never true    Ran Out of Food in the Last Year: Never true  Transportation Needs: Not on file (09/27/2022)  Physical Activity: Inactive (06/11/2021)   [facility] to [facility] visits prior to 05/16/2022., [facility]   Exercise Vital Sign    Days of Exercise per Week: 0 days    Minutes of Exercise per Session: 0 min  Stress: Stress Concern Present (06/11/2021)   [facility] to [facility]16/[facility]22[facility], [facility] visits prior to 05/16/2022[facility]   [employer] of Occupational Health - Occupational Stress Questionnaire    Feeling of Stress : To some extent  Social Connections: Moderately Isolated (06/11/2021)  [facility] to [facility]16/2022[facility], [facility] prior to 05/16/2022., [facility]   Social Connection and Isolation Panel [NHANES]    Frequency of Communication with Friends and Family: More than three times a week    Frequency of Social Gatherings with Friends and Family: Never    Attends Religious Services: More than 4 times per year    Active Member of [employer] or Organizations: No    Attends Banker Meetings: Never    Marital Status: Widowed     Family History: The patient's family history includes Allergic rhinitis in her sister; Asthma in her brother; Breast cancer in her cousin and maternal aunt; Colon cancer in her cousin; Colon cancer (age of onset: 48) in her mother; Colon polyps in her sister; Coronary artery disease (age of onset: 59) in her brother; Diabetes in her brother, mother, and sister; Heart disease in her father; Hypertension in her mother and sister; Kidney disease in her mother; Lung cancer in her father; Ovarian cancer in her paternal grandmother. There is no history of Esophageal cancer, Stomach cancer, Rectal cancer, Pancreatic cancer, or Liver disease.  ROS:   Please see the history of present illness.    All other systems reviewed and are  negative.  EKGs/Labs/Other Studies Reviewed:    The following studies were reviewed today: .Marland KitchenEKG Interpretation Date/Time:  Friday April 30 2023 09:15:38 EST Ventricular Rate:  77 PR Interval:  140 QRS Duration:  84 QT Interval:  382 QTC Calculation: 432 R Axis:   11  Text Interpretation: Normal sinus rhythm Normal ECG When compared with ECG of 03-Mar-2023 10:44, No significant change was found Confirmed by Belva Crome [phone]) on 04/30/2023 9:30:16 AM     Recent Labs: 01/26/2023: Hemoglobin 13.9; Platelet Count 287 03/03/2023: ALT 18; BUN 15; Creatinine, Ser 0.94; Potassium 4.5; Sodium 144  Recent Lipid Panel    Component Value Date/Time   CHOL 216 (H) 06/09/2016 0854   TRIG 206 (H) 06/09/2016 0854   HDL 36 (L) 06/09/2016 0854   CHOLHDL 6.0 (H) 06/09/2016 0854   VLDL 41 (H) 06/09/2016 0854   LDLCALC 139 (H) 06/09/2016 0854    Physical Exam:    VS:  BP 120/64   Pulse 77   Ht 5\' 4"  (1.626 m)   Wt 215 lb 0.6 oz (97.5 kg)   LMP 03/17/1983   SpO2 95%   BMI 36.91 kg/m     Wt Readings from Last 3 Encounters:  04/30/23 215 lb 0.6 oz (97.5 kg)  04/12/23 217 lb (98.4 kg)  03/03/23 217 lb (98.4 kg)     GEN: Patient is in no acute distress HEENT: Normal NECK: No JVD; No carotid bruits LYMPHATICS: No lymphadenopathy CARDIAC: Hear sounds regular, 2/6 systolic murmur at the apex. RESPIRATORY:  Clear to auscultation without rales, wheezing or rhonchi  ABDOMEN: Soft, non-tender, non-distended MUSCULOSKELETAL:  No edema; No deformity  SKIN: Warm and dry NEUROLOGIC:  Alert and oriented x 3 PSYCHIATRIC:  Normal affect   Signed, Garwin Brothers, MD  04/30/2023 9:43 AM    [facility]

## 2023-04-30 NOTE — Progress Notes (Signed)
Cardiology Office Note:    Date:  04/30/2023   ID:  Katelyn Porter, DOB 1950/07/01, MRN [id number]  PCP:  Leola Brazil, DO  Cardiologist:  Garwin Brothers, MD   Referring MD: Leola Brazil, DO    ASSESSMENT:    1. Dyspnea on exertion   2. Essential (primary) hypertension   3. HFrEF (heart failure with reduced ejection fraction) (HCC)   4. Type 2 diabetes mellitus without complication, without long-term current use of insulin (HCC)   5. Severe obesity (BMI 35.0-35.9 with comorbidity) (HCC)    PLAN:    In order of problems listed above:  Primary prevention stressed with the patient.  Importance of compliance with diet medication stressed and patient verbalized standing. Dyspnea on exertion: Abnormal nuclear stress test: Cardiomyopathy: Patient's symptoms are very concerning.  She has multiple risk factors for coronary artery disease.  Following recommendations were made to the patient.  I discussed CT coronary angiography and conventional invasive coronary angiography and she prefers the latter.I discussed coronary angiography and left heart catheterization with the patient at extensive length. Procedure, benefits and potential risks were explained. Patient had multiple questions which were answered to the patient's satisfaction. Patient agreed and consented for the procedure. Further recommendations will be made based on the findings of the coronary angiography. In the interim. The patient has any significant symptoms he knows to go to the nearest emergency room. In the interim she has been advised to take a coated baby aspirin on a daily basis.  Sublingual nitroglycerin prescription was sent, its protocol and 911 protocol explained and the patient vocalized understanding questions were answered to the patient's satisfaction Essential hypertension: Blood pressure stable and diet was emphasized. Obesity: Weight reduction stressed and she promises to do better. Further  recommendations will be made based on the findings of the coronary angiography.  Patient had multiple questions which were answered to her satisfaction.   Medication Adjustments/Labs and Tests Ordered: Current medicines are reviewed at length with the patient today.  Concerns regarding medicines are outlined above.  Orders Placed This Encounter  Procedures   EKG 12-Lead   No orders of the defined types were placed in this encounter.    No chief complaint on file.    History of Present Illness:    Katelyn Porter is a 73 y.o. female.  Patient has past medical history of essential hypertension, mixed dyslipidemia, history of reduced ejection fraction.  She gives history of significant dyspnea on exertion.  Stress test and echocardiogram have both been abnormal.  Stress test reveals evidence of ischemia.  Patient is here for evaluation.  She mentions to me that her dyspnea on exertion is getting progressively worse.  She is concerned about it.  At the time of my evaluation, the patient is alert awake oriented and in no distress.  No chest pain  Past Medical History:  Diagnosis Date   Acute systolic congestive heart failure (HCC) 03/17/2022   Aortic atherosclerosis (HCC) 03/03/2023   At risk for sleep apnea 12/19/2021   Bilateral carpal tunnel syndrome 10/27/2019   Breast cancer (HCC) 2008   left, with radiation finished 08, tamoxifen   Breast cancer (HCC) 2019   Rt. 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   Last Assessment & Plan:   Stable off meds for last month. Will monitor.  Last Assessment & Plan:   Stable on current meds, continue same.   Last Assessment & Plan:   Stable off meds for last month. Will monitor.     Family history of breast cancer    Family history of colon cancer    Family history of ovarian cancer 07/21/2017   Gastro-esophageal reflux disease without esophagitis 01/24/2013   Overview:   Last Assessment & Plan:   Improved on prilosec.   Last Assessment & Plan:   Improved on prilosec.   Last Assessment & Plan:   Improved on prilosec.   Last Assessment & Plan:   Improved on prilosec.     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  The Hereditary Gene Panel offered by [facility] includes sequencing and/or deletion duplication testing of the following 47 genes: APC, ATM, AXIN2, BARD1, BMPR1A, BRCA1, BRCA2, BRIP1, CDH1, CDK4, CDKN2A (p14ARF), CDKN2A (p16INK4a),    Hallux valgus (acquired), right foot 08/11/2016   Hand numbness 10/27/2019   HFrEF (heart failure with reduced ejection fraction) (HCC) 03/17/2022   Hot flashes related to aromatase inhibitor therapy 04/21/2018   Hyperlipidemia    Hypertension    IBS (irritable bowel syndrome)    IFG (impaired fasting glucose) 06/18/2021   Impacted cerumen of right ear 02/27/2019   Irritable larynx 06/12/2021   Loss of consciousness (HCC) 10/10/2021   Major depressive disorder, single episode 01/24/2013   Last Assessment & Plan:   Stable on zoloft, continue same.   Last Assessment & Plan:   Stable on zoloft, continue same.     Malignant neoplasm of  upper-outer quadrant of right breast in female, estrogen receptor positive (HCC) 06/29/2017   Monoallelic mutation of ATM gene 16/12/9602   NSVT (nonsustained ventricular tachycardia) (HCC) 03/11/2021   Other sites of candidiasis 05/25/2022   Other spondylosis with radiculopathy, lumbar region 06/26/2020   Personal history of radiation therapy 2007   left breast   Personal history of radiation therapy 2019   right breast   Recurrent major depressive disorder, in partial remission (HCC) 01/24/2013   Last Assessment & Plan:   Stable on zoloft, continue same.     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   Tinnitus 08/07/2019   Trigeminal neuralgia of right side of face 01/13/2023   Right ear     Type 2 diabetes mellitus without complication, without long-term current use of insulin (HCC) 05/07/2022   Ulnar nerve entrapment at elbow, left 10/27/2019   Vertigo    Vitamin D deficiency 08/12/2017    Past Surgical History:  Procedure Laterality Date   ABDOMINAL HYSTERECTOMY  1985   TAH (ovaries remain)   BREAST LUMPECTOMY Left 2007  BREAST LUMPECTOMY Right 2019   BREAST LUMPECTOMY WITH RADIOACTIVE SEED AND SENTINEL LYMPH NODE BIOPSY Right 07/22/2017   Procedure: BREAST LUMPECTOMY WITH RADIOACTIVE SEED AND SENTINEL LYMPH NODE BIOPSY;  Surgeon: Harriette Bouillon, MD;  Location: [facility];  Service: General;  Laterality: Right;   COLONOSCOPY     TONSILLECTOMY     age 28    Current Medications: Current Meds  Medication Sig   anastrozole (ARIMIDEX) 1 MG tablet Take 1 tablet (1 mg total) by mouth daily.   Cholecalciferol (VITAMIN D3) 1000 units CAPS Take 1 capsule by mouth daily.   DULoxetine (CYMBALTA) 60 MG capsule Take 60 mg by mouth daily.    fluticasone (FLONASE) 50 MCG/ACT nasal spray Place 1 spray  into both nostrils 2 (two) times daily.   Multiple Vitamins-Minerals (ONE-A-DAY WOMENS 50 PLUS PO) Take 1 tablet by mouth daily.   pantoprazole (PROTONIX) 40 MG tablet Take 40 mg by mouth daily.   rosuvastatin (CRESTOR) 10 MG tablet Take 1 tablet (10 mg total) by mouth daily.   WELLBUTRIN SR 150 MG 12 hr tablet Take 150 mg by mouth daily.     Allergies:   Crab extract, Crab [shellfish allergy], Sulfa antibiotics, Lipitor [atorvastatin], Oxcarbazepine, and Sulfa drugs cross reactors   Social History   Socioeconomic History   Marital status: Widowed    Spouse name: Not on file   Number of children: 0   Years of education: Not on file   Highest education level: Not on file  Occupational History   Occupation: accounts payable    Employer: [employer]  Tobacco Use   Smoking status: Never   Smokeless tobacco: Never  Vaping Use   Vaping status: Never Used  Substance and Sexual Activity   Alcohol use: No   Drug use: No   Sexual activity: Not Currently    Birth control/protection: Surgical    Comment: hysterectomy  Other Topics Concern   Not on file  Social History Narrative   No children   Married   Enjoys quilting and horseback riding.    She works in [employer] fast [employer].   She completed 2 years of college.    Social Drivers of Health   Financial Resource Strain: Medium Risk (06/11/2021)   [facility] prior to [facility]16/[facility]22[facility]   Overall Financial Resource Strain (CARDIA)    Difficulty of Paying Living Expenses: Somewhat hard  Food Insecurity: Low Risk  (09/27/2022)   [facility]   Hunger Vital Sign    Worried About Running Out of Food in the Last Year: Never true    Ran Out of Food in the Last Year: Never true  Transportation Needs: Not on file (09/27/2022)  Physical Activity: Inactive (06/11/2021)   [facility] to [facility] visits prior to 05/16/2022., [facility]   Exercise Vital Sign    Days of Exercise per Week: 0 days    Minutes of Exercise per Session: 0 min  Stress: Stress Concern Present (06/11/2021)   [facility] to [facility]16/[facility]22[facility], [facility] visits prior to 05/16/2022[facility]   [employer] of Occupational Health - Occupational Stress Questionnaire    Feeling of Stress : To some extent  Social Connections: Moderately Isolated (06/11/2021)  [facility] to [facility]16/2022[facility], [facility] prior to 05/16/2022[facility]   Social Connection and Isolation Panel [NHANES]    Frequency of Communication with Friends and Family: More than three times a week    Frequency of Social Gatherings with Friends and Family: Never    Attends Religious Services: More than 4 times per year    Active Member of [employer] or Organizations: No    Attends Banker Meetings: Never    Marital Status: Widowed     Family History: The patient's family history includes Allergic rhinitis in her sister; Asthma in her brother; Breast cancer in her cousin and maternal aunt; Colon cancer in her cousin; Colon cancer (age of onset: 48) in her mother; Colon polyps in her sister; Coronary artery disease (age of onset: 59) in her brother; Diabetes in her brother, mother, and sister; Heart disease in her father; Hypertension in her mother and sister; Kidney disease in her mother; Lung cancer in her father; Ovarian cancer in her paternal grandmother. There is no history of Esophageal cancer, Stomach cancer, Rectal cancer, Pancreatic cancer, or Liver disease.  ROS:   Please see the history of present illness.    All other systems reviewed and are  negative.  EKGs/Labs/Other Studies Reviewed:    The following studies were reviewed today: .Marland KitchenEKG Interpretation Date/Time:  Friday April 30 2023 09:15:38 EST Ventricular Rate:  77 PR Interval:  140 QRS Duration:  84 QT Interval:  382 QTC Calculation: 432 R Axis:   11  Text Interpretation: Normal sinus rhythm Normal ECG When compared with ECG of 03-Mar-2023 10:44, No significant change was found Confirmed by Belva Crome [phone]) on 04/30/2023 9:30:16 AM     Recent Labs: 01/26/2023: Hemoglobin 13.9; Platelet Count 287 03/03/2023: ALT 18; BUN 15; Creatinine, Ser 0.94; Potassium 4.5; Sodium 144  Recent Lipid Panel    Component Value Date/Time   CHOL 216 (H) 06/09/2016 0854   TRIG 206 (H) 06/09/2016 0854   HDL 36 (L) 06/09/2016 0854   CHOLHDL 6.0 (H) 06/09/2016 0854   VLDL 41 (H) 06/09/2016 0854   LDLCALC 139 (H) 06/09/2016 0854    Physical Exam:    VS:  BP 120/64   Pulse 77   Ht 5\' 4"  (1.626 m)   Wt 215 lb 0.6 oz (97.5 kg)   LMP 03/17/1983   SpO2 95%   BMI 36.91 kg/m     Wt Readings from Last 3 Encounters:  04/30/23 215 lb 0.6 oz (97.5 kg)  04/12/23 217 lb (98.4 kg)  03/03/23 217 lb (98.4 kg)     GEN: Patient is in no acute distress HEENT: Normal NECK: No JVD; No carotid bruits LYMPHATICS: No lymphadenopathy CARDIAC: Hear sounds regular, 2/6 systolic murmur at the apex. RESPIRATORY:  Clear to auscultation without rales, wheezing or rhonchi  ABDOMEN: Soft, non-tender, non-distended MUSCULOSKELETAL:  No edema; No deformity  SKIN: Warm and dry NEUROLOGIC:  Alert and oriented x 3 PSYCHIATRIC:  Normal affect   Signed, Garwin Brothers, MD  04/30/2023 9:43 AM    [facility]

## 2023-05-01 LAB — CBC WITH DIFFERENTIAL/PLATELET
Basophils Absolute: 0.1 10*3/uL (ref 0.0–0.2)
Basos: 1 %
EOS (ABSOLUTE): 0.2 10*3/uL (ref 0.0–0.4)
Eos: 3 %
Hematocrit: 43.6 % (ref 34.0–46.6)
Hemoglobin: 14.4 g/dL (ref 11.1–15.9)
Immature Grans (Abs): 0 10*3/uL (ref 0.0–0.1)
Immature Granulocytes: 0 %
Lymphocytes Absolute: 2.6 10*3/uL (ref 0.7–3.1)
Lymphs: 41 %
MCH: 30.2 pg (ref 26.6–33.0)
MCHC: 33 g/dL (ref 31.5–35.7)
MCV: 91 fL (ref 79–97)
Monocytes Absolute: 0.7 10*3/uL (ref 0.1–0.9)
Monocytes: 11 %
Neutrophils Absolute: 2.8 10*3/uL (ref 1.4–7.0)
Neutrophils: 44 %
Platelets: 293 10*3/uL (ref 150–450)
RBC: 4.77 x10E6/uL (ref 3.77–5.28)
RDW: 12.6 % (ref 11.7–15.4)
WBC: 6.4 10*3/uL (ref 3.4–10.8)

## 2023-05-01 LAB — BASIC METABOLIC PANEL
BUN/Creatinine Ratio: 17 (ref 12–28)
BUN: 15 mg/dL (ref 8–27)
CO2: 25 mmol/L (ref 20–29)
Calcium: 9.9 mg/dL (ref 8.7–10.3)
Chloride: 103 mmol/L (ref 96–106)
Creatinine, Ser: 0.89 mg/dL (ref 0.57–1.00)
Glucose: 96 mg/dL (ref 70–99)
Potassium: 4.5 mmol/L (ref 3.5–5.2)
Sodium: 142 mmol/L (ref 134–144)
eGFR: 69 mL/min/{1.73_m2} (ref >59)

## 2023-05-03 ENCOUNTER — Other Ambulatory Visit: Payer: Self-pay

## 2023-05-03 ENCOUNTER — Encounter (HOSPITAL_COMMUNITY): Payer: Self-pay | Admitting: Internal Medicine

## 2023-05-03 ENCOUNTER — Ambulatory Visit (HOSPITAL_COMMUNITY)
Admission: RE | Admit: 2023-05-03 | Discharge: 2023-05-03 | Disposition: A | Discharge status: Home / Self Care | Payer: HMO | Attending: Internal Medicine | Admitting: Internal Medicine

## 2023-05-03 ENCOUNTER — Encounter (HOSPITAL_COMMUNITY)
Admission: RE | Disposition: A | Discharge status: Home / Self Care | Payer: Self-pay | Source: Home / Self Care | Attending: Internal Medicine

## 2023-05-03 DIAGNOSIS — I5020 Unspecified systolic (congestive) heart failure: Secondary | ICD-10-CM | POA: Diagnosis not present

## 2023-05-03 DIAGNOSIS — E119 Type 2 diabetes mellitus without complications: Secondary | ICD-10-CM | POA: Insufficient documentation

## 2023-05-03 DIAGNOSIS — I10 Essential (primary) hypertension: Secondary | ICD-10-CM

## 2023-05-03 DIAGNOSIS — I2510 Atherosclerotic heart disease of native coronary artery without angina pectoris: Secondary | ICD-10-CM

## 2023-05-03 DIAGNOSIS — Z79899 Other long term (current) drug therapy: Secondary | ICD-10-CM | POA: Diagnosis not present

## 2023-05-03 DIAGNOSIS — I428 Other cardiomyopathies: Secondary | ICD-10-CM | POA: Diagnosis not present

## 2023-05-03 DIAGNOSIS — R9439 Abnormal result of other cardiovascular function study: Secondary | ICD-10-CM | POA: Diagnosis present

## 2023-05-03 DIAGNOSIS — I110 Hypertensive heart disease with heart failure: Secondary | ICD-10-CM | POA: Diagnosis not present

## 2023-05-03 DIAGNOSIS — Z6836 Body mass index (BMI) 36.0-36.9, adult: Secondary | ICD-10-CM | POA: Insufficient documentation

## 2023-05-03 DIAGNOSIS — E782 Mixed hyperlipidemia: Secondary | ICD-10-CM | POA: Insufficient documentation

## 2023-05-03 DIAGNOSIS — R0609 Other forms of dyspnea: Secondary | ICD-10-CM

## 2023-05-03 HISTORY — PX: LEFT HEART CATH AND CORONARY ANGIOGRAPHY: CATH118249

## 2023-05-03 SURGERY — LEFT HEART CATH AND CORONARY ANGIOGRAPHY
Anesthesia: LOCAL

## 2023-05-03 MED ORDER — ONDANSETRON HCL 4 MG/2ML IJ SOLN: 4.0000 mg | Freq: Four times a day (QID) | INTRAMUSCULAR | Status: DC | PRN

## 2023-05-03 MED ORDER — HEPARIN SODIUM (PORCINE) 1000 UNIT/ML IJ SOLN
INTRAMUSCULAR | Status: DC | PRN
  Administered 2023-05-03: 5000 [IU] via INTRAVENOUS

## 2023-05-03 MED ORDER — SODIUM CHLORIDE 0.9 % WEIGHT BASED INFUSION: 3.0000 mL/kg/h | INTRAVENOUS | Status: AC

## 2023-05-03 MED ORDER — HEPARIN SODIUM (PORCINE) 1000 UNIT/ML IJ SOLN
INTRAMUSCULAR | Status: AC
Start: 2023-05-03 — End: ?
  Filled 2023-05-03: qty 10

## 2023-05-03 MED ORDER — VERAPAMIL HCL 2.5 MG/ML IV SOLN
INTRAVENOUS | Status: AC
  Filled 2023-05-03: qty 2

## 2023-05-03 MED ORDER — ASPIRIN 81 MG PO CHEW: 81.0000 mg | CHEWABLE_TABLET | ORAL | Status: DC

## 2023-05-03 MED ORDER — SODIUM CHLORIDE 0.9% FLUSH: 3.0000 mL | Freq: Two times a day (BID) | INTRAVENOUS | Status: DC

## 2023-05-03 MED ORDER — HYDRALAZINE HCL 20 MG/ML IJ SOLN: 10.0000 mg | INTRAMUSCULAR | Status: DC | PRN

## 2023-05-03 MED ORDER — MIDAZOLAM HCL 2 MG/2ML IJ SOLN
INTRAMUSCULAR | Status: DC | PRN
  Administered 2023-05-03: 1 mg via INTRAVENOUS

## 2023-05-03 MED ORDER — LABETALOL HCL 5 MG/ML IV SOLN
10.0000 mg | INTRAVENOUS | Status: DC | PRN
  Administered 2023-05-03: 10 mg via INTRAVENOUS
  Filled 2023-05-03: qty 4

## 2023-05-03 MED ORDER — FENTANYL CITRATE (PF) 100 MCG/2ML IJ SOLN
INTRAMUSCULAR | Status: DC | PRN
  Administered 2023-05-03: 25 ug via INTRAVENOUS

## 2023-05-03 MED ORDER — LIDOCAINE HCL (CARDIAC) PF 100 MG/5ML IV SOSY
PREFILLED_SYRINGE | INTRAVENOUS | Status: AC
  Filled 2023-05-03: qty 5

## 2023-05-03 MED ORDER — SODIUM CHLORIDE 0.9 % IV SOLN: 250.0000 mL | INTRAVENOUS | Status: DC | PRN

## 2023-05-03 MED ORDER — SODIUM CHLORIDE 0.9% FLUSH: 3.0000 mL | INTRAVENOUS | Status: DC | PRN

## 2023-05-03 MED ORDER — CARVEDILOL 3.125 MG PO TABS: 3.1250 mg | ORAL_TABLET | Freq: Two times a day (BID) | ORAL | 11 refills | Status: AC

## 2023-05-03 MED ORDER — FENTANYL CITRATE (PF) 100 MCG/2ML IJ SOLN
INTRAMUSCULAR | Status: AC
  Filled 2023-05-03: qty 2

## 2023-05-03 MED ORDER — MIDAZOLAM HCL 2 MG/2ML IJ SOLN
INTRAMUSCULAR | Status: AC
  Filled 2023-05-03: qty 2

## 2023-05-03 MED ORDER — SODIUM CHLORIDE 0.9 % IV SOLN: INTRAVENOUS | Status: DC

## 2023-05-03 MED ORDER — SODIUM CHLORIDE 0.9 % WEIGHT BASED INFUSION: 1.0000 mL/kg/h | INTRAVENOUS | Status: DC

## 2023-05-03 MED ORDER — IOHEXOL 350 MG/ML SOLN
INTRAVENOUS | Status: DC | PRN
  Administered 2023-05-03: 38 mL via INTRA_ARTERIAL

## 2023-05-03 MED ORDER — HEPARIN (PORCINE) IN NACL 1000-0.9 UT/500ML-% IV SOLN
INTRAVENOUS | Status: DC | PRN
  Administered 2023-05-03 (×2): 500 mL

## 2023-05-03 MED ORDER — VERAPAMIL HCL 2.5 MG/ML IV SOLN
INTRAVENOUS | Status: DC | PRN
  Administered 2023-05-03 (×2): 10 mL via INTRA_ARTERIAL

## 2023-05-03 MED ORDER — LIDOCAINE HCL (PF) 1 % IJ SOLN
INTRAMUSCULAR | Status: DC | PRN
  Administered 2023-05-03: 2 mL

## 2023-05-03 MED ORDER — ACETAMINOPHEN 325 MG PO TABS: 650.0000 mg | ORAL_TABLET | ORAL | Status: DC | PRN

## 2023-05-03 SURGICAL SUPPLY — 11 items
CATH INFINITI 5 FR JL3.5 (CATHETERS) IMPLANT
CATH INFINITI 5FR MULTPACK ANG (CATHETERS) IMPLANT
DEVICE RAD COMP TR BAND LRG (VASCULAR PRODUCTS) IMPLANT
ELECT DEFIB PAD ADLT CADENCE (PAD) IMPLANT
GLIDESHEATH SLEND SS 6F .021 (SHEATH) IMPLANT
GUIDEWIRE INQWIRE 1.5J.035X260 (WIRE) IMPLANT
INQWIRE 1.5J .035X260CM (WIRE) ×1
KIT SINGLE USE MANIFOLD (KITS) IMPLANT
KIT SYRINGE INJ CVI SPIKEX1 (MISCELLANEOUS) IMPLANT
PACK CARDIAC CATHETERIZATION (CUSTOM PROCEDURE TRAY) ×1 IMPLANT
SET ATX-X65L (MISCELLANEOUS) IMPLANT

## 2023-05-03 NOTE — Discharge Instructions (Signed)

## 2023-05-03 NOTE — Progress Notes (Signed)
TR band removed at 1005, gauze dressing applied. Right radial level 0, clean, dry, and intact. Patient walked to the bathroom without difficulties.

## 2023-05-03 NOTE — Interval H&P Note (Signed)
History and Physical Interval Note:  05/03/2023 7:09 AM  Katelyn Porter  has presented today for surgery, with the diagnosis of dyspnea on exertion, cardiomyopathy, and abnormal stress test.  The various methods of treatment have been discussed with the patient and family. After consideration of risks, benefits and other options for treatment, the patient has consented to  Procedure(s): LEFT HEART CATH AND CORONARY ANGIOGRAPHY (N/A) as a surgical intervention.  The patient's history has been reviewed, patient examined, no change in status, stable for surgery.  I have reviewed the patient's chart and labs.  Questions were answered to the patient's satisfaction.    Cath Lab Visit (complete for each Cath Lab visit)  Clinical Evaluation Leading to the Procedure:   ACS: No.  Non-ACS:    Anginal/Heart Failure Classification: NYHA class III  Anti-ischemic medical therapy: Minimal Therapy (1 class of medications)  Non-Invasive Test Results: Intermediate-risk stress test findings: cardiac mortality 1-3%/year  Prior CABG: No previous CABG  Chioma Mukherjee

## 2023-05-04 ENCOUNTER — Telehealth: Payer: Self-pay

## 2023-05-04 NOTE — Telephone Encounter (Signed)
 left VM to return call

## 2023-05-04 NOTE — Telephone Encounter (Signed)
-----   Message from Aundra Dubin Revankar sent at 05/03/2023  8:19 PM EST ----- Please bring her in for appointment.  I saw her cath report.  Copy primary Garwin Brothers, MD 05/03/2023 8:19 PM

## 2023-05-05 ENCOUNTER — Encounter: Payer: Self-pay | Admitting: Genetic Counselor

## 2023-05-05 ENCOUNTER — Other Ambulatory Visit: Payer: Medicare HMO

## 2023-05-09 MED FILL — Lidocaine HCl Local Soln Prefilled Syringe 100 MG/5ML (2%): INTRAMUSCULAR | Qty: 5 | Status: AC

## 2023-05-15 ENCOUNTER — Encounter: Payer: Self-pay | Admitting: Cardiology

## 2023-05-19 ENCOUNTER — Other Ambulatory Visit: Payer: Self-pay

## 2023-05-20 ENCOUNTER — Ambulatory Visit: Attending: Cardiology

## 2023-05-20 ENCOUNTER — Ambulatory Visit: Payer: HMO | Attending: Cardiology | Admitting: Cardiology

## 2023-05-20 ENCOUNTER — Encounter: Payer: Self-pay | Admitting: Cardiology

## 2023-05-20 VITALS — BP 128/66 | HR 86 | Ht 64.0 in | Wt 213.0 lb

## 2023-05-20 DIAGNOSIS — I5020 Unspecified systolic (congestive) heart failure: Secondary | ICD-10-CM | POA: Diagnosis not present

## 2023-05-20 DIAGNOSIS — I700 Atherosclerosis of aorta: Secondary | ICD-10-CM

## 2023-05-20 DIAGNOSIS — I10 Essential (primary) hypertension: Secondary | ICD-10-CM

## 2023-05-20 MED ORDER — TRIAMTERENE-HCTZ 37.5-25 MG PO TABS: 0.5000 | ORAL_TABLET | Freq: Every day | ORAL | 2 refills | Status: DC

## 2023-05-20 MED ORDER — SACUBITRIL-VALSARTAN 24-26 MG PO TABS
1.0000 | ORAL_TABLET | Freq: Two times a day (BID) | ORAL | 2 refills | Status: DC
Start: 2023-05-20 — End: 2023-07-28

## 2023-05-20 NOTE — Progress Notes (Signed)
 Cardiology Office Note:    Date:  05/20/2023   ID:  Katelyn Porter, DOB 03/27/1950, MRN [id number]  PCP:  Leola Brazil, DO  Cardiologist:  Garwin Brothers, MD   Referring MD: Leola Brazil, DO    ASSESSMENT:    1. HFrEF (heart failure with reduced ejection fraction) (HCC)   2. Essential (primary) hypertension   3. Aortic atherosclerosis (HCC)    PLAN:    In order of problems listed above:  Nonischemic cardiomyopathy: Reduced ejection fraction HFrEF: I discussed my findings with the patient at length.  Congestive heart failure education was given including diet and salt intake issues.  Weight checks recommended to be done regular basis.  She is agreeable.  I also want to initiate guideline directed medical therapy.  Following recommendations were made to her.  She will stop amlodipine.  She will cut down diuretic combination half dose.  She will initiate Entresto low-dose.  She will keep a track of her pulse blood pressure at home.  She will have baseline blood work today including Chem-7 and magnesium. Palpitations: 2-week monitor will be placed today. Hypertension: Blood pressure stable and diet was emphasized.  Lifestyle modification urged. Obesity: Weight reduction stressed diet emphasized and she promises to do better.  Results of coronary angiography were discussed with her at length.  She will be seen in follow-up appointment in 2 weeks or earlier if she has any concerns.    Medication Adjustments/Labs and Tests Ordered: Current medicines are reviewed at length with the patient today.  Concerns regarding medicines are outlined above.  No orders of the defined types were placed in this encounter.  No orders of the defined types were placed in this encounter.    No chief complaint on file.    History of Present Illness:    Katelyn Porter is a 73 y.o. female.  Patient was evaluated for moderate cardiomyopathy and coronary angiography was unremarkable.  She  has history of essential hypertension, dyslipidemia and obesity.  She gives history of some dyspnea on exertion.  At the time of my evaluation, the patient is alert awake oriented and in no distress.  Past Medical History:  Diagnosis Date   Acute systolic congestive heart failure (HCC) 03/17/2022   Aortic atherosclerosis (HCC) 03/03/2023   At risk for sleep apnea 12/19/2021   Bilateral carpal tunnel syndrome 10/27/2019   Breast cancer (HCC) 2008   left, with radiation finished 08, tamoxifen   Breast cancer (HCC) 2019   Rt. 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   Last Assessment & Plan:   Stable off meds for last month. Will monitor.  Last Assessment & Plan:   Stable on current meds, continue same.   Last Assessment & Plan:   Stable off meds  for last month. Will monitor.     Familial hyperlipidemia 03/03/2023   Family history of breast cancer    Family history of colon cancer    Family history of ovarian cancer 07/21/2017   Gastro-esophageal reflux disease without esophagitis 01/24/2013   Overview:   Last Assessment & Plan:   Improved on prilosec.   Last Assessment & Plan:   Improved on prilosec.   Last Assessment & Plan:   Improved on prilosec.   Last Assessment & Plan:   Improved on prilosec.      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  The Hereditary Gene Panel offered by [facility] includes sequencing and/or deletion duplication testing of the following 47 genes: APC, ATM, AXIN2, BARD1, BMPR1A, BRCA1, BRCA2, BRIP1, CDH1, CDK4, CDKN2A (p14ARF), CDKN2A (p16INK4a),    Hallux valgus (acquired), right foot 08/11/2016   Hand numbness 10/27/2019   HFrEF (heart failure with reduced ejection fraction) (HCC) 03/17/2022   Hot flashes related to aromatase inhibitor therapy 04/21/2018   Hyperlipidemia    Hypertension    IBS (irritable bowel syndrome)    IFG (impaired fasting glucose) 06/18/2021   Impacted cerumen of right ear 02/27/2019   Irritable larynx 06/12/2021   Loss of consciousness (HCC) 10/10/2021   Major depressive disorder, single episode 01/24/2013   Last Assessment & Plan:   Stable on zoloft, continue same.   Last Assessment & Plan:   Stable on zoloft, continue same.     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   Tinnitus 08/07/2019   Trigeminal neuralgia of right side of face 01/13/2023   Right ear     Type 2 diabetes mellitus without complication, without long-term current use of insulin (HCC) 05/07/2022   Ulnar nerve entrapment at elbow, left 10/27/2019   Vertigo    Vitamin D deficiency 08/12/2017    Past Surgical History:  Procedure Laterality Date   ABDOMINAL HYSTERECTOMY  1985   TAH (ovaries remain)   BREAST LUMPECTOMY Left 2007   BREAST LUMPECTOMY Right 2019   BREAST LUMPECTOMY WITH RADIOACTIVE SEED AND SENTINEL LYMPH NODE BIOPSY Right 07/22/2017   Procedure: BREAST LUMPECTOMY WITH RADIOACTIVE SEED AND SENTINEL LYMPH NODE BIOPSY;  Surgeon: Harriette Bouillon, MD;  Location: [facility];  Service: General;  Laterality: Right;   COLONOSCOPY     LEFT HEART CATH AND CORONARY ANGIOGRAPHY N/A 05/03/2023   Procedure: LEFT HEART CATH AND CORONARY ANGIOGRAPHY;  Surgeon: Yvonne Kendall, MD;  Location: [facility];  Service: Cardiovascular;  Laterality: N/A;   TONSILLECTOMY  age 43    Current Medications: Current Meds  Medication Sig   amLODipine (NORVASC) 5 MG tablet Take 5 mg by mouth daily as needed (blood pressure of 170).   anastrozole (ARIMIDEX) 1 MG tablet Take 1 tablet (1 mg total) by mouth daily.   aspirin EC 81 MG tablet Take 1 tablet (81 mg total) by mouth daily. Swallow whole.   calcium carbonate (TUMS - DOSED IN MG ELEMENTAL CALCIUM) 500 MG chewable tablet Chew 1 tablet by mouth daily.   carvedilol (COREG) 3.125 MG tablet Take 1 tablet (3.125 mg total) by mouth 2 (two) times daily.   chlorpheniramine (CHLOR-TRIMETON) 4 MG tablet Take 4 mg by mouth 2 (two) times daily as needed for allergies.   Cholecalciferol (VITAMIN D3) 1000 units CAPS Take 1,000 Units by mouth daily.   DULoxetine (CYMBALTA) 60 MG capsule Take 60 mg by mouth daily.    famotidine (PEPCID) 40 MG tablet Take 40 mg by  mouth daily.   fluticasone (FLONASE) 50 MCG/ACT nasal spray Place 1 spray into both nostrils 2 (two) times daily.   gabapentin (NEURONTIN) 300 MG capsule Take 300 mg by mouth at bedtime.   Iron-Vitamins (GERITOL PO) Take 1 tablet by mouth daily.   Magnesium 250 MG TABS Take 250 mg by mouth 2 (two) times a week.   nitroGLYCERIN (NITROSTAT) 0.4 MG SL tablet Place 0.4 mg under the tongue every 5 (five) minutes as needed for chest pain.   pantoprazole (PROTONIX) 40 MG tablet Take 40 mg by mouth 2 (two) times daily.   Phenylephrine-APAP-guaiFENesin (TYLENOL SINUS SEVERE PO) Take 2 tablets by mouth daily.   rosuvastatin (CRESTOR) 10 MG tablet Take 1 tablet (10 mg total) by mouth daily.   Semaglutide (OZEMPIC, 0.25 OR 0.5 MG/DOSE, [place]) Inject 0.5 mg into the skin once a week.   triamterene-hydrochlorothiazide (MAXZIDE-25) 37.5-25 MG tablet Take 1 tablet by mouth daily.   WELLBUTRIN SR 150 MG 12 hr tablet Take 150 mg by mouth daily as needed (depression).     Allergies:   Crab extract, Crab [shellfish allergy], Sulfa antibiotics, Lipitor [atorvastatin], Oxcarbazepine, and Sulfa drugs cross reactors   Social History   Socioeconomic History   Marital status: Widowed    Spouse name: Not on file   Number of children: 0   Years of education: Not on file   Highest education level: Not on file  Occupational History   Occupation: accounts payable    Employer: [employer]  Tobacco Use   Smoking status: Never   Smokeless tobacco: Never  Vaping Use   Vaping status: Never Used  Substance and Sexual Activity   Alcohol use: No   Drug use: No   Sexual activity: Not Currently    Birth control/protection: Surgical    Comment: hysterectomy  Other Topics Concern   Not on file  Social History Narrative   No children   Married   Enjoys quilting and horseback riding.    She works in Civil Service fast streamer.   She completed 2 years of college.    Social Drivers of Health   Financial Resource  Strain: Medium Risk (06/11/2021)   [facility] prior to [facility]16/[facility]22[facility]   Overall Financial Resource Strain (CARDIA)    Difficulty of Paying Living Expenses: Somewhat hard  Food Insecurity: Low Risk  (09/27/2022)   Received from [facility]   Hunger Vital Sign  Worried About Programme researcher, broadcasting/film/video in the Last Year: Never true    Ran Out of Food in the Last Year: Never true  Transportation Needs: Not on file (09/27/2022)  Physical Activity: Inactive (06/11/2021)   [facility] to [facility]16[facility], [facility] visits prior to 05/16/2022., [facility]   Exercise Vital Sign    Days of Exercise per Week: 0 days    Minutes of Exercise per Session: 0 min  Stress: Stress Concern Present (06/11/2021)   [facility] to 05/16/2022[facility], [facility] visits prior to 05/16/2022., [facility]   [employer] of Occupational Health - Occupational Stress Questionnaire    Feeling of Stress : To some extent  Social Connections: Moderately Isolated (06/11/2021)   [facility] prior to 05/16/2022[facility], [facility] visits prior to 05/16/2022., [facility]   Social Connection and Isolation Panel [NHANES]    Frequency of Communication with Friends and Family: More than three times a week    Frequency of Social Gatherings with Friends and Family: Never    Attends Religious Services: More than 4 times per year    Active Member of [employer] or Organizations: No    Attends Banker Meetings: Never    Marital Status: Widowed     Family History: The patient's family history includes Allergic rhinitis in her sister; Asthma in her brother; Breast cancer in  her cousin and maternal aunt; Colon cancer in her cousin; Colon cancer (age of onset: 40) in her mother; Colon polyps in her sister; Coronary artery disease (age of onset: 21) in her brother; Diabetes in her brother, mother, and sister; Heart disease in her father; Hypertension in her mother and sister; Kidney disease in her mother; Lung cancer in her father; Ovarian cancer in her paternal grandmother. There is no history of Esophageal cancer, Stomach cancer, Rectal cancer, Pancreatic cancer, or Liver disease.  ROS:   Please see the history of present illness.    All other systems reviewed and are negative.  EKGs/Labs/Other Studies Reviewed:    The following studies were reviewed today: Coronary angiography report was discussed with the patient at length.   Recent Labs: 03/03/2023: ALT 18 04/30/2023: BUN 15; Creatinine, Ser 0.89; Hemoglobin 14.4; Platelets 293; Potassium 4.5; Sodium 142  Recent Lipid Panel    Component Value Date/Time   CHOL 216 (H) 06/09/2016 0854   TRIG 206 (H) 06/09/2016 0854   HDL 36 (L) 06/09/2016 0854   CHOLHDL 6.0 (H) 06/09/2016 0854   VLDL 41 (H) 06/09/2016 0854   LDLCALC 139 (H) 06/09/2016 0854    Physical Exam:    VS:  BP 128/66   Pulse 86   Ht 5\' 4"  (1.626 m)   Wt 213 lb (96.6 kg)   LMP 03/17/1983   SpO2 96%   BMI 36.56 kg/m     Wt Readings from Last 3 Encounters:  05/20/23 213 lb (96.6 kg)  05/03/23 214 lb (97.1 kg)  04/30/23 215 lb 0.6 oz (97.5 kg)     GEN: Patient is in no acute distress HEENT: Normal NECK: No JVD; No carotid bruits LYMPHATICS: No lymphadenopathy CARDIAC: Hear sounds regular, 2/6 systolic murmur at the apex. RESPIRATORY:  Clear to auscultation without rales, wheezing or rhonchi  ABDOMEN: Soft, non-tender, non-distended MUSCULOSKELETAL:  No edema; No deformity  SKIN: Warm  and dry NEUROLOGIC:  Alert and oriented x 3 PSYCHIATRIC:  Normal affect   Signed, Garwin Brothers, MD  05/20/2023 10:30 AM    [facility]

## 2023-05-20 NOTE — Patient Instructions (Addendum)
 Please keep a BP log for 2 weeks and send by MyChart or mail.                         Name and DOB Katelyn Porter [birth year] Dr. Tomie China [address] [place], [place] [address]  Blood Pressure Record Sheet To take your blood pressure, you will need a blood pressure machine. You can buy a blood pressure machine (blood pressure monitor) at your clinic, drug store, or online. When choosing one, consider: An automatic monitor that has an arm cuff. A cuff that wraps snugly around your upper arm. You should be able to fit only one finger between your arm and the cuff. A device that stores blood pressure reading results. Do not choose a monitor that measures your blood pressure from your wrist or finger. Follow your health care provider's instructions for how to take your blood pressure. To use this form: Get one reading in the morning (a.m.) 1-2 hours after you take any medicines. Get one reading in the evening (p.m.) before supper.   Blood pressure log Date: _______________________  a.m. _____________________(1st reading) HR___________            p.m. _____________________(2nd reading) HR__________  Date: _______________________  a.m. _____________________(1st reading) HR___________            p.m. _____________________(2nd reading) HR__________  Date: _______________________  a.m. _____________________(1st reading) HR___________            p.m. _____________________(2nd reading) HR__________  Date: _______________________  a.m. _____________________(1st reading) HR___________            p.m. _____________________(2nd reading) HR__________  Date: _______________________  a.m. _____________________(1st reading) HR___________            p.m. _____________________(2nd reading) HR__________  Date: _______________________  a.m. _____________________(1st reading) HR___________            p.m. _____________________(2nd reading) HR__________  Date: _______________________  a.m.  _____________________(1st reading) HR___________            p.m. _____________________(2nd reading) HR__________   This information is not intended to replace advice given to you by your health care provider. Make sure you discuss any questions you have with your health care provider. Document Revised: 06/21/2019 Document Reviewed: 06/21/2019 Elsevier Patient Education  2021 Elsevier Inc.  Medication Instructions:    Stop Amlodipine.   Take 1/2 tablet of Maxzide 37.5-25mg . You should only be taking half a tablet once a day.   Start Entresto 24-26mg . Take 1 tablet twice a day.   *If you need a refill on your cardiac medications before your next appointment, please call your pharmacy*   Lab Work: Your physician recommends that you have labs done in the office today. Your test included  basic metabolic panel, and magnesium.   If you have labs (blood work) drawn today and your tests are completely normal, you will receive your results only by: MyChart Message (if you have MyChart) OR A paper copy in the mail If you have any lab test that is abnormal or we need to change your treatment, we will call you to review the results.   Testing/Procedures: A zio monitor was ordered today. It will remain on for 14 days. Remove 06/03/2023. You will then return monitor and event diary in provided box. It takes 1-2 weeks for report to be downloaded and returned to [place]. We will call you with the results. If monitor falls off or has orange flashing light, please call [employer] for  further instructions.     Follow-Up: At [facility], you and your health needs are our priority.  As part of our continuing mission to provide you with exceptional heart care, we have created designated Provider Care Teams.  These Care Teams include your primary Cardiologist (physician) and Advanced Practice Providers (APPs -  Physician Assistants and Nurse Practitioners) who all work together to provide you with the care you  need, when you need it.  We recommend signing up for the patient portal called "MyChart".  Sign up information is provided on this After Visit Summary.  MyChart is used to connect with patients for Virtual Visits (Telemedicine).  Patients are able to view lab/test results, encounter notes, upcoming appointments, etc.  Non-urgent messages can be sent to your provider as well.   To learn more about what you can do with MyChart, go to [email or web address].    Your next appointment:   2 week follow up   Other Instructions NA

## 2023-05-21 LAB — BASIC METABOLIC PANEL
BUN/Creatinine Ratio: 17 (ref 12–28)
BUN: 18 mg/dL (ref 8–27)
CO2: 22 mmol/L (ref 20–29)
Calcium: 9.7 mg/dL (ref 8.7–10.3)
Chloride: 104 mmol/L (ref 96–106)
Creatinine, Ser: 1.07 mg/dL — ABNORMAL HIGH (ref 0.57–1.00)
Glucose: 97 mg/dL (ref 70–99)
Potassium: 4.2 mmol/L (ref 3.5–5.2)
Sodium: 141 mmol/L (ref 134–144)
eGFR: 56 mL/min/{1.73_m2} — ABNORMAL LOW (ref >59)

## 2023-05-21 LAB — PRO B NATRIURETIC PEPTIDE: NT-Pro BNP: 183 pg/mL (ref 0–301)

## 2023-05-21 LAB — MAGNESIUM: Magnesium: 2.1 mg/dL (ref 1.6–2.3)

## 2023-06-10 ENCOUNTER — Ambulatory Visit: Attending: Cardiology | Admitting: Cardiology

## 2023-06-10 ENCOUNTER — Encounter: Payer: Self-pay | Admitting: Cardiology

## 2023-06-10 VITALS — BP 128/56 | HR 68 | Ht 64.0 in | Wt 221.1 lb

## 2023-06-10 DIAGNOSIS — I5020 Unspecified systolic (congestive) heart failure: Secondary | ICD-10-CM | POA: Diagnosis not present

## 2023-06-10 DIAGNOSIS — R002 Palpitations: Secondary | ICD-10-CM | POA: Diagnosis not present

## 2023-06-10 DIAGNOSIS — I4729 Other ventricular tachycardia: Secondary | ICD-10-CM | POA: Diagnosis not present

## 2023-06-10 DIAGNOSIS — E669 Obesity, unspecified: Secondary | ICD-10-CM

## 2023-06-10 DIAGNOSIS — I700 Atherosclerosis of aorta: Secondary | ICD-10-CM | POA: Diagnosis not present

## 2023-06-10 DIAGNOSIS — E782 Mixed hyperlipidemia: Secondary | ICD-10-CM

## 2023-06-10 NOTE — Progress Notes (Signed)
 Cardiology Office Note:    Date:  06/10/2023   ID:  Katelyn Porter, Katelyn Porter Mar 06, 1951, MRN [id number]  PCP:  Katelyn Brazil, DO  Cardiologist:  Katelyn Brothers, MD   Referring MD: Katelyn Brazil, DO    ASSESSMENT:    1. Aortic atherosclerosis (HCC)   2. NSVT (nonsustained ventricular tachycardia) (HCC)   3. Obesity (BMI 35.0-39.9 without comorbidity)   4. HFrEF (heart failure with reduced ejection fraction) (HCC)   5. Mixed hyperlipidemia    PLAN:    In order of problems listed above:  Primary prevention stressed with the patient.  Importance of compliance with diet medication stressed and patient verbalized standing. Cardiomyopathy: On guideline directed medical therapy.  I told him exercise discussed.  She was advised to walk at least half an distance.  She tells me blood work done by primary care recently was within normal limits. Atherosclerotic vascular disease: Stable and asymptomatic. Morbid obesity: Weight reduction stressed diet emphasized.  She is on weight loss medication prescribed by primary care. Mixed dyslipidemia: On lipid-lowering medications followed by primary care.  Goal LDL must be less than 60. Patient will be seen in follow-up appointment in 12 months or earlier if the patient has any conc  Medication Adjustments/Labs and Tests Ordered: Current medicines are reviewed at length with the patient today.  Concerns regarding medicines are outlined above.  No orders of the defined types were placed in this encounter.  No orders of the defined types were placed in this encounter.    No chief complaint on file.    History of Present Illness:    Katelyn Porter is a 73 y.o. female.  Past medical history of cardiomyopathy, essential hypertension, mixed dyslipidemia and obesity.  She denies any problems at this time and takes activities of daily.  No chest pain orthopnea or PND.  At the time of my evaluation, the patient is alert awake oriented and in  no distress.  She leads a sedentary lifestyle.  Past Medical History:  Diagnosis Date   Acute systolic congestive heart failure (HCC) 03/17/2022   Aortic atherosclerosis (HCC) 03/03/2023   At risk for sleep apnea 12/19/2021   Bilateral carpal tunnel syndrome 10/27/2019   Breast cancer (HCC) 2008   left, with radiation finished 08, tamoxifen   Breast cancer (HCC) 2019   Rt. 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   Last Assessment & Plan:   Stable off meds for last month. Will monitor.  Last Assessment & Plan:   Stable on current meds, continue same.   Last Assessment & Plan:   Stable off meds for last month. Will monitor.     Familial hyperlipidemia 03/03/2023   Family history of breast cancer    Family history of colon cancer  Family history of ovarian cancer 07/21/2017   Gastro-esophageal reflux disease without esophagitis 01/24/2013   Overview:   Last Assessment & Plan:   Improved on prilosec.   Last Assessment & Plan:   Improved on prilosec.   Last Assessment & Plan:   Improved on prilosec.   Last Assessment & Plan:   Improved on prilosec.     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  The Hereditary Gene Panel offered by [facility] includes sequencing and/or deletion duplication testing of the following 47 genes: APC, ATM, AXIN2, BARD1, BMPR1A, BRCA1, BRCA2, BRIP1, CDH1, CDK4, CDKN2A (p14ARF), CDKN2A (p16INK4a),    Hallux valgus (acquired), right foot 08/11/2016   Hand numbness 10/27/2019   HFrEF (heart failure with reduced ejection fraction) (HCC) 03/17/2022   Hot flashes related to aromatase inhibitor therapy 04/21/2018   Hyperlipidemia    Hypertension    IBS (irritable bowel syndrome)    IFG (impaired fasting glucose) 06/18/2021   Impacted cerumen of right ear 02/27/2019   Irritable larynx 06/12/2021   Loss of consciousness (HCC) 10/10/2021   Major depressive disorder, single episode 01/24/2013   Last Assessment & Plan:   Stable on zoloft, continue same.   Last Assessment & Plan:   Stable on zoloft, continue same.     Malignant neoplasm of upper-outer quadrant of right breast in female, estrogen receptor positive (HCC) 06/29/2017   Monoallelic mutation of ATM gene 40/98/1191   NSVT (nonsustained ventricular tachycardia) (HCC) 03/11/2021   Obesity (BMI 35.0-39.9 without comorbidity) 03/03/2023   Other sites of candidiasis 05/25/2022   Other spondylosis with radiculopathy, lumbar region 06/26/2020   Personal history of radiation therapy 2007   left breast   Personal history of radiation therapy 2019   right breast   Recurrent major depressive disorder, in partial remission (HCC) 01/24/2013   Last Assessment & Plan:   Stable on zoloft, continue same.     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   Tinnitus 08/07/2019   Trigeminal neuralgia of right side of face 01/13/2023    Right ear     Type 2 diabetes mellitus without complication, without long-term current use of insulin (HCC) 05/07/2022   Ulnar nerve entrapment at elbow, left 10/27/2019   Vertigo    Vitamin D deficiency 08/12/2017    Past Surgical History:  Procedure Laterality Date   ABDOMINAL HYSTERECTOMY  1985   TAH (ovaries remain)   BREAST LUMPECTOMY Left 2007   BREAST LUMPECTOMY Right 2019   BREAST LUMPECTOMY WITH RADIOACTIVE SEED AND SENTINEL LYMPH NODE BIOPSY Right 07/22/2017   Procedure: BREAST LUMPECTOMY WITH RADIOACTIVE SEED AND SENTINEL LYMPH NODE BIOPSY;  Surgeon: Harriette Bouillon, MD;  Location: [facility];  Service: General;  Laterality: Right;   COLONOSCOPY     LEFT HEART CATH AND CORONARY ANGIOGRAPHY N/A 05/03/2023   Procedure: LEFT HEART CATH AND CORONARY ANGIOGRAPHY;  Surgeon: Yvonne Kendall, MD;  Location: [facility];  Service: Cardiovascular;  Laterality: N/A;   TONSILLECTOMY     age 34    Current Medications: Current Meds  Medication Sig   anastrozole (ARIMIDEX) 1 MG tablet Take 1 tablet (1 mg total) by mouth daily.  aspirin EC 81 MG tablet Take 1 tablet (81 mg total) by mouth daily. Swallow whole.   carvedilol (COREG) 3.125 MG tablet Take 1 tablet (3.125 mg total) by mouth 2 (two) times daily.   chlorpheniramine (CHLOR-TRIMETON) 4 MG tablet Take 4 mg by mouth 2 (two) times daily as needed for allergies.   Cholecalciferol (VITAMIN D3) 1000 units CAPS Take 1,000 Units by mouth daily.   DULoxetine (CYMBALTA) 60 MG capsule Take 60 mg by mouth daily.    famotidine (PEPCID) 40 MG tablet Take 40 mg by mouth daily.   gabapentin (NEURONTIN) 300 MG capsule Take 300 mg by mouth at bedtime.   Iron-Vitamins (GERITOL PO) Take 1 tablet by mouth daily.   nitroGLYCERIN (NITROSTAT) 0.4 MG SL tablet Place 0.4 mg under the tongue every 5 (five) minutes as needed for chest pain.   OZEMPIC, 0.25 OR 0.5 MG/DOSE, 2 MG/3ML SOPN Inject 0.5 mg into the skin once a week.    pantoprazole (PROTONIX) 40 MG tablet Take 40 mg by mouth 2 (two) times daily.   rosuvastatin (CRESTOR) 10 MG tablet Take 1 tablet (10 mg total) by mouth daily.   sacubitril-valsartan (ENTRESTO) 24-26 MG Take 1 tablet by mouth 2 (two) times daily.   triamterene-hydrochlorothiazide (MAXZIDE-25) 37.5-25 MG tablet Take 0.5 tablets by mouth daily.   WELLBUTRIN SR 150 MG 12 hr tablet Take 150 mg by mouth daily as needed (depression).     Allergies:   Crab extract, Crab [shellfish allergy], Sulfa antibiotics, Lipitor [atorvastatin], Oxcarbazepine, and Sulfa drugs cross reactors   Social History   Socioeconomic History   Marital status: Widowed    Spouse name: Not on file   Number of children: 0   Years of education: Not on file   Highest education level: Not on file  Occupational History   Occupation: accounts payable    Employer: [employer]  Tobacco Use   Smoking status: Never   Smokeless tobacco: Never  Vaping Use   Vaping status: Never Used  Substance and Sexual Activity   Alcohol use: No   Drug use: No   Sexual activity: Not Currently    Birth control/protection: Surgical    Comment: hysterectomy  Other Topics Concern   Not on file  Social History Narrative   No children   Married   Enjoys quilting and horseback riding.    She works in [employer].   She completed 2 years of college.    Social Drivers of Health   Financial Resource Strain: Medium Risk (06/11/2021)   [facility] to [facility]16[facility] visits prior to 05/16/2022[facility]   Overall Financial Resource Strain (CARDIA)    Difficulty of Paying Living Expenses: Somewhat hard  Food Insecurity: Low Risk  (09/27/2022)   Received from [facility]   Hunger Vital Sign    Worried About Running Out of Food in the Last Year: Never true    Ran Out of Food in the Last Year: Never true  Transportation Needs:  Not on file (09/27/2022)  Physical Activity: Inactive (06/11/2021)   [facility] to [facility]16[facility]2022[facility], [facility] visits prior to 05/16/2022., [facility]   Exercise Vital Sign    Days of Exercise per Week: 0 days    Minutes of Exercise per Session: 0 min  Stress: Stress Concern Present (06/11/2021)   [facility] prior to 05/16/2022[facility], [facility] visits prior to 05/16/2022., [facility]   [employer] of Occupational Health - Occupational Stress Questionnaire    Feeling of Stress : To some extent  Social Connections: Moderately Isolated (06/11/2021)   [facility] from [facility] prior to 05/16/2022[facility], [facility] visits prior to 05/16/2022., [facility]   Social Connection and Isolation Panel [NHANES]    Frequency of Communication with Friends and Family: More than three times a week    Frequency of Social Gatherings with Friends and Family: Never    Attends Religious Services: More than 4 times per year    Active Member of [employer] or Organizations: No    Attends Banker Meetings: Never    Marital Status: Widowed     Family History: The patient's family history includes Allergic rhinitis in her sister; Asthma in her brother; Breast cancer in her cousin and maternal aunt; Colon cancer in her cousin; Colon cancer (age of onset: 29) in her mother; Colon polyps in her sister; Coronary artery disease (age of onset: 17) in her brother; Diabetes in her brother, mother, and sister; Heart disease in her father; Hypertension in her mother and sister; Kidney disease in her mother; Lung cancer in her father; Ovarian cancer in her paternal grandmother. There is no history of Esophageal cancer, Stomach cancer, Rectal cancer, Pancreatic cancer, or Liver disease.  ROS:   Please see  the history of present illness.    All other systems reviewed and are negative.  EKGs/Labs/Other Studies Reviewed:    The following studies were reviewed today: I discussed my findings with the patient   Recent Labs: 03/03/2023: ALT 18 04/30/2023: Hemoglobin 14.4; Platelets 293 05/20/2023: BUN 18; Creatinine, Ser 1.07; Magnesium 2.1; NT-Pro BNP 183; Potassium 4.2; Sodium 141  Recent Lipid Panel    Component Value Date/Time   CHOL 216 (H) 06/09/2016 0854   TRIG 206 (H) 06/09/2016 0854   HDL 36 (L) 06/09/2016 0854   CHOLHDL 6.0 (H) 06/09/2016 0854   VLDL 41 (H) 06/09/2016 0854   LDLCALC 139 (H) 06/09/2016 0854    Physical Exam:    VS:  BP (!) 128/56   Pulse 68   Ht 5\' 4"  (1.626 m)   Wt 221 lb 1.9 oz (100.3 kg)   LMP 03/17/1983   SpO2 98%   BMI 37.96 kg/m     Wt Readings from Last 3 Encounters:  06/10/23 221 lb 1.9 oz (100.3 kg)  05/20/23 213 lb (96.6 kg)  05/03/23 214 lb (97.1 kg)     GEN: Patient is in no acute distress HEENT: Normal NECK: No JVD; No carotid bruits LYMPHATICS: No lymphadenopathy CARDIAC: Hear sounds regular, 2/6 systolic murmur at the apex. RESPIRATORY:  Clear to auscultation without rales, wheezing or rhonchi  ABDOMEN: Soft, non-tender, non-distended MUSCULOSKELETAL:  No edema; No deformity  SKIN: Warm and dry NEUROLOGIC:  Alert and oriented x 3 PSYCHIATRIC:  Normal affect   Signed, Katelyn Brothers, MD  06/10/2023 1:19 PM    [facility]

## 2023-06-10 NOTE — Patient Instructions (Signed)

## 2023-06-15 ENCOUNTER — Encounter: Payer: Self-pay | Admitting: Cardiology

## 2023-06-17 NOTE — Telephone Encounter (Signed)
 I sent you a message regarding her monitor as you requested labs which were done. Please advise

## 2023-06-22 ENCOUNTER — Encounter: Payer: Self-pay | Admitting: Cardiology

## 2023-07-28 ENCOUNTER — Encounter: Payer: Self-pay | Admitting: Pulmonary Disease

## 2023-07-28 ENCOUNTER — Ambulatory Visit: Admitting: Pulmonary Disease

## 2023-07-28 VITALS — BP 140/70 | HR 77 | Ht 64.0 in | Wt 218.2 lb

## 2023-07-28 DIAGNOSIS — J370 Chronic laryngitis: Secondary | ICD-10-CM

## 2023-07-28 DIAGNOSIS — J387 Other diseases of larynx: Secondary | ICD-10-CM

## 2023-07-28 MED ORDER — BREZTRI AEROSPHERE 160-9-4.8 MCG/ACT IN AERO: 2.0000 | INHALATION_SPRAY | Freq: Two times a day (BID) | RESPIRATORY_TRACT | Status: DC

## 2023-07-28 NOTE — Progress Notes (Signed)
 Synopsis: Referred for chronic cough by Rodman Clam, DO  Subjective:   PATIENT ID: Katelyn Porter GENDER: female DOB: 12/25/1950, MRN: [id number]  Chief Complaint  Patient presents with   Follow-up    Coughing, no mucus. Started 3 years ago   73 y.o. with history of left breast cancer s/p XRT 2008, GERD seen for chronic cough.  Multiple prior pulmonary notes reviewed.  Multiple prior ENT notes reviewed.    Last seen 2023, recommended follow-up as needed.  Cough largely driven by upper airway issues, followed closely by ENT.  Cardiologist advised her to come back to see pulmonary with ongoing symptoms.  She has tried ICS/LABA therapy in the past without improvement.  In fact, she had issues with thrush, adverse events.  She last saw ENT 01/2023.  At that time she was on gabapentin  300 at night, 100 twice a day during the day.  She sleeps without issue at night no cough at night.  She wakes up and is pretty sleepy during the day.  Sometimes will take a 100 mg dose once during the day and it does help but she is so sleepy it limits her ability to take it often.  ENT note indicates they would try a different medication if still sleepy.  She does not have a follow-up visit with them.  She was seen by Dr. Miki Alert, laryngologist at [facility] in the past.  Recommended steroid injection per patient report.  HPI at initial visit: Cough has been going on since October. Saw Dr. Elvin Hammer with GI and has had a couple of swallow evals. Has been on pantoprazole  40 BID taking 30 min before meals. Only occasional sensation of postnasal drainage. She has developed some hoarseness.   She saw ENT 04/24/21, normal FNL.  No DOE. Never had asthma as a kid.   Father had lung cancer  She is retired from working in Audiological scientist. Has never lived outside of [place]. She has a cat that lives outside, 2 horses. She does clean the stalls. Never smoked. Husband used to smoke.    Past Medical History:  Diagnosis Date    Acute systolic congestive heart failure (HCC) 03/17/2022   Aortic atherosclerosis (HCC) 03/03/2023   At risk for sleep apnea 12/19/2021   Bilateral carpal tunnel syndrome 10/27/2019   Breast cancer (HCC) 2008   left, with radiation finished 08, tamoxifen   Breast cancer (HCC) 2019   Rt. 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   Last Assessment & Plan:   Stable off meds for last month. Will monitor.  Last Assessment & Plan:   Stable on current meds, continue same.   Last Assessment & Plan:   Stable off meds for last month. Will monitor.     Familial hyperlipidemia 03/03/2023  Family history of breast cancer    Family history of colon cancer    Family history of ovarian cancer 07/21/2017   Gastro-esophageal reflux disease without esophagitis 01/24/2013   Overview:   Last Assessment & Plan:   Improved on prilosec .   Last Assessment & Plan:   Improved on prilosec .   Last Assessment & Plan:   Improved on prilosec .   Last Assessment & Plan:   Improved on prilosec .     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  The Hereditary Gene Panel offered by [facility] includes sequencing and/or deletion duplication testing of the following 47 genes: APC, ATM, AXIN2, BARD1, BMPR1A, BRCA1, BRCA2, BRIP1, CDH1, CDK4, CDKN2A (p14ARF), CDKN2A (p16INK4a),    Hallux valgus (acquired), right foot 08/11/2016   Hand numbness 10/27/2019   HFrEF (heart failure with reduced ejection fraction) (HCC) 03/17/2022   Hot flashes related to aromatase inhibitor therapy 04/21/2018   Hyperlipidemia    Hypertension    IBS (irritable bowel syndrome)    IFG (impaired fasting glucose) 06/18/2021   Impacted cerumen of right ear 02/27/2019   Irritable larynx 06/12/2021   Loss of consciousness (HCC) 10/10/2021   Major depressive disorder, single episode 01/24/2013   Last Assessment & Plan:   Stable on zoloft , continue same.   Last Assessment & Plan:   Stable on zoloft , continue same.     Malignant neoplasm of upper-outer quadrant of right breast in female, estrogen receptor positive (HCC) 06/29/2017   Monoallelic mutation of ATM gene 16/12/9602   NSVT (nonsustained ventricular tachycardia) (HCC) 03/11/2021   Obesity (BMI 35.0-39.9 without comorbidity) 03/03/2023   Other sites of candidiasis 05/25/2022   Other spondylosis with radiculopathy, lumbar region 06/26/2020   Personal history of radiation therapy 2007   left breast   Personal history of radiation therapy 2019   right breast   Recurrent major depressive disorder, in partial remission (HCC) 01/24/2013   Last Assessment & Plan:   Stable on zoloft , continue same.     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   Tinnitus 08/07/2019   Trigeminal neuralgia of right side of face 01/13/2023   Right ear     Type 2 diabetes mellitus without complication, without long-term current use of  insulin (HCC) 05/07/2022   Ulnar nerve entrapment at elbow, left 10/27/2019   Vertigo    Vitamin D  deficiency 08/12/2017     Family History  Problem Relation Age of Onset   Colon cancer Mother 6   Hypertension Mother    Kidney disease Mother    Diabetes Mother    Lung cancer Father        Died at 81 from lung cancer   Heart disease Father        heart attack   Allergic rhinitis Sister    Hypertension Sister    Diabetes Sister    Colon polyps Sister    Asthma Brother    Coronary artery disease Brother 100       died of "massive heart attack"   Diabetes Brother    Breast cancer Maternal Aunt        dx in her 19s   Ovarian cancer Paternal Grandmother  Colon cancer Cousin        maternal 1st cousin dx over 96   Breast cancer Cousin        mat 1st cousin dx in her 73s   Esophageal cancer Neg Hx    Stomach cancer Neg Hx    Rectal cancer Neg Hx    Pancreatic cancer Neg Hx    Liver disease Neg Hx      Past Surgical History:  Procedure Laterality Date   ABDOMINAL HYSTERECTOMY  1985   TAH (ovaries remain)   BREAST LUMPECTOMY Left 2007   BREAST LUMPECTOMY Right 2019   BREAST LUMPECTOMY WITH RADIOACTIVE SEED AND SENTINEL LYMPH NODE BIOPSY Right 07/22/2017   Procedure: BREAST LUMPECTOMY WITH RADIOACTIVE SEED AND SENTINEL LYMPH NODE BIOPSY;  Surgeon: Sim Dryer, MD;  Location: [facility];  Service: General;  Laterality: Right;   COLONOSCOPY     LEFT HEART CATH AND CORONARY ANGIOGRAPHY N/A 05/03/2023   Procedure: LEFT HEART CATH AND CORONARY ANGIOGRAPHY;  Surgeon: Sammy Crisp, MD;  Location: [facility];  Service: Cardiovascular;  Laterality: N/A;   TONSILLECTOMY     age 89    Social History   Socioeconomic History   Marital status: Widowed    Spouse name: Not on file   Number of children: 0   Years of education: Not on file   Highest education level: Not on file  Occupational History   Occupation: accounts payable    Employer: [employer]  Tobacco Use   Smoking status: Never   Smokeless tobacco: Never  Vaping Use   Vaping status: Never Used  Substance and Sexual Activity   Alcohol use: No   Drug use: No   Sexual activity: Not Currently    Birth control/protection: Surgical    Comment: hysterectomy  Other Topics Concern   Not on file  Social History Narrative   No children   Married   Enjoys quilting and horseback riding.    She works in Civil Service fast streamer.   She completed 2 years of college.    Social Drivers of Health   Financial Resource Strain: Medium Risk (06/11/2021)   [facility] prior to [facility]/16[facility]22[facility], [facility] visits prior to 05/16/2022., [facility]   Overall Financial Resource Strain (CARDIA)    Difficulty of Paying Living Expenses: Somewhat hard  Food Insecurity: Low Risk  (09/27/2022)   [facility]   Hunger Vital Sign    Worried About Running Out of Food in the Last Year: Never true    Ran Out of Food in the Last Year: Never true  Transportation Needs: Not on file (09/27/2022)  Physical Activity: Inactive (06/11/2021)   [facility] prior to 05/16/2022[facility], [facility] visits prior to 05/16/2022., [facility]   Exercise Vital Sign    Days of Exercise per Week: 0 days    Minutes of Exercise per Session: 0 min  Stress: Stress Concern Present (06/11/2021)   [facility] prior to 05/16/2022., [facility], [facility] visits prior to 05/16/2022., [facility]   [employer] of Occupational Health - Occupational Stress Questionnaire    Feeling of Stress : To some extent  Social Connections: Moderately Isolated (06/11/2021)   [facility] from [facility] visits prior to 05/16/2022., [facility], [facility] visits prior to  05/16/2022., [facility]   Social Connection and Isolation Panel [NHANES]    Frequency of Communication with Friends and Family: More than three times a week    Frequency of Social Gatherings with Friends and Family: Never    Attends Religious Services: More than 4 times per year    Active Member of [employer] or Organizations: No    Attends Banker Meetings: Never    Marital Status: Widowed  Intimate Partner Violence: Not At Risk (06/11/2021)   Received from [facility] visits prior to 05/16/2022., [facility] visits prior to 05/16/2022.   Humiliation, Afraid, Rape, and Kick questionnaire    Fear of Current or Ex-Partner: No    Emotionally Abused: No    Physically Abused: No    Sexually Abused: No     Allergies  Allergen Reactions   Crab Extract Swelling    Blisters on tongue.   Crab [Shellfish Allergy] Swelling    Blisters on tongue.   Sulfa Antibiotics Other (See Comments)    headache   Lipitor [Atorvastatin ]     Muscle cramping   Oxcarbazepine     Other Reaction(s): GI Intolerance   Sulfa Drugs Cross Reactors      Outpatient Medications Prior to Visit  Medication Sig Dispense Refill   anastrozole  (ARIMIDEX ) 1 MG tablet Take 1 tablet (1 mg total) by mouth daily. 90 tablet 3   aspirin  EC 81 MG tablet Take 1 tablet (81 mg total) by mouth daily. Swallow whole.     carvedilol  (COREG ) 3.125 MG tablet Take 1 tablet (3.125 mg total) by mouth 2 (two) times daily. 60 tablet 11   chlorpheniramine (CHLOR-TRIMETON) 4 MG tablet Take 4 mg by mouth 2 (two) times daily as needed for allergies.     Cholecalciferol (VITAMIN D3) 1000 units CAPS Take 1,000 Units by mouth daily.     DULoxetine  (CYMBALTA ) 60 MG capsule Take 60 mg by mouth daily.      famotidine (PEPCID) 40 MG tablet Take 40 mg by mouth daily.     gabapentin  (NEURONTIN ) 300 MG capsule Take 300 mg by mouth at bedtime.     Iron-Vitamins (GERITOL PO) Take 1 tablet by mouth daily.      nitroGLYCERIN  (NITROSTAT ) 0.4 MG SL tablet Place 0.4 mg under the tongue every 5 (five) minutes as needed for chest pain.     pantoprazole  (PROTONIX ) 40 MG tablet Take 40 mg by mouth 2 (two) times daily.     rosuvastatin  (CRESTOR ) 10 MG tablet Take 1 tablet (10 mg total) by mouth daily. 90 tablet 3   triamterene -hydrochlorothiazide (MAXZIDE -25) 37.5-25 MG tablet Take 0.5 tablets by mouth daily. 45 tablet 2   WELLBUTRIN  SR 150 MG 12 hr tablet Take 150 mg by mouth daily as needed (depression).     OZEMPIC, 0.25 OR 0.5 MG/DOSE, 2 MG/3ML SOPN Inject 0.5 mg into the skin once a week. (Patient not taking: Reported on 07/28/2023)     sacubitril -valsartan  (ENTRESTO ) 24-26 MG Take 1 tablet by mouth 2 (two) times daily. 180 tablet 2   No facility-administered medications prior to visit.       Objective:   Physical Exam:  General appearance: 73 y.o., female, NAD, conversant  Eyes: anicteric sclerae, EOMI HENT: NCAT; MMM Neck: Trachea midline; no JVD Lungs: CTAB, no crackles, no wheeze, with normal respiratory effort CV: Warm no edema Abdomen: Not Skin: Normal turgor and texture; no rash Psych: Appropriate affect, normal  mood Neuro: Alert and oriented to person and place, no focal deficit     Vitals:   07/28/23 0844  BP: (!) 140/70  Pulse: 77  SpO2: 96%  Weight: 218 lb 3.2 oz (99 kg)  Height: 5\' 4"  (1.626 m)      96% on RA BMI Readings from Last 3 Encounters:  07/28/23 37.45 kg/m  06/10/23 37.96 kg/m  05/20/23 36.56 kg/m   Wt Readings from Last 3 Encounters:  07/28/23 218 lb 3.2 oz (99 kg)  06/10/23 221 lb 1.9 oz (100.3 kg)  05/20/23 213 lb (96.6 kg)     CBC    Component Value Date/Time   WBC 6.4 04/30/2023 1003   WBC 5.2 01/26/2023 1131   WBC 4.9 01/06/2023 0901   RBC 4.77 04/30/2023 1003   RBC 4.68 01/26/2023 1131   HGB 14.4 04/30/2023 1003   HGB 13.1 06/01/2011 1353   HCT 43.6 04/30/2023 1003   HCT 39.0 06/01/2011 1353   PLT 293 04/30/2023 1003   MCV 91  04/30/2023 1003   MCV 90.4 06/01/2011 1353   MCH 30.2 04/30/2023 1003   MCH 29.7 01/26/2023 1131   MCHC 33.0 04/30/2023 1003   MCHC 33.3 01/26/2023 1131   RDW 12.6 04/30/2023 1003   RDW 12.3 06/01/2011 1353   LYMPHSABS 2.6 04/30/2023 1003   LYMPHSABS 2.1 06/01/2011 1353   MONOABS 0.5 01/26/2023 1131   MONOABS 0.5 06/01/2011 1353   EOSABS 0.2 04/30/2023 1003   BASOSABS 0.1 04/30/2023 1003   BASOSABS 0.1 06/01/2011 1353    Eos 100-200  Chest Imaging:  CT Chest 05/12/21 reviewed by me unremarkable  CXR today reviewed by me unchanged, final read pending  CXR 10/2018 reviewed by me unremarkable  CT A/P lung bases 2020 reviewed by me unremarkable  Barium swallow study on 02/25/21 demonstrated  mild dysmotility, intact primary wave but proximal escape of approximately 30% of the ingested bolus with stasis of a moderate amount in the proximal esophagus. Patient experienced cough during this time without clear cause.   No fixed narrowing.  No gastroesophageal reflux during observation.   Modified barium swallow study performed on 03/14/21 demonstrated no significant abnormality identified. No evidence of laryngeal penetration or aspiration  FNL by ENT 04/24/21: Flexible laryngoscopy shows patent anterior nasal cavity with minimal crusting, no discharge or infection.  Normal base of tongue and supraglottis Normal vocal cord mobility without vocal cord nodule, mass, polyp or tumor. Hypopharynx normal without mass, pooling of secretions or aspiration. Unremarkable exam.   Pulmonary Functions Testing Results:    Latest Ref Rng & Units 05/23/2021    3:34 PM  PFT Results  FVC-Pre L 2.36   FVC-Predicted Pre % 98   FVC-Post L 2.29   FVC-Predicted Post % 95   Pre FEV1/FVC % % 80   Post FEV1/FCV % % 84   FEV1-Pre L 1.90   FEV1-Predicted Pre % 101   FEV1-Post L 1.93   DLCO uncorrected ml/min/mmHg 17.40   DLCO UNC% % 89   DLCO corrected ml/min/mmHg 17.40   DLCO COR %Predicted % 89    DLVA Predicted % 120   TLC L 4.26   TLC % Predicted % 84   RV % Predicted % 76   Personally reviewed interpreted as normal spirometry, no bronchodilator response, lung volumes normal limits, DLCO within normal limit    Assessment & Plan:    # Chronic cough:  Has made great effort to address any components of GERD, post nasal drainage. Unclear  etiology, didn't have good symptomatic response to LABA/ICS and in fact just had issues with thrush, normal PFTs - asthma, nonasthmatic eosinophilic bronchitis unlikely. No evidence of parenchymal lung disease. She certainly appears to have multifactorial upper airway cough, followed closely by ENT and this probably constitutes the bulk of her issues with cough.     Plan: - albuterol  as needed, try breztri for 4 weeks if no better can stop, if improved will prescribe - PPI BID - continue flonase  1 spray each after clearing nose of crusting following a shower - gabapentin  300 mg nightly, 100 mg during the day with improved nocturnal symptoms - day time dosing limited by sleepiness - TLC measures reviewed to limit reflux, throat clearing - f/u with ENT in [facility] as they plan alternative medication regimen per last note - f/u with [facility] Jeannett [facility] at [facility] if medication meaures fail   RTC as needed   Guerry Leek, MD [employer] [facility] 07/28/2023 9:10 AM    I spent 42 minutes in the care of the patient including review of records, face-to-face care, coordination of care.

## 2023-07-28 NOTE — Patient Instructions (Signed)
 It is nice to meet you  Try Breztri 2 puffs in the morning, 2 puffs in the evening.  Rinse your mouth out thoroughly with water  after every use.  If you find this helps, contact me and I can prescribe it or an alternative and we can arrange follow-up.  Since the inhalers have not helped in the past, it is quite possible this will not help at this time.  C.  Worth trying.  [facility] and arrange follow-up.  They have a plan for a different medicine send the gabapentin  is making you sleepy during the day.  Consider following up with Dr. Miki Alert again in the future if the medicines are not helping.  Return to clinic as needed

## 2023-08-26 ENCOUNTER — Encounter (HOSPITAL_BASED_OUTPATIENT_CLINIC_OR_DEPARTMENT_OTHER): Payer: Self-pay | Admitting: Obstetrics & Gynecology

## 2023-08-26 ENCOUNTER — Ambulatory Visit (HOSPITAL_BASED_OUTPATIENT_CLINIC_OR_DEPARTMENT_OTHER): Admitting: Obstetrics & Gynecology

## 2023-08-26 VITALS — BP 141/67 | HR 84 | Ht 64.0 in | Wt 214.0 lb

## 2023-08-26 DIAGNOSIS — Z853 Personal history of malignant neoplasm of breast: Secondary | ICD-10-CM | POA: Diagnosis not present

## 2023-08-26 DIAGNOSIS — Z1501 Genetic susceptibility to malignant neoplasm of breast: Secondary | ICD-10-CM

## 2023-08-26 DIAGNOSIS — R6882 Decreased libido: Secondary | ICD-10-CM | POA: Diagnosis not present

## 2023-08-26 DIAGNOSIS — Z1379 Encounter for other screening for genetic and chromosomal anomalies: Secondary | ICD-10-CM

## 2023-08-26 NOTE — Progress Notes (Signed)
 GYNECOLOGY  VISIT  CC:   libido concerns  HPI: 73 y.o. G1P0 Widowed Black or [place] American female here for concerns about libido.  Is in a good relationship and is SA.  Feels until about a few months ago, had a good libido.  Now, feels like it is definitely decreased and she wants to know if there is a reason why and if there is any treatment.    She has been menopausal for many years.  Also, has hx of left breast cancer in 2008, treated with lumpectomy, radiation and then tamoxifen x 5 years.  Then she was diagnosed with a second, but right sided, breast cancer in 06/2017.   Treated with lumpectomy with sentinel node biopsy, radiation and then anastrozole .  Followed by Dr. Arno Bibles.     Has been diagnosed with nonischemic cardiomyopathy with reduced EF after going to the hospital with dyspnea on exertion.  Medications were changed.  Amlodipine stopped.  Diuretic combination was decreased by 1/2 and she started entresto .  I did review this in Up to Date and decreased libido can be seen with this.  Also, possibly just this new diagnosis and the significance of it as well could contribute.    Given her breast cancer history, do not feel she should be on any hormonal therapy.  Also, do not recommend she make any changed to her heart related medications.  She is comfortable with this.  Lastly, there is a note about her hx of ATM gene mutation.  Pancreatic cancer screening now recommended.  Reviewed with pt.  She will discuss with her PCP and Dr. Arno Bibles.  Also, she has hx of ovarian cyst and last u/s was 2023.  There is not a recommendation about risk reducing BSO.  She does have a paternal grandmother who was diagnosed with ovarian cancer later in life.  Her ATM gene mutation does appear to be in the maternal line.  Will consider updating ultrasound this year.     Past Medical History:  Diagnosis Date   Acute systolic congestive heart failure (HCC) 03/17/2022   Aortic atherosclerosis (HCC) 03/03/2023    At risk for sleep apnea 12/19/2021   Bilateral carpal tunnel syndrome 10/27/2019   Breast cancer (HCC) 2008   left, with radiation finished 08, tamoxifen   Breast cancer (HCC) 2019   Rt. 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   Last Assessment & Plan:   Stable off meds for last month. Will monitor.  Last Assessment & Plan:   Stable on current meds, continue same.   Last Assessment & Plan:   Stable off meds for last month. Will monitor.     Familial hyperlipidemia 03/03/2023   Family history of breast cancer    Family history of colon cancer  Family history of ovarian cancer 07/21/2017   Gastro-esophageal reflux disease without esophagitis 01/24/2013   Overview:   Last Assessment & Plan:   Improved on prilosec .   Last Assessment & Plan:   Improved on prilosec .   Last Assessment & Plan:   Improved on prilosec .   Last Assessment & Plan:   Improved on prilosec .     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  The Hereditary Gene Panel offered by [facility] includes sequencing  and/or deletion duplication testing of the following 47 genes: APC, ATM, AXIN2, BARD1, BMPR1A, BRCA1, BRCA2, BRIP1, CDH1, CDK4, CDKN2A (p14ARF), CDKN2A (p16INK4a),    Hallux valgus (acquired), right foot 08/11/2016   Hand numbness 10/27/2019   HFrEF (heart failure with reduced ejection fraction) (HCC) 03/17/2022   Hot flashes related to aromatase inhibitor therapy 04/21/2018   Hyperlipidemia    Hypertension    IBS (irritable bowel syndrome)    IFG (impaired fasting glucose) 06/18/2021   Impacted cerumen of right ear 02/27/2019   Irritable larynx 06/12/2021   Loss of consciousness (HCC) 10/10/2021   Major depressive disorder, single episode 01/24/2013   Last Assessment & Plan:   Stable on zoloft , continue same.   Last Assessment & Plan:   Stable on zoloft , continue same.     Malignant neoplasm of upper-outer quadrant of right breast in female, estrogen receptor positive (HCC) 06/29/2017   Monoallelic mutation of ATM gene 16/12/9602   NSVT (nonsustained ventricular tachycardia) (HCC) 03/11/2021   Obesity (BMI 35.0-39.9 without comorbidity) 03/03/2023   Other sites of candidiasis 05/25/2022   Other spondylosis with radiculopathy, lumbar region 06/26/2020   Personal history of radiation therapy 2007   left breast   Personal history of radiation therapy 2019   right breast   Recurrent major depressive disorder, in partial remission (HCC) 01/24/2013   Last Assessment & Plan:   Stable on zoloft , continue same.     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 90 tablet 3   aspirin  EC 81 MG tablet Take 1 tablet (81 mg total) by mouth daily. Swallow whole.     budeson-glycopyrrolate-formoterol  (BREZTRI  AEROSPHERE) 160-9-4.8 MCG/ACT AERO inhaler Inhale 2 puffs into the lungs in the morning and at bedtime.     carvedilol  (COREG ) 3.125 MG tablet Take 1 tablet (3.125 mg total) by mouth 2 (two) times daily. 60 tablet 11   chlorpheniramine (CHLOR-TRIMETON) 4 MG tablet Take 4 mg by mouth 2 (two) times daily as needed for allergies.     Cholecalciferol (VITAMIN D3) 1000 units CAPS Take 1,000 Units by mouth daily.     DULoxetine  (CYMBALTA ) 60 MG capsule Take 60 mg by mouth daily.  famotidine (PEPCID) 40 MG tablet Take 40 mg by mouth daily.     gabapentin  (NEURONTIN ) 300 MG capsule Take 300 mg by mouth at bedtime.     Iron-Vitamins (GERITOL PO) Take 1 tablet by mouth daily.     pantoprazole  (PROTONIX ) 40 MG tablet Take 40 mg by mouth 2 (two) times daily.     rosuvastatin  (CRESTOR ) 10 MG tablet Take 1 tablet (10 mg total) by mouth daily. 90 tablet 3   triamterene -hydrochlorothiazide (MAXZIDE -25) 37.5-25 MG tablet Take 0.5 tablets by mouth daily. 45 tablet 2   nitroGLYCERIN  (NITROSTAT ) 0.4 MG SL tablet Place 0.4 mg under the tongue every 5 (five) minutes as needed for chest pain.     WELLBUTRIN  SR 150 MG 12 hr tablet Take 150 mg by mouth daily as needed (depression).     No current facility-administered medications on file prior to visit.    ALLERGIES: Crab extract, Crab [shellfish allergy], Sulfa antibiotics, Lipitor [atorvastatin ], Oxcarbazepine, and Sulfa drugs cross reactors  SH:  widowed, non smoker  Review of Systems  Constitutional: Negative.     PHYSICAL EXAMINATION:    BP (!) 141/67 (BP Location: Left Arm, Patient Position: Sitting)    Pulse 84   Ht 5' 4 (1.626 m)   Wt 214 lb (97.1 kg)   LMP 03/17/1983   BMI 36.73 kg/m     Physical Exam Constitutional:      Appearance: Normal appearance.   Neurological:     General: No focal deficit present.     Mental Status: She is alert.   Psychiatric:        Mood and Affect: Mood normal.     Assessment/Plan: 1. Decreased libido (Primary) - discussed possible causes.  Given new cardiovascular diagnosis, do not think she should make any changes to medications with input of her cardiologist - also, do not think she should be on any hormonal method given hx of breast cancer x 2.  She is comfortable with our discussion today  2. ATM gene mutation positive - discussed newer guidelines for pancreatic cancer screening.  She will discuss with PCP and oncologist, Dr. Arno Bibles - will plan to repeat ultrasound  3. History of breast cancer  Total time with pt:  18 minutes Documentation:  15 minutes Total time:  33 minutes

## 2023-08-29 ENCOUNTER — Encounter (HOSPITAL_BASED_OUTPATIENT_CLINIC_OR_DEPARTMENT_OTHER): Payer: Self-pay | Admitting: Obstetrics & Gynecology

## 2023-08-29 DIAGNOSIS — Z1501 Genetic susceptibility to malignant neoplasm of breast: Secondary | ICD-10-CM | POA: Insufficient documentation

## 2023-08-31 ENCOUNTER — Other Ambulatory Visit (HOSPITAL_BASED_OUTPATIENT_CLINIC_OR_DEPARTMENT_OTHER): Payer: Self-pay | Admitting: Obstetrics & Gynecology

## 2023-08-31 DIAGNOSIS — N83202 Unspecified ovarian cyst, left side: Secondary | ICD-10-CM

## 2023-08-31 DIAGNOSIS — Z853 Personal history of malignant neoplasm of breast: Secondary | ICD-10-CM

## 2023-08-31 DIAGNOSIS — Z1501 Genetic susceptibility to malignant neoplasm of breast: Secondary | ICD-10-CM

## 2023-09-02 ENCOUNTER — Telehealth: Payer: Self-pay | Admitting: Internal Medicine

## 2023-09-02 NOTE — Telephone Encounter (Signed)
 Katelyn Porter, The role of advanced imaging for pancreatic cancer screening is yet to be solidified (particular in a 73 year old). If the patient's PCP feels that pancreatic cancer screening with advanced imaging is indicated for her patient, then she could certainly order such imaging. No need to follow-up here regarding this issue. Thanks, Dr. Elvin Hammer

## 2023-09-02 NOTE — Telephone Encounter (Signed)
 Spoke with pt and she is aqware of recommendations per Dr Elvin Hammer.

## 2023-09-02 NOTE — Telephone Encounter (Signed)
 Good Afternoon Dr. Elvin Hammer,  Patient stated she was advised by her primary care provider to have a pancreatic cancer screening.  Please review and advise on scheduling further   Thank you

## 2023-09-08 ENCOUNTER — Other Ambulatory Visit: Payer: Self-pay | Admitting: Internal Medicine

## 2023-09-08 DIAGNOSIS — Z1231 Encounter for screening mammogram for malignant neoplasm of breast: Secondary | ICD-10-CM

## 2023-09-10 ENCOUNTER — Other Ambulatory Visit: Payer: Self-pay

## 2023-09-10 ENCOUNTER — Emergency Department (HOSPITAL_BASED_OUTPATIENT_CLINIC_OR_DEPARTMENT_OTHER)
Admission: EM | Admit: 2023-09-10 | Discharge: 2023-09-10 | Disposition: A | Discharge status: Home / Self Care | Attending: Emergency Medicine | Admitting: Emergency Medicine

## 2023-09-10 ENCOUNTER — Emergency Department (HOSPITAL_BASED_OUTPATIENT_CLINIC_OR_DEPARTMENT_OTHER)

## 2023-09-10 ENCOUNTER — Encounter (HOSPITAL_BASED_OUTPATIENT_CLINIC_OR_DEPARTMENT_OTHER): Payer: Self-pay

## 2023-09-10 DIAGNOSIS — I2510 Atherosclerotic heart disease of native coronary artery without angina pectoris: Secondary | ICD-10-CM | POA: Diagnosis not present

## 2023-09-10 DIAGNOSIS — I5021 Acute systolic (congestive) heart failure: Secondary | ICD-10-CM | POA: Insufficient documentation

## 2023-09-10 DIAGNOSIS — R1013 Epigastric pain: Secondary | ICD-10-CM | POA: Insufficient documentation

## 2023-09-10 DIAGNOSIS — E119 Type 2 diabetes mellitus without complications: Secondary | ICD-10-CM | POA: Diagnosis not present

## 2023-09-10 DIAGNOSIS — I110 Hypertensive heart disease with heart failure: Secondary | ICD-10-CM | POA: Diagnosis not present

## 2023-09-10 DIAGNOSIS — Z79899 Other long term (current) drug therapy: Secondary | ICD-10-CM | POA: Diagnosis not present

## 2023-09-10 DIAGNOSIS — Z7982 Long term (current) use of aspirin: Secondary | ICD-10-CM | POA: Diagnosis not present

## 2023-09-10 LAB — COMPREHENSIVE METABOLIC PANEL WITH GFR
ALT: 23 U/L (ref 0–44)
AST: 23 U/L (ref 15–41)
Albumin: 4.4 g/dL (ref 3.5–5.0)
Alkaline Phosphatase: 105 U/L (ref 38–126)
Anion gap: 11 (ref 5–15)
BUN: 15 mg/dL (ref 8–23)
CO2: 27 mmol/L (ref 22–32)
Calcium: 9.9 mg/dL (ref 8.9–10.3)
Chloride: 102 mmol/L (ref 98–111)
Creatinine, Ser: 1 mg/dL (ref 0.44–1.00)
GFR, Estimated: >60 mL/min (ref >60)
Glucose, Bld: 111 mg/dL — ABNORMAL HIGH (ref 70–99)
Potassium: 4 mmol/L (ref 3.5–5.1)
Sodium: 140 mmol/L (ref 135–145)
Total Bilirubin: 0.6 mg/dL (ref 0.0–1.2)
Total Protein: 7.4 g/dL (ref 6.5–8.1)

## 2023-09-10 LAB — CBC
HCT: 42.8 % (ref 36.0–46.0)
Hemoglobin: 14.2 g/dL (ref 12.0–15.0)
MCH: 30.2 pg (ref 26.0–34.0)
MCHC: 33.2 g/dL (ref 30.0–36.0)
MCV: 91.1 fL (ref 80.0–100.0)
Platelets: 279 10*3/uL (ref 150–400)
RBC: 4.7 MIL/uL (ref 3.87–5.11)
RDW: 12.3 % (ref 11.5–15.5)
WBC: 7 10*3/uL (ref 4.0–10.5)
nRBC: 0 % (ref 0.0–0.2)

## 2023-09-10 LAB — URINALYSIS, ROUTINE W REFLEX MICROSCOPIC
Bacteria, UA: NONE SEEN
Bilirubin Urine: NEGATIVE
Glucose, UA: NEGATIVE mg/dL
Hgb urine dipstick: NEGATIVE
Ketones, ur: NEGATIVE mg/dL
Nitrite: NEGATIVE
Protein, ur: NEGATIVE mg/dL
Specific Gravity, Urine: 1.017 (ref 1.005–1.030)
pH: 6 (ref 5.0–8.0)

## 2023-09-10 LAB — LIPASE, BLOOD: Lipase: 24 U/L (ref 11–51)

## 2023-09-10 LAB — TROPONIN T, HIGH SENSITIVITY: Troponin T High Sensitivity: <15 ng/L (ref <19)

## 2023-09-10 MED ORDER — HYDROCODONE-ACETAMINOPHEN 5-325 MG PO TABS: 1.0000 | ORAL_TABLET | Freq: Four times a day (QID) | ORAL | 0 refills | Status: DC | PRN

## 2023-09-10 MED ORDER — IOHEXOL 300 MG/ML  SOLN
100.0000 mL | Freq: Once | INTRAMUSCULAR | Status: AC | PRN
  Administered 2023-09-10: 100 mL via INTRAVENOUS

## 2023-09-10 MED ORDER — HYDROCODONE-ACETAMINOPHEN 5-325 MG PO TABS
1.0000 | ORAL_TABLET | Freq: Once | ORAL | Status: AC
  Administered 2023-09-10: 1 via ORAL
  Filled 2023-09-10: qty 1

## 2023-09-10 NOTE — ED Triage Notes (Signed)
 Pt c/o abd pain, woke up in the middle of the night w it sore, thought it was constipation but no relief w medication. Advises swelling onset this morning, laying around, took milk of mag w no relief.   Last BM this morning but didn't feel complete.

## 2023-09-10 NOTE — Discharge Instructions (Addendum)
 Make an appointment follow-up with your doctor.  Take the pain medication as needed.  Continue your reflux medicine.  Workup negative for any acute cardiac event.

## 2023-09-10 NOTE — ED Provider Notes (Addendum)
 [place] [facility] AT [facility] Provider Note   CSN: [id number] Arrival date & time: 09/10/23  1624     Patient presents with: No chief complaint on file.   Katelyn Porter is a 73 y.o. female.   Patient during the night had epigastric abdominal discomfort.  She thought maybe it was due to constipation.  She took some milk of magnesia today without any relief.  [place] it was really more sore to lift her head up but not sort of press.  No real chest pain no shortness of breath with it.  But it was unusual for her.  Patient has a known history of reflux disease is on Prilosec  for that.  Past medical history significant for acute systolic congestive heart failure history of coronary disease hypertension hyperlipidemia irritable bowel syndrome.  Patient surgical wise has had an abdominal hysterectomy and last had cath was in February.  Patient also has diabetes       Prior to Admission medications   Medication Sig Start Date End Date Taking? Authorizing Provider  anastrozole  (ARIMIDEX ) 1 MG tablet Take 1 tablet (1 mg total) by mouth daily. 01/26/23   Iruku, Praveena, MD  aspirin  EC 81 MG tablet Take 1 tablet (81 mg total) by mouth daily. Swallow whole. 04/30/23   Revankar, Jennifer SAUNDERS, MD  budeson-glycopyrrolate-formoterol  (BREZTRI  AEROSPHERE) 160-9-4.8 MCG/ACT AERO inhaler Inhale 2 puffs into the lungs in the morning and at bedtime. 07/28/23   Hunsucker, Donnice SAUNDERS, MD  carvedilol  (COREG ) 3.125 MG tablet Take 1 tablet (3.125 mg total) by mouth 2 (two) times daily. 05/03/23 05/02/24  End, Lonni, MD  chlorpheniramine (CHLOR-TRIMETON) 4 MG tablet Take 4 mg by mouth 2 (two) times daily as needed for allergies.    [provider]  Cholecalciferol (VITAMIN D3) 1000 units CAPS Take 1,000 Units by mouth daily.    [provider]  DULoxetine  (CYMBALTA ) 60 MG capsule Take 60 mg by mouth daily.  06/22/17   [provider]  famotidine (PEPCID) 40 MG tablet Take  40 mg by mouth daily.    [provider]  gabapentin  (NEURONTIN ) 300 MG capsule Take 300 mg by mouth at bedtime.    [provider]  Iron-Vitamins (GERITOL PO) Take 1 tablet by mouth daily.    [provider]  nitroGLYCERIN  (NITROSTAT ) 0.4 MG SL tablet Place 0.4 mg under the tongue every 5 (five) minutes as needed for chest pain.    [provider]  pantoprazole  (PROTONIX ) 40 MG tablet Take 40 mg by mouth 2 (two) times daily. 02/24/22   [provider]  rosuvastatin  (CRESTOR ) 10 MG tablet Take 1 tablet (10 mg total) by mouth daily. 03/03/23 02/26/24  Revankar, Jennifer SAUNDERS, MD  triamterene -hydrochlorothiazide (MAXZIDE -25) 37.5-25 MG tablet Take 0.5 tablets by mouth daily. 05/20/23   Revankar, Jennifer SAUNDERS, MD    Allergies: Crab extract, Georgianne cerise allergy], Sulfa antibiotics, Lipitor [atorvastatin ], Oxcarbazepine, and Sulfa drugs cross reactors    Review of Systems  Constitutional:  Negative for chills and fever.  HENT:  Negative for ear pain and sore throat.   Eyes:  Negative for pain and visual disturbance.  Respiratory:  Negative for cough and shortness of breath.   Cardiovascular:  Negative for chest pain and palpitations.  Gastrointestinal:  Positive for abdominal pain. Negative for vomiting.  Genitourinary:  Negative for dysuria and hematuria.  Musculoskeletal:  Negative for arthralgias and back pain.  Skin:  Negative for color change and rash.  Neurological:  Negative for seizures  and syncope.  All other systems reviewed and are negative.   Updated Vital Signs BP (!) 165/75 (BP Location: Left Arm)   Pulse 89   Temp 98.1 F (36.7 C) (Oral)   Resp 18   Wt 97.5 kg   LMP 03/17/1983   SpO2 100%   BMI 36.90 kg/m   Physical Exam Vitals and nursing note reviewed.  Constitutional:      General: She is not in acute distress.    Appearance: Normal appearance. She is well-developed.  HENT:     Head: Normocephalic and atraumatic.   Eyes:      Conjunctiva/sclera: Conjunctivae normal.    Cardiovascular:     Rate and Rhythm: Normal rate and regular rhythm.     Heart sounds: No murmur heard. Pulmonary:     Effort: Pulmonary effort is normal. No respiratory distress.     Breath sounds: Normal breath sounds.  Abdominal:     General: There is no distension.     Palpations: Abdomen is soft.     Tenderness: There is no abdominal tenderness. There is no guarding.   Musculoskeletal:        General: No swelling.     Cervical back: Neck supple.   Skin:    General: Skin is warm and dry.     Capillary Refill: Capillary refill takes less than 2 seconds.   Neurological:     General: No focal deficit present.     Mental Status: She is alert and oriented to person, place, and time.   Psychiatric:        Mood and Affect: Mood normal.     (all labs ordered are listed, but only abnormal results are displayed) Labs Reviewed  COMPREHENSIVE METABOLIC PANEL WITH GFR - Abnormal; Notable for the following components:      Result Value   Glucose, Bld 111 (*)    All other components within normal limits  URINALYSIS, ROUTINE W REFLEX MICROSCOPIC - Abnormal; Notable for the following components:   Leukocytes,Ua SMALL (*)    All other components within normal limits  LIPASE, BLOOD  CBC  TROPONIN T, HIGH SENSITIVITY    EKG: None  Radiology: CT ABDOMEN PELVIS W CONTRAST Result Date: 09/10/2023 CLINICAL DATA:  Acute nonlocalized abdominal pain EXAM: CT ABDOMEN AND PELVIS WITH CONTRAST TECHNIQUE: Multidetector CT imaging of the abdomen and pelvis was performed using the standard protocol following bolus administration of intravenous contrast. RADIATION DOSE REDUCTION: This exam was performed according to the departmental dose-optimization program which includes automated exposure control, adjustment of the mA and/or kV according to patient size and/or use of iterative reconstruction technique. CONTRAST:  OMNIPAQUE  IOHEXOL  300 MG/ML   SOLN COMPARISON:  CT 10/22/2018 FINDINGS: Lower chest: No acute abnormality. Hepatobiliary: Unremarkable liver. Normal gallbladder. No biliary dilation. Pancreas: Unremarkable. Spleen: Unremarkable. Adrenals/Urinary Tract: Normal adrenal glands. Malrotated right kidney. No urinary calculi or hydronephrosis. Bladder is unremarkable. Stomach/Bowel: Normal caliber large and small bowel. No bowel wall thickening. The appendix is normal.Stomach is within normal limits. Vascular/Lymphatic: No significant vascular findings are present. No enlarged abdominal or pelvic lymph nodes. Reproductive: 3.8 cm left adnexal cyst, similar to slight increase in size since pelvic ultrasound 10/07/2020 when it measured 3.6 cm. Hysterectomy. No right adnexal mass. Other: No free intraperitoneal fluid or air. Musculoskeletal: No acute fracture. IMPRESSION: No acute abnormality in the abdomen or pelvis. Electronically Signed   By: Norman Gatlin M.D.   On: 09/10/2023 20:19     Procedures   Medications  Ordered in the ED  HYDROcodone -acetaminophen  (NORCO/VICODIN) 5-325 MG per tablet 1 tablet (has no administration in time range)  iohexol  (OMNIPAQUE ) 300 MG/ML solution 100 mL (100 mLs Intravenous Contrast Given 09/10/23 2003)                                    Medical Decision Making Amount and/or Complexity of Data Reviewed Labs: ordered.  Risk Prescription drug management.   Patient's labs here urinalysis normal.  Lipase normal.  Complete metabolic panel normal glucose 888 anion gap normal.  White blood cell count 7 hemoglobin 14.2 platelets 279 CT abdomen and pelvis without any acute findings at all.  There is a persistent left adnexal cyst measuring 3.8 cm today back in 2022 it measured 3.6.  Patient is aware that and her primary care doctors have been following that.  Not too alarming because no significant change in size.  Patient does have a history of cardiac disease so probably reasonable to get troponin and  EKG.  Will going give a dose of hydrocodone  here.  If EKG and troponin without any significant changes.  Patient can be discharged home.  With some hydrocodone  to take at home.  Pain does seem to maybe be musculoskeletal in nature since it hurts more to raise her head.  But will be careful and make sure it is not atypical cardiac presentation.  Patient initial troponin less than 15.  With this occurring almost 24 hours ago.  No concerns for acute cardiac event.  Final diagnoses:  Epigastric pain    ED Discharge Orders     None          Geraldene Hamilton, MD 09/10/23 2224    Geraldene Hamilton, MD 09/10/23 2317

## 2023-10-03 NOTE — Progress Notes (Unsigned)
 Ellouise Console, PA-C [address] [place], [place]  [address] Phone: [phone]   Primary Care Physician: Juliene Asberry NOVAK, DO  Primary Gastroenterologist:  Ellouise Console, PA-C / Elspeth Naval, MD   Chief Complaint:  ***       HPI:   Katelyn Porter is a 73 y.o. female  Current Outpatient Medications  Medication Sig Dispense Refill   anastrozole  (ARIMIDEX ) 1 MG tablet Take 1 tablet (1 mg total) by mouth daily. 90 tablet 3   aspirin  EC 81 MG tablet Take 1 tablet (81 mg total) by mouth daily. Swallow whole.     budeson-glycopyrrolate-formoterol  (BREZTRI  AEROSPHERE) 160-9-4.8 MCG/ACT AERO inhaler Inhale 2 puffs into the lungs in the morning and at bedtime.     carvedilol  (COREG ) 3.125 MG tablet Take 1 tablet (3.125 mg total) by mouth 2 (two) times daily. 60 tablet 11   chlorpheniramine (CHLOR-TRIMETON) 4 MG tablet Take 4 mg by mouth 2 (two) times daily as needed for allergies.     Cholecalciferol (VITAMIN D3) 1000 units CAPS Take 1,000 Units by mouth daily.     DULoxetine  (CYMBALTA ) 60 MG capsule Take 60 mg by mouth daily.      famotidine (PEPCID) 40 MG tablet Take 40 mg by mouth daily.     gabapentin  (NEURONTIN ) 300 MG capsule Take 300 mg by mouth at bedtime.     HYDROcodone -acetaminophen  (NORCO/VICODIN) 5-325 MG tablet Take 1 tablet by mouth every 6 (six) hours as needed for moderate pain (pain score 4-6). 14 tablet 0   Iron-Vitamins (GERITOL PO) Take 1 tablet by mouth daily.     nitroGLYCERIN  (NITROSTAT ) 0.4 MG SL tablet Place 0.4 mg under the tongue every 5 (five) minutes as needed for chest pain.     pantoprazole  (PROTONIX ) 40 MG tablet Take 40 mg by mouth 2 (two) times daily.     rosuvastatin  (CRESTOR ) 10 MG tablet Take 1 tablet (10 mg total) by mouth daily. 90 tablet 3   triamterene -hydrochlorothiazide (MAXZIDE -25) 37.5-25 MG tablet Take 0.5 tablets by mouth daily. 45 tablet 2   No current facility-administered medications for this visit.    Allergies as of  10/04/2023 - Review Complete 09/10/2023  Allergen Reaction Noted   Crab extract Swelling 04/09/2015   Crab [shellfish allergy] Swelling 06/22/2012   Sulfa antibiotics Other (See Comments) 07/03/2014   Lipitor [atorvastatin ]  02/03/2013   Oxcarbazepine  03/09/2023   Sulfa drugs cross reactors  07/22/2010    Past Medical History:  Diagnosis Date   Acute systolic congestive heart failure (HCC) 03/17/2022   Aortic atherosclerosis (HCC) 03/03/2023   At risk for sleep apnea 12/19/2021   Bilateral carpal tunnel syndrome 10/27/2019   Breast cancer (HCC) 2008   left, with radiation finished 08, tamoxifen   Breast cancer (HCC) 2019   Rt. 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   Last Assessment & Plan:   Stable off meds for last month. Will monitor.  Last Assessment & Plan:   Stable on current meds, continue same.   Last Assessment & Plan:   Stable off meds for last month. Will monitor.     Familial hyperlipidemia 03/03/2023   Family history of breast cancer    Family history of colon cancer    Family history of ovarian cancer 07/21/2017   Gastro-esophageal reflux disease without esophagitis 01/24/2013   Overview:   Last Assessment & Plan:   Improved on prilosec .   Last Assessment & Plan:   Improved on  prilosec .   Last Assessment & Plan:   Improved on prilosec .   Last Assessment & Plan:   Improved on prilosec .     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  The Hereditary Gene Panel offered by [facility] includes sequencing and/or deletion duplication testing of the following 47 genes: APC, ATM, AXIN2, BARD1, BMPR1A, BRCA1, BRCA2, BRIP1, CDH1, CDK4, CDKN2A (p14ARF), CDKN2A (p16INK4a),    Hallux valgus (acquired), right foot 08/11/2016   Hand numbness 10/27/2019   HFrEF (heart failure with reduced ejection fraction) (HCC) 03/17/2022   Hot flashes related to aromatase inhibitor therapy 04/21/2018   Hyperlipidemia    Hypertension    IBS (irritable bowel syndrome)    IFG (impaired fasting glucose) 06/18/2021   Impacted cerumen of right ear 02/27/2019   Irritable larynx 06/12/2021   Loss of consciousness (HCC) 10/10/2021   Major depressive disorder, single episode 01/24/2013   Last Assessment & Plan:   Stable on zoloft , continue same.   Last Assessment & Plan:   Stable on zoloft , continue same.     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     RLS (restless legs syndrome)  07/30/2020   Routine general medical examination at a health care facility 06/22/2012   Severe obesity (BMI 35.0-35.9 with comorbidity) (HCC) 06/22/2022   Sicca laryngitis 06/12/2021   Tinnitus 08/07/2019   Trigeminal neuralgia of right side of face 01/13/2023   Right ear     Type 2 diabetes mellitus without complication, without long-term current use of insulin (HCC) 05/07/2022   Ulnar nerve entrapment at elbow, left 10/27/2019   Vertigo    Vitamin D  deficiency 08/12/2017    Past Surgical History:  Procedure Laterality Date   ABDOMINAL HYSTERECTOMY  1985   TAH (ovaries remain)   BREAST LUMPECTOMY Left 2007   BREAST LUMPECTOMY Right 2019   BREAST LUMPECTOMY WITH RADIOACTIVE SEED AND SENTINEL LYMPH NODE BIOPSY Right 07/22/2017   Procedure: BREAST LUMPECTOMY WITH RADIOACTIVE SEED AND SENTINEL LYMPH NODE BIOPSY;  Surgeon: Vanderbilt Ned, MD;  Location: [facility];  Service:  General;  Laterality: Right;   COLONOSCOPY     LEFT HEART CATH AND CORONARY ANGIOGRAPHY N/A 05/03/2023   Procedure: LEFT HEART CATH AND CORONARY ANGIOGRAPHY;  Surgeon: Mady Bruckner, MD;  Location: [facility];  Service: Cardiovascular;  Laterality: N/A;   TONSILLECTOMY     age 40    Review of Systems:    All systems reviewed and negative except where noted in HPI.    Physical Exam:  LMP 03/17/1983  Patient's last menstrual period was 03/17/1983.  General: Well-nourished, well-developed in no acute distress.  Lungs: Clear to auscultation bilaterally. Non-labored. Heart: Regular rate and rhythm, no murmurs rubs or gallops.  Abdomen: Bowel sounds are normal; Abdomen is Soft; No hepatosplenomegaly, masses or hernias;  No Abdominal Tenderness; No guarding or rebound tenderness. Neuro: Alert and oriented x 3.  Grossly intact.  Psych: Alert and cooperative, normal mood and affect.   Imaging Studies: CT ABDOMEN PELVIS W CONTRAST Result Date: 09/10/2023 CLINICAL DATA:  Acute nonlocalized  abdominal pain EXAM: CT ABDOMEN AND PELVIS WITH CONTRAST TECHNIQUE: Multidetector CT imaging of the abdomen and pelvis was performed using the standard protocol following bolus administration of intravenous contrast. RADIATION DOSE REDUCTION: This exam was performed according to the departmental dose-optimization program which includes automated exposure control, adjustment of the mA and/or kV according to patient size and/or use of iterative reconstruction technique. CONTRAST:  OMNIPAQUE  IOHEXOL  300 MG/ML  SOLN COMPARISON:  CT 10/22/2018 FINDINGS: Lower chest: No acute abnormality. Hepatobiliary: Unremarkable liver. Normal gallbladder. No biliary dilation. Pancreas: Unremarkable. Spleen: Unremarkable. Adrenals/Urinary Tract: Normal adrenal glands. Malrotated right kidney. No urinary calculi or hydronephrosis. Bladder is unremarkable. Stomach/Bowel: Normal caliber large and small bowel. No bowel wall thickening. The appendix is normal.Stomach is within normal limits. Vascular/Lymphatic: No significant vascular findings are present. No enlarged abdominal or pelvic lymph nodes. Reproductive: 3.8 cm left adnexal cyst, similar to slight increase in size since pelvic ultrasound 10/07/2020 when it measured 3.6 cm. Hysterectomy. No right adnexal mass. Other: No free intraperitoneal fluid or air. Musculoskeletal: No acute fracture. IMPRESSION: No acute abnormality in the abdomen or pelvis. Electronically Signed   By: Norman Gatlin M.D.   On: 09/10/2023 20:19    Labs: CBC    Component Value Date/Time   WBC 7.0 09/10/2023 1632   RBC 4.70 09/10/2023 1632   HGB 14.2 09/10/2023 1632   HGB 14.4 04/30/2023 1003   HGB 13.1 06/01/2011 1353   HCT 42.8 09/10/2023 1632   HCT 43.6 04/30/2023 1003   HCT 39.0 06/01/2011 1353   PLT 279 09/10/2023 1632   PLT 293 04/30/2023 1003   MCV 91.1 09/10/2023 1632   MCV 91 04/30/2023 1003   MCV 90.4 06/01/2011 1353   MCH 30.2 09/10/2023 1632   MCHC 33.2 09/10/2023 1632    RDW 12.3 09/10/2023 1632   RDW 12.6 04/30/2023 1003   RDW 12.3 06/01/2011 1353   LYMPHSABS 2.6 04/30/2023 1003   LYMPHSABS 2.1 06/01/2011 1353   MONOABS 0.5 01/26/2023 1131   MONOABS 0.5 06/01/2011 1353   EOSABS 0.2 04/30/2023 1003   BASOSABS 0.1 04/30/2023 1003   BASOSABS 0.1 06/01/2011 1353    CMP     Component Value Date/Time   NA 140 09/10/2023 1632   NA 141 05/20/2023 1054   K 4.0 09/10/2023 1632   CL 102 09/10/2023 1632   CO2 27 09/10/2023 1632   GLUCOSE 111 (H) 09/10/2023 1632   BUN 15 09/10/2023 1632   BUN 18 05/20/2023 1054  CREATININE 1.00 09/10/2023 1632   CREATININE 0.92 01/26/2023 1131   CREATININE 0.94 04/13/2013 1411   CALCIUM  9.9 09/10/2023 1632   PROT 7.4 09/10/2023 1632   PROT 6.4 03/03/2023 1133   ALBUMIN 4.4 09/10/2023 1632   ALBUMIN 4.4 03/03/2023 1133   AST 23 09/10/2023 1632   AST 15 01/26/2023 1131   ALT 23 09/10/2023 1632   ALT 13 01/26/2023 1131   ALKPHOS 105 09/10/2023 1632   BILITOT 0.6 09/10/2023 1632   BILITOT 0.6 03/03/2023 1133   BILITOT 0.6 01/26/2023 1131   GFRNONAA >60 09/10/2023 1632   GFRNONAA >60 01/26/2023 1131   GFRNONAA 68 01/24/2013 0942   GFRAA >60 08/07/2019 0850   GFRAA >60 08/23/2018 1006   GFRAA 79 01/24/2013 0942       Assessment and Plan:   Katelyn Porter is a 73 y.o. y/o female ***    Ellouise Console, PA-C  Follow up ***

## 2023-10-04 ENCOUNTER — Ambulatory Visit: Admitting: Physician Assistant

## 2023-10-04 ENCOUNTER — Encounter: Payer: Self-pay | Admitting: Physician Assistant

## 2023-10-04 VITALS — BP 118/58 | HR 95 | Ht 62.5 in | Wt 214.2 lb

## 2023-10-04 DIAGNOSIS — Z860101 Personal history of adenomatous and serrated colon polyps: Secondary | ICD-10-CM | POA: Diagnosis not present

## 2023-10-04 DIAGNOSIS — Z800 Family history of malignant neoplasm of digestive organs: Secondary | ICD-10-CM | POA: Diagnosis not present

## 2023-10-04 DIAGNOSIS — K219 Gastro-esophageal reflux disease without esophagitis: Secondary | ICD-10-CM | POA: Diagnosis not present

## 2023-10-04 DIAGNOSIS — K589 Irritable bowel syndrome without diarrhea: Secondary | ICD-10-CM

## 2023-10-04 MED ORDER — HYOSCYAMINE SULFATE 0.125 MG SL SUBL
0.1250 mg | SUBLINGUAL_TABLET | SUBLINGUAL | 3 refills | Status: AC | PRN
Start: 2023-10-04 — End: ?

## 2023-10-04 NOTE — Patient Instructions (Signed)
 We have sent the following medications to your pharmacy for you to pick up at your convenience: Hyoscyamine  0.125 mg three times daily as needed  Please follow up sooner if symptoms increase or worsen  Due to recent changes in healthcare laws, you may see the results of your imaging and laboratory studies on MyChart before your provider has had a chance to review them.  We understand that in some cases there may be results that are confusing or concerning to you. Not all laboratory results come back in the same time frame and the provider may be waiting for multiple results in order to interpret others.  Please give us  48 hours in order for your provider to thoroughly review all the results before contacting the office for clarification of your results.   Thank you for trusting me with your gastrointestinal care!   Ellouise Console, PA-C _______________________________________________________  If your blood pressure at your visit was 140/90 or greater, please contact your primary care physician to follow up on this.  _______________________________________________________  If you are age 72 or older, your body mass index should be between 23-30. Your Body mass index is 38.56 kg/m. If this is out of the aforementioned range listed, please consider follow up with your Primary Care Provider.  If you are age 72 or younger, your body mass index should be between 19-25. Your Body mass index is 38.56 kg/m. If this is out of the aformentioned range listed, please consider follow up with your Primary Care Provider.   ________________________________________________________  The [facility] providers would like to encourage you to use MYCHART to communicate with providers for non-urgent requests or questions.  Due to long hold times on the telephone, sending your provider a message by [facility] may be a faster and more efficient way to get a response.  Please allow 48 business hours for a response.  Please  remember that this is for non-urgent requests.  _______________________________________________________

## 2023-10-04 NOTE — Progress Notes (Signed)
 Tine please send this to Dr. Abran, if he is her primary GI MD. Thanks

## 2023-10-05 NOTE — Progress Notes (Signed)
 Noted

## 2023-10-15 ENCOUNTER — Ambulatory Visit
Admission: RE | Admit: 2023-10-15 | Discharge: 2023-10-15 | Disposition: A | Discharge status: Home / Self Care | Source: Ambulatory Visit | Attending: Internal Medicine | Admitting: Internal Medicine

## 2023-10-15 DIAGNOSIS — Z1231 Encounter for screening mammogram for malignant neoplasm of breast: Secondary | ICD-10-CM

## 2023-10-20 ENCOUNTER — Encounter (HOSPITAL_BASED_OUTPATIENT_CLINIC_OR_DEPARTMENT_OTHER): Payer: Self-pay | Admitting: Obstetrics & Gynecology

## 2023-10-20 ENCOUNTER — Ambulatory Visit (HOSPITAL_BASED_OUTPATIENT_CLINIC_OR_DEPARTMENT_OTHER)

## 2023-10-20 ENCOUNTER — Ambulatory Visit (HOSPITAL_BASED_OUTPATIENT_CLINIC_OR_DEPARTMENT_OTHER): Admitting: Obstetrics & Gynecology

## 2023-10-20 VITALS — BP 142/56 | HR 81 | Ht 62.5 in | Wt 211.0 lb

## 2023-10-20 DIAGNOSIS — D271 Benign neoplasm of left ovary: Secondary | ICD-10-CM

## 2023-10-20 DIAGNOSIS — Z1509 Genetic susceptibility to other malignant neoplasm: Secondary | ICD-10-CM

## 2023-10-20 DIAGNOSIS — Z853 Personal history of malignant neoplasm of breast: Secondary | ICD-10-CM

## 2023-10-20 DIAGNOSIS — N83202 Unspecified ovarian cyst, left side: Secondary | ICD-10-CM

## 2023-10-20 DIAGNOSIS — Z90710 Acquired absence of both cervix and uterus: Secondary | ICD-10-CM | POA: Diagnosis not present

## 2023-10-21 ENCOUNTER — Ambulatory Visit (HOSPITAL_BASED_OUTPATIENT_CLINIC_OR_DEPARTMENT_OTHER): Payer: Self-pay | Admitting: Obstetrics & Gynecology

## 2023-10-21 DIAGNOSIS — D271 Benign neoplasm of left ovary: Secondary | ICD-10-CM | POA: Insufficient documentation

## 2023-10-21 LAB — CA 125: Cancer Antigen (CA) 125: 13.3 U/mL (ref 0.0–38.1)

## 2023-10-21 NOTE — Progress Notes (Signed)
   Ultrasound f/u Patient name: Katelyn Porter MRN [id number]  Date of birth: 1951/02/22 Chief Complaint:   Results (US  10/20/23)  History of Present Illness:   Katelyn Porter is a 73 y.o. G1P0 African-American female being seen today for discussion of ultrasound findings.  Patient has an ATM gene mutation which carries a slight increased risk of ovarian cancer but it is less than 3% lifetime risk.  Therefore risk reducing BSO is not recommended.  This gene mutation was noted during genetic testing done due to history of breast cancer.    She has a known left ovarian cyst that has been followed conservatively.  Ultrasound in 2022 showed the cyst to measure 3.6 x 3.1 x 3.5 cm.  Then in 2023 it measured 3.7 x 3.0 x 3.4 cm.  Due to other medical issues last year, ultrasounds not performed.    Today ultrasound showed left ovary measuring 4.6 x 4.9 x 3.4 cm with the simple cyst measuring 4.0 x 4.3 x 3.0 cm.  This is enlarged from the prior ultrasound.  However she did have a CT in June of this year that showed the cyst to measure 3.8 cm which is only mildly changed.  She is really not interested in surgery.  We discussed checking a CA125 today and repeating the ultrasound in 4 to 6 months.  She does understand the long wait and know that this completely benign is to remove it but even if it has increased in size, it has not increased in a worrisome manner over the last 3 years.  Patient's last menstrual period was 03/17/1983.   Review of Systems:   Pertinent items are noted in HPI Denies any urinary or bowel changes or pelvic pain Pertinent History Reviewed:  Reviewed past medical,surgical, social and family history.  Reviewed problem list, medications and allergies. Physical Assessment:   Vitals:   10/20/23 1015  BP: (!) 142/56  Pulse: 81  SpO2: 99%  Weight: 211 lb (95.7 kg)  Height: 5' 2.5 (1.588 m)  Body mass index is 37.98 kg/m.        Physical Examination:   General appearance -  well appearing, and in no distress  Mental status - alert, oriented to person, place, and time  Psych:  She has a normal mood and affect  Assessment & Plan:  1. Ovarian benign neoplasm, left (Primary) -Will check CA125 today and plan to repeat ultrasound in 4 to 6 months.  Patient knows if she has any new pain or concerns, she needs to call. - CA 125 - US  PELVIS TRANSVAGINAL NON-OB (TV ONLY); Future   Orders Placed This Encounter  Procedures   US  PELVIS TRANSVAGINAL NON-OB (TV ONLY)   CA 125    Meds: No orders of the defined types were placed in this encounter.   Follow-up: Return in about 4 months (around 02/19/2024).  Total time with patient including documentation and prior imaging review: 24 minutes.  Ronal GORMAN Pinal, MD 10/21/2023 8:30 AM

## 2024-01-26 ENCOUNTER — Telehealth: Payer: Self-pay

## 2024-01-26 NOTE — Telephone Encounter (Signed)
 Spoke with patient and confirmed appointment on 11/13

## 2024-01-27 ENCOUNTER — Inpatient Hospital Stay: Payer: Medicare HMO | Attending: Hematology and Oncology | Admitting: Hematology and Oncology

## 2024-01-27 ENCOUNTER — Telehealth: Payer: Self-pay | Admitting: *Deleted

## 2024-01-27 VITALS — BP 130/44 | HR 81 | Temp 98.3°F | Resp 17 | Ht 62.5 in | Wt 215.2 lb

## 2024-01-27 DIAGNOSIS — Z808 Family history of malignant neoplasm of other organs or systems: Secondary | ICD-10-CM | POA: Insufficient documentation

## 2024-01-27 DIAGNOSIS — Z7982 Long term (current) use of aspirin: Secondary | ICD-10-CM | POA: Diagnosis not present

## 2024-01-27 DIAGNOSIS — Z1509 Genetic susceptibility to other malignant neoplasm: Secondary | ICD-10-CM | POA: Insufficient documentation

## 2024-01-27 DIAGNOSIS — Z1732 Human epidermal growth factor receptor 2 negative status: Secondary | ICD-10-CM | POA: Insufficient documentation

## 2024-01-27 DIAGNOSIS — Z801 Family history of malignant neoplasm of trachea, bronchus and lung: Secondary | ICD-10-CM | POA: Diagnosis not present

## 2024-01-27 DIAGNOSIS — Z1721 Progesterone receptor positive status: Secondary | ICD-10-CM | POA: Insufficient documentation

## 2024-01-27 DIAGNOSIS — Z1501 Genetic susceptibility to malignant neoplasm of breast: Secondary | ICD-10-CM | POA: Diagnosis not present

## 2024-01-27 DIAGNOSIS — Z79811 Long term (current) use of aromatase inhibitors: Secondary | ICD-10-CM | POA: Diagnosis not present

## 2024-01-27 DIAGNOSIS — Z803 Family history of malignant neoplasm of breast: Secondary | ICD-10-CM | POA: Diagnosis not present

## 2024-01-27 DIAGNOSIS — Z8041 Family history of malignant neoplasm of ovary: Secondary | ICD-10-CM | POA: Diagnosis not present

## 2024-01-27 DIAGNOSIS — Z1502 Genetic susceptibility to malignant neoplasm of ovary: Secondary | ICD-10-CM | POA: Insufficient documentation

## 2024-01-27 DIAGNOSIS — Z170 Estrogen receptor positive status [ER+]: Secondary | ICD-10-CM | POA: Diagnosis not present

## 2024-01-27 DIAGNOSIS — C50411 Malignant neoplasm of upper-outer quadrant of right female breast: Secondary | ICD-10-CM | POA: Diagnosis not present

## 2024-01-27 DIAGNOSIS — Z79899 Other long term (current) drug therapy: Secondary | ICD-10-CM | POA: Diagnosis not present

## 2024-01-27 DIAGNOSIS — Z800 Family history of malignant neoplasm of digestive organs: Secondary | ICD-10-CM | POA: Diagnosis not present

## 2024-01-27 MED ORDER — ANASTROZOLE 1 MG PO TABS: 1.0000 mg | ORAL_TABLET | Freq: Every day | ORAL | 3 refills | Status: AC

## 2024-01-27 NOTE — Progress Notes (Signed)
 [facility]  [phone] [phone]     ID: Maurilio Roseline Edison DOB: 12-27-1950  MR#: [id number]  RDW#:[id number]  Patient Care Team: Juliene Asberry NOVAK, DO as PCP - General (Internal Medicine) Cleotilde Ronal RAMAN, MD as Attending Physician (Obstetrics and Gynecology) Vanderbilt Ned, MD as Consulting Physician (General Surgery) Shannon Agent, MD as Consulting Physician (Radiation Oncology) Abran Norleen SAILOR, MD as Consulting Physician (Gastroenterology) Loretha Ash, MD as Consulting Physician (Hematology and Oncology) OTHER MD:  CHIEF COMPLAINT: 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  History of Present Illness    Discussed the use of AI scribe software for clinical note transcription with the patient, who gave verbal consent to proceed.  History of Present Illness Katelyn Porter is a 73 year old female with a history of breast cancer who presents with persistent cough and hoarseness.  She has experienced a persistent cough for over a year, which has not improved despite previous evaluations and treatments. She initially received care from Dr. Jesus,  she says she was not satisfied with the visit and she didn't feel like they listened to her. She has since sought care outside of [facility] and [facility] and is currently under the care of an ENT specialist in [place].  A CT scan and scope performed by the ENT revealed an abnormality in the right tonsillar area, and a biopsy is scheduled for February 07, 2024. Despite normal vocal cord assessments, her voice remains hoarse and frothy. She also experiences ear discomfort, particularly in one ear, which triggers coughing when cleaned with a washcloth.  She has a history of  breast cancer diagnosed in 2019 and is currently taking anastrozole , nearing the end of her prescription. She has an ATM gene mutation and has been undergoing MRIs.   Rest of the pertinent 10 point ROS reviewed and negative.    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  She was treated with surgery radiation and tamoxifen for 5 years and we released her from follow-up in 2013.  More recently she had routine screening mammography, on 06/16/2017 showing a possible abnormality in the right breast. She underwent unilateral right breast diagnostic mammography with tomography and right breast ultrasonography at [facility] on 06/21/2017 showing breast density category B. Suspicious new mass in the right breast 9 o'clock central and 4 cm from the nipple measuring 0.8 x 0.6 x 0.7 cm. There was no axillary adenopathy sonographically.   Accordingly on 06/22/2017 she proceeded to biopsy of the right breast area in question. The pathology from this procedure showed ([id number]): Breast, right, needle core biopsy, 9:15 o'clock, UOQ with invasive ductal carcinoma with extracellular mucin, grade 2. Prognostic indicators significant for: estrogen receptor, 100% positive and progesterone receptor, 5% positive, both with strong staining intensity. Proliferation marker Ki67 at 20%. HER2 negative.  The patient's subsequent history is as detailed below.   PAST MEDICAL HISTORY: Past Medical History:  Diagnosis Date   Acute systolic congestive heart failure (HCC) 03/17/2022   Aortic atherosclerosis 03/03/2023   At risk for sleep apnea 12/19/2021   Bilateral carpal tunnel syndrome 10/27/2019   Breast cancer (  HCC) 2008   left, with radiation finished 08, tamoxifen   Breast cancer (HCC) 2019   Rt. 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   Last Assessment & Plan:   Stable off meds for last month. Will monitor.  Last Assessment & Plan:   Stable on current meds, continue same.   Last Assessment & Plan:   Stable off meds for last month. Will monitor.     Familial hyperlipidemia 03/03/2023   Family history of breast cancer    Family history of colon cancer    Family history of ovarian cancer 07/21/2017   Gastro-esophageal reflux disease without esophagitis 01/24/2013   Overview:   Last Assessment & Plan:   Improved on prilosec .   Last Assessment & Plan:   Improved on prilosec .   Last Assessment & Plan:   Improved on prilosec .   Last Assessment & Plan:   Improved on prilosec .     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  The Hereditary Gene Panel offered by [facility] includes sequencing and/or deletion duplication testing of the following 47 genes: APC, ATM, AXIN2, BARD1, BMPR1A, BRCA1, BRCA2, BRIP1, CDH1, CDK4, CDKN2A (p14ARF), CDKN2A (p16INK4a),    Hallux valgus (acquired), right foot 08/11/2016   Hand numbness  10/27/2019   HFrEF (heart failure with reduced ejection fraction) (HCC) 03/17/2022   Hot flashes related to aromatase inhibitor therapy 04/21/2018   Hyperlipidemia    Hypertension    IBS (irritable bowel syndrome)    IFG (impaired fasting glucose) 06/18/2021   Impacted cerumen of right ear 02/27/2019   Irritable larynx 06/12/2021   Loss of consciousness (HCC) 10/10/2021   Major depressive disorder, single episode 01/24/2013   Last Assessment & Plan:   Stable on zoloft , continue same.   Last Assessment & Plan:   Stable on zoloft , continue same.     Malignant neoplasm of upper-outer quadrant of right breast in female, estrogen receptor positive (HCC) 06/29/2017   Monoallelic mutation of ATM gene 88/82/7979   NSVT (nonsustained ventricular tachycardia) (HCC) 03/11/2021   Obesity (BMI 35.0-39.9 without comorbidity) 03/03/2023   Other sites of candidiasis 05/25/2022   Other spondylosis with radiculopathy, lumbar region 06/26/2020   Personal history of radiation therapy 2007   left breast   Personal history of radiation therapy 2019   right breast   Recurrent major depressive disorder, in partial remission 01/24/2013   Last Assessment & Plan:   Stable on zoloft , continue same.     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/2021   Tinnitus 08/07/2019   Trigeminal neuralgia of right side of face 01/13/2023   Right ear     Type 2 diabetes mellitus without complication, without long-term current use of insulin (HCC) 05/07/2022   Ulnar nerve entrapment at elbow, left 10/27/2019   Vertigo    Vitamin D  deficiency 08/12/2017    PAST SURGICAL HISTORY: Past Surgical History:  Procedure Laterality Date   ABDOMINAL HYSTERECTOMY  1985   TAH (ovaries remain)   BREAST LUMPECTOMY Left 2007   BREAST LUMPECTOMY Right 2019   BREAST  LUMPECTOMY WITH RADIOACTIVE SEED AND SENTINEL LYMPH NODE BIOPSY Right 07/22/2017   Procedure: BREAST LUMPECTOMY WITH RADIOACTIVE SEED AND SENTINEL LYMPH NODE BIOPSY;  Surgeon: Vanderbilt Ned, MD;  Location: [facility];  Service: General;  Laterality: Right;   COLONOSCOPY     LEFT HEART CATH AND CORONARY ANGIOGRAPHY N/A 05/03/2023   Procedure: LEFT HEART CATH AND CORONARY ANGIOGRAPHY;  Surgeon: Mady Bruckner, MD;  Location: [facility];  Service: Cardiovascular;  Laterality: N/A;   TONSILLECTOMY     age 47    FAMILY HISTORY Family History  Problem Relation Age of Onset   Colon cancer Mother 49   Hypertension Mother    Kidney disease Mother    Diabetes Mother    Lung cancer Father        Died at 58 from lung cancer   Heart disease Father        heart attack   Allergic rhinitis Sister    Hypertension Sister    Diabetes Sister    Colon polyps Sister    Asthma Brother    Coronary artery disease Brother 41       died of massive heart attack   Diabetes Brother    Breast cancer Maternal Aunt        dx in her 76s   Ovarian cancer Paternal Grandmother    Colon cancer Cousin        maternal 1st cousin dx over 77   Breast cancer Cousin        mat 1st cousin dx in her 49s   Esophageal cancer Neg Hx    Stomach cancer Neg Hx    Rectal cancer Neg Hx    Pancreatic cancer Neg Hx    Liver disease Neg Hx   The patient's father had a history of tongue cancer which apparently spread to his lungs according to the patient. He passed due to a MI at age 33. The patient's mother died at age 27 due to renal failure and diabetes.  She also had a history of colon cancer. The patient had 1 brother who died from an MI; and 1 sister.  There was a maternal aunt diagnosed with breast cancer in the 76's. A maternal cousin passed due to breast cancer. Another maternal cousin had colon cancer. She denies a family history of ovarian cancer.    GYNECOLOGIC HISTORY:  Patient's last  menstrual period was 03/17/1983. Menarche: 73 years old. She is GXP0. Her LMP was age 45 after her hysterectomy without BSO. She took HRT until 2008, discontinued at the time of her initial breast cancer diagnosis.    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 Her husband, Beverley, worked in [employer] before retiring. He passed away 2018/01/23 from a hemorraghic stroke. He had 2 children from a prior marriage, a daughter Shawnee,  who lives in [place] and works for [employer], and a son, Lavar, who works for the [employer] in [place] , [employer]. The patient has 3 step-grandchildren, 1 of whom is now a publishing rights manager.. She now lives alone. She attends [employer].     ADVANCED DIRECTIVES: Not in place.  At the 01/18/2020 visit the patient was given the appropriate documents to complete and notarized at her discretion.   HEALTH MAINTENANCE: Social History   Tobacco Use   Smoking status: Never   Smokeless tobacco: Never  Vaping Use   Vaping status: Never Used  Substance Use Topics   Alcohol use: No   Drug use: No     Colonoscopy: UTD/Dr. Perry/ normal  PAP: s/p TAHBSO  Bone density: 02/2019, +1.9   Allergies  Allergen Reactions   Crab Extract Swelling    Blisters on tongue.   Crab [Shellfish Allergy] Swelling    Blisters on tongue.   Sulfa Antibiotics Other (See Comments)    headache   Lipitor [Atorvastatin ]     Muscle cramping   Oxcarbazepine     Other Reaction(s): GI Intolerance   Sulfa Drugs Cross Reactors     Current Outpatient Medications  Medication Sig Dispense Refill   albuterol  (VENTOLIN  HFA) 108 (90 Base) MCG/ACT inhaler Inhale 2 puffs into the lungs every 4 (four) hours as needed.     anastrozole  (ARIMIDEX ) 1 MG tablet Take 1 tablet (1 mg total) by mouth daily. 90 tablet 3   aspirin  EC 81 MG tablet Take 1 tablet (81 mg total) by mouth daily. Swallow  whole.     carvedilol  (COREG ) 3.125 MG tablet Take 1 tablet (3.125 mg total) by mouth 2 (two) times daily. 60 tablet 11   chlorpheniramine (CHLOR-TRIMETON) 4 MG tablet Take 4 mg by mouth 2 (two) times daily as needed for allergies.     Cholecalciferol (VITAMIN D3) 1000 units CAPS Take 1,000 Units by mouth daily.     DULoxetine  (CYMBALTA ) 60 MG capsule Take 60 mg by mouth daily.      famotidine (PEPCID) 40 MG tablet Take 40 mg by mouth daily. (Patient not taking: Reported on 10/04/2023)     fluticasone  (FLONASE ) 50 MCG/ACT nasal spray Place 1 spray into both nostrils daily.     gabapentin  (NEURONTIN ) 100 MG capsule Take 100 mg by mouth at bedtime.     gabapentin  (NEURONTIN ) 300 MG capsule Take 300 mg by mouth at bedtime.     hyoscyamine  (LEVSIN  SL) 0.125 MG SL tablet Place 1 tablet (0.125 mg total) under the tongue every 4 (four) hours as needed. 30 tablet 3   nitroGLYCERIN  (NITROSTAT ) 0.4 MG SL tablet Place 0.4 mg under the tongue every 5 (five) minutes as needed for chest pain.     omeprazole  (PRILOSEC ) 20 MG capsule Take 20 mg by mouth 2 (two) times daily before a meal.     rosuvastatin  (CRESTOR ) 10 MG tablet Take 1 tablet (10 mg total) by mouth daily. 90 tablet 3   triamterene -hydrochlorothiazide (MAXZIDE -25) 37.5-25 MG tablet Take 0.5 tablets by mouth daily. 45 tablet 2   No current facility-administered medications for this visit.    OBJECTIVE: African-American woman who appears well  Vitals:   01/27/24 1256  BP: (!) 130/44  Pulse: 81  Resp: 17  Temp: 98.3 F (36.8 C)  SpO2: 96%       Body mass index is 38.73 kg/m.   Wt Readings from Last 3 Encounters:  01/27/24 215 lb 3.2 oz (  97.6 kg)  10/20/23 211 lb (95.7 kg)  10/04/23 214 lb 4 oz (97.2 kg)     ECOG FS:1 - Symptomatic but completely ambulatory  Physical Exam Constitutional:      Appearance: Normal appearance.  Chest:     Comments: Bilateral breasts inspected. No palpable masses or regional  adenopathy Musculoskeletal:     Cervical back: Normal range of motion. No rigidity.  Lymphadenopathy:     Cervical: No cervical adenopathy.  Neurological:     Mental Status: She is alert.       LAB RESULTS:  CMP     Component Value Date/Time   NA 140 09/10/2023 1632   NA 141 05/20/2023 1054   K 4.0 09/10/2023 1632   CL 102 09/10/2023 1632   CO2 27 09/10/2023 1632   GLUCOSE 111 (H) 09/10/2023 1632   BUN 15 09/10/2023 1632   BUN 18 05/20/2023 1054   CREATININE 1.00 09/10/2023 1632   CREATININE 0.92 01/26/2023 1131   CREATININE 0.94 04/13/2013 1411   CALCIUM  9.9 09/10/2023 1632   PROT 7.4 09/10/2023 1632   PROT 6.4 03/03/2023 1133   ALBUMIN 4.4 09/10/2023 1632   ALBUMIN 4.4 03/03/2023 1133   AST 23 09/10/2023 1632   AST 15 01/26/2023 1131   ALT 23 09/10/2023 1632   ALT 13 01/26/2023 1131   ALKPHOS 105 09/10/2023 1632   BILITOT 0.6 09/10/2023 1632   BILITOT 0.6 03/03/2023 1133   BILITOT 0.6 01/26/2023 1131   GFRNONAA >60 09/10/2023 1632   GFRNONAA >60 01/26/2023 1131   GFRNONAA 68 01/24/2013 0942   GFRAA >60 08/07/2019 0850   GFRAA >60 08/23/2018 1006   GFRAA 79 01/24/2013 0942    No results found for: TOTALPROTELP, ALBUMINELP, A1GS, A2GS, BETS, BETA2SER, GAMS, MSPIKE, SPEI  No results found for: KPAFRELGTCHN, LAMBDASER, KAPLAMBRATIO  Lab Results  Component Value Date   WBC 7.0 09/10/2023   NEUTROABS 2.8 04/30/2023   HGB 14.2 09/10/2023   HCT 42.8 09/10/2023   MCV 91.1 09/10/2023   PLT 279 09/10/2023    Lab Results  Component Value Date   LABCA2 30 07/28/2010    No components found for: [id number]  No results for input(s): INR in the last 168 hours.  Lab Results  Component Value Date   LABCA2 30 07/28/2010    No results found for: CAN199  Lab Results  Component Value Date   CAN125 13.3 10/20/2023    No results found for: CAN153  No results found for: CA2729  No components found for: HGQUANT  No  results found for: CEA1, CEA / No results found for: CEA1, CEA   No results found for: AFPTUMOR  No results found for: CHROMOGRNA  No results found for: HGBA, HGBA2QUANT, HGBFQUANT, HGBSQUAN (Hemoglobinopathy evaluation)   No results found for: LDH  No results found for: IRON, TIBC, IRONPCTSAT (Iron and TIBC)  No results found for: FERRITIN  Urinalysis    Component Value Date/Time   COLORURINE YELLOW 09/10/2023 1640   APPEARANCEUR CLEAR 09/10/2023 1640   LABSPEC 1.017 09/10/2023 1640   PHURINE 6.0 09/10/2023 1640   GLUCOSEU NEGATIVE 09/10/2023 1640   HGBUR NEGATIVE 09/10/2023 1640   BILIRUBINUR NEGATIVE 09/10/2023 1640   BILIRUBINUR Negative 07/07/2022 1441   KETONESUR NEGATIVE 09/10/2023 1640   PROTEINUR NEGATIVE 09/10/2023 1640   UROBILINOGEN 0.2 07/07/2022 1441   NITRITE NEGATIVE 09/10/2023 1640   LEUKOCYTESUR SMALL (A) 09/10/2023 1640    STUDIES: No results found.   ELIGIBLE FOR AVAILABLE RESEARCH PROTOCOL: no  ASSESSMENT: 73 y.o. [place] , [place] woman status post left lumpectomy February 2008 for a grade 3 ductal carcinoma in situ which was strongly estrogen and progesterone receptor positive.  (a)   adjuvant radiation completed May 2008  (b)  on tamoxifen July 2008 through March 2013.  (1) status post right breast upper outer quadrant biopsy 06/22/2017 for a clinnical T1b N0, stage IA invasive ductal carcinoma, with extracellular mucin; grade 2; estrogen and progesterone receptor positive, HER-2 not amplified, with an MIB-1 of 20%.  (2) Genetics testing on 07/08/2017 through [facility]'s Multi- Cancer Panel showed a Pathogenic variant  in ATM (c.7913G>A (p.Trp2638*)  (a)  VUS identified in DICER1 and POLE  (b) no additional deleterious mutations were noted in APC, ATM, AXIN2, BARD1, BMPR1A, BRCA1, BRCA2, BRIP1, CDH1, CDK4, CDKN2A (p14ARF), CDKN2A (p16INK4a), CHEK2, CTNNA1, DICER1, EPCAM*, GREM1*, KIT, MEN1, MLH1, MSH2, MSH3, MSH6,  MUTYH, NBN, NF1, PALB2, PDGFRA, PMS2, POLD1, POLE, PTEN, RAD50, RAD51C, RAD51D, SDHB, SDHC, SDHD, SMAD4, SMARCA4, STK11, TP53, TSC1, TSC2, VHL. The following genes were evaluated for sequence changes only: HOXB13*, NTHL1*, SDHA   (c) patients with ATM mutations are at increased risk of breast cancer potential increase in ovarian cancer and pancreatic cancer risk  (3) Oncotype obtained from the 06/22/2017 biopsy showed a score of 25, predicting a risk of recurrence outside the breast over the next 9 years of 12% if the patient took antiestrogens for 5 years.  It also shows no benefit from chemotherapy.  (4) status post right lumpectomy and sentinel lymph node sampling 07/22/2017 for a pT1b pN0, stage Ia invasive ductal carcinoma, grade 2, with extracellular mucin, and negative margins.  (a) a total of 3 lymph nodes were removed  (5) adjuvant radiation 08/31/2017 to 10/19/2017  Site/dose:    1. The Right breast was treated to 50.4 Gy in 28 fractions of 1.8 Gy. 2. The Right breast was boosted to 10 Gy in 5 fractions of 2 Gy.  (6) started anastrozole  11/14/2017  (a) bone density December 2020  (7) intensified screening:   (a) breast MRI December 2020, repeat Fall 2021 and yearly  (b) mammography 06/26/2019, repeat Spring 2022 and yearly  (8) ovarian cancer screening: per Dr Cleotilde   PLAN:  Assessment and Plan Assessment & Plan Right tonsillar area mass with chronic cough and hoarseness, pending biopsy Chronic cough and hoarseness persist. CT scan and scope revealed a mass in the right tonsillar area. Biopsy scheduled for November 24th. - Proceed with scheduled biopsy of right tonsillar area on November 24th. - Continue follow-up with ENT for further evaluation and management.  History of stage IA right breast cancer, status post surgery with persistent seroma, on adjuvant anastrozole  therapy Stage IA right breast cancer, status post surgery with persistent seroma. Currently on adjuvant  anastrozole  therapy. Discussed continuation of anastrozole  for up to seven years from diagnosis in 2019. She prefers to continue for one more year. - Continue anastrozole  therapy for one more year. - Refilled anastrozole  prescription at pharmacy.  ATM gene mutation with increased breast cancer risk, surveillance planning ATM gene mutation with increased breast cancer risk. Transitioning from MRI to contrast mammogram for surveillance due to cost and frequency considerations. Contrast mammogram is nearly as effective as MRI for surveillance.. - Ordered contrast mammogram for August next year. - Monitor for insurance changes affecting imaging options.   Total time spent: 30 minutes including History, physical exam, review of records, counseling and coordination of care.  *Total Encounter Time as defined by the Centers  for Medicare and Medicaid Services includes, in addition to the face-to-face time of a patient visit (documented in the note above) non-face-to-face time: obtaining and reviewing outside history, ordering and reviewing medications, tests or procedures, care coordination (communications with other health care professionals or caregivers) and documentation in the medical record.

## 2024-01-27 NOTE — Telephone Encounter (Signed)
 Email sent to [facility] for contrast enhanced mammogram order.

## 2024-02-07 HISTORY — PX: OTHER SURGICAL HISTORY: SHX169

## 2024-02-16 ENCOUNTER — Ambulatory Visit (HOSPITAL_BASED_OUTPATIENT_CLINIC_OR_DEPARTMENT_OTHER): Payer: Self-pay | Admitting: Obstetrics & Gynecology

## 2024-02-16 ENCOUNTER — Encounter (HOSPITAL_BASED_OUTPATIENT_CLINIC_OR_DEPARTMENT_OTHER): Payer: Self-pay | Admitting: Obstetrics & Gynecology

## 2024-02-16 ENCOUNTER — Other Ambulatory Visit (HOSPITAL_BASED_OUTPATIENT_CLINIC_OR_DEPARTMENT_OTHER)

## 2024-02-16 DIAGNOSIS — Z853 Personal history of malignant neoplasm of breast: Secondary | ICD-10-CM | POA: Diagnosis not present

## 2024-02-16 DIAGNOSIS — Z1501 Genetic susceptibility to malignant neoplasm of breast: Secondary | ICD-10-CM

## 2024-02-16 DIAGNOSIS — Z1509 Genetic susceptibility to other malignant neoplasm: Secondary | ICD-10-CM | POA: Diagnosis not present

## 2024-02-16 DIAGNOSIS — N83202 Unspecified ovarian cyst, left side: Secondary | ICD-10-CM

## 2024-02-16 DIAGNOSIS — D271 Benign neoplasm of left ovary: Secondary | ICD-10-CM

## 2024-02-16 NOTE — Progress Notes (Signed)
° °  Ultrasound f/u Patient name: Katelyn Porter MRN [id number]  Date of birth: 1950/06/30 Chief Complaint:   Follow-up  History of Present Illness:   Katelyn Porter is a 73 y.o. G1P0 African-American female being seen today for discussion of ultrasound findings.  Has hx of breast cancer with ATM gene mutation.  Has h/o of simple ovarian cyst that has been watched conservatively.  Denies pain or urinary changes.  Left ovary 4.6 x 3.5 x 4.8cm with simple avascular cyst noted measuring 4.2 x 3.0 x 3.8cm.  Compared to 10/20/2023, cyst is stable as it measured 4.0 x 4.3 x 3.0cm at that time.  In 2022 the cyst measured 3.2 x 2.7 x 3.4cm so it has increased a small amount.  Pt aware this is consistent with benign process.  Given ATM gene mutation, will recheck 1 year.    Unrelated, had screening for pancreatic cancer and there was abnormal signal at tonsillar region.  Biopsy showed tonsil tissue.  Reports she has tonsils removed as a kid.  Has follow up scheduled to discussed plan of care.    Patient's last menstrual period was 03/17/1983.  Last papa:  h/o hysterectomy  Review of Systems:   Pertinent items are noted in HPI Pertinent History Reviewed:  Reviewed past medical,surgical, social and family history.  Reviewed problem list, medications and allergies. Physical Assessment:   Vitals:   02/16/24 1438  BP: (!) 139/53  Pulse: 77  SpO2: 100%  Weight: 213 lb 9.6 oz (96.9 kg)  Body mass index is 38.45 kg/m.        Physical Examination:   General appearance - well appearing, and in no distress  Mental status - alert, oriented to person, place, and time  Psych:  She has a normal mood and affect  Assessment & Plan:  1. Ovarian benign neoplasm, left (Primary) - repeat ultrasound 1 year.  Reminder placed.    2. ATM gene mutation positive - current recommendations do not recommend risk reducing oophorectomy  3. History of breast cancer - followed by Dr. Loretha   Meds: No orders of  the defined types were placed in this encounter.   Follow-up: Return in about 1 year (around 02/15/2025).  Ronal GORMAN Pinal, MD 02/23/2024 1:05 PM GYNECOLOGY  VISIT

## 2024-02-17 ENCOUNTER — Other Ambulatory Visit: Payer: Self-pay | Admitting: Cardiology

## 2025-02-02 ENCOUNTER — Inpatient Hospital Stay: Admitting: Hematology and Oncology
